# Patient Record
Sex: Male | Born: 1955 | Race: White | Hispanic: No | Marital: Married | Smoking: Current every day smoker
Health system: Southern US, Community
[De-identification: ages and names within clinical notes are randomized; demographics above are authoritative.]

## PROBLEM LIST (undated history)

## (undated) DIAGNOSIS — F329 Major depressive disorder, single episode, unspecified: Secondary | ICD-10-CM

## (undated) DIAGNOSIS — F32A Depression, unspecified: Secondary | ICD-10-CM

## (undated) DIAGNOSIS — J449 Chronic obstructive pulmonary disease, unspecified: Secondary | ICD-10-CM

## (undated) DIAGNOSIS — F419 Anxiety disorder, unspecified: Secondary | ICD-10-CM

## (undated) DIAGNOSIS — R12 Heartburn: Secondary | ICD-10-CM

## (undated) DIAGNOSIS — R569 Unspecified convulsions: Secondary | ICD-10-CM

## (undated) DIAGNOSIS — M1990 Unspecified osteoarthritis, unspecified site: Secondary | ICD-10-CM

## (undated) HISTORY — DX: Heartburn: R12

## (undated) HISTORY — DX: Major depressive disorder, single episode, unspecified: F32.9

## (undated) HISTORY — DX: Anxiety disorder, unspecified: F41.9

## (undated) HISTORY — DX: Unspecified osteoarthritis, unspecified site: M19.90

## (undated) HISTORY — DX: Unspecified convulsions: R56.9

## (undated) HISTORY — DX: Depression, unspecified: F32.A

---
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
## 2004-04-06 ENCOUNTER — Emergency Department: Payer: Self-pay | Admitting: Unknown Physician Specialty

## 2004-08-31 ENCOUNTER — Emergency Department: Payer: Self-pay | Admitting: Emergency Medicine

## 2005-09-21 ENCOUNTER — Emergency Department: Payer: Self-pay | Admitting: Emergency Medicine

## 2005-10-11 ENCOUNTER — Emergency Department: Payer: Self-pay | Admitting: Emergency Medicine

## 2006-01-29 ENCOUNTER — Emergency Department: Payer: Self-pay | Admitting: Emergency Medicine

## 2006-05-21 ENCOUNTER — Emergency Department: Payer: Self-pay

## 2007-09-03 ENCOUNTER — Emergency Department: Payer: Self-pay

## 2008-04-06 ENCOUNTER — Emergency Department: Payer: Self-pay | Admitting: Emergency Medicine

## 2009-08-06 ENCOUNTER — Emergency Department: Payer: Self-pay | Admitting: Emergency Medicine

## 2009-12-28 ENCOUNTER — Emergency Department: Payer: Self-pay | Admitting: Emergency Medicine

## 2010-03-15 ENCOUNTER — Emergency Department: Payer: Self-pay | Admitting: Unknown Physician Specialty

## 2011-01-21 LAB — COMPREHENSIVE METABOLIC PANEL
Anion Gap: 8 (ref 7–16)
BUN: 4 mg/dL — ABNORMAL LOW (ref 7–18)
Bilirubin,Total: 0.8 mg/dL (ref 0.2–1.0)
Chloride: 103 mmol/L (ref 98–107)
Co2: 26 mmol/L (ref 21–32)
Creatinine: 1.03 mg/dL (ref 0.60–1.30)
EGFR (African American): >60
EGFR (Non-African Amer.): >60
Glucose: 82 mg/dL (ref 65–99)
Osmolality: 270 (ref 275–301)
SGOT(AST): 40 U/L — ABNORMAL HIGH (ref 15–37)
SGPT (ALT): 32 U/L
Total Protein: 7.6 g/dL (ref 6.4–8.2)

## 2011-01-21 LAB — CBC
HCT: 48.9 % (ref 40.0–52.0)
MCV: 103 fL — ABNORMAL HIGH (ref 80–100)

## 2011-01-22 ENCOUNTER — Inpatient Hospital Stay: Payer: Self-pay | Admitting: Internal Medicine

## 2011-01-22 LAB — LIPID PANEL
Cholesterol: 126 mg/dL (ref 0–200)
Ldl Cholesterol, Calc: 53 mg/dL (ref 0–100)
VLDL Cholesterol, Calc: 18 mg/dL (ref 5–40)

## 2011-01-22 LAB — CK TOTAL AND CKMB (NOT AT ARMC)
CK, Total: 47 U/L (ref 35–232)
CK-MB: <0.5 ng/mL — ABNORMAL LOW (ref 0.5–3.6)
CK-MB: <0.5 ng/mL — ABNORMAL LOW (ref 0.5–3.6)

## 2011-01-22 LAB — HEMOGLOBIN A1C: Hemoglobin A1C: 5.2 % (ref 4.2–6.3)

## 2011-01-22 LAB — TSH: Thyroid Stimulating Horm: 2.55 u[IU]/mL

## 2011-07-15 ENCOUNTER — Emergency Department: Payer: Self-pay | Admitting: Emergency Medicine

## 2011-07-15 LAB — ETHANOL: Ethanol %: <0.003 % (ref 0.000–0.080)

## 2011-07-15 LAB — CBC
HCT: 45.7 %
HGB: 16 g/dL
MCH: 36.3 pg — ABNORMAL HIGH
MCHC: 34.9 g/dL
MCV: 104 fL — ABNORMAL HIGH
Platelet: 257 10*3/uL
RBC: 4.4 x10 6/mm 3
RDW: 12.6 %
WBC: 5.6 10*3/uL

## 2011-07-15 LAB — COMPREHENSIVE METABOLIC PANEL
Albumin: 3.8 g/dL (ref 3.4–5.0)
Alkaline Phosphatase: 87 U/L (ref 50–136)
Bilirubin,Total: 0.6 mg/dL (ref 0.2–1.0)
Chloride: 104 mmol/L (ref 98–107)
Co2: 29 mmol/L (ref 21–32)
Creatinine: 1.35 mg/dL — ABNORMAL HIGH (ref 0.60–1.30)
Potassium: 4.4 mmol/L (ref 3.5–5.1)
SGOT(AST): 29 U/L (ref 15–37)
SGPT (ALT): 22 U/L
Total Protein: 7.8 g/dL (ref 6.4–8.2)

## 2011-07-15 LAB — APTT: Activated PTT: 28.6 s

## 2011-07-15 LAB — PROTIME-INR
INR: 0.9
Prothrombin Time: 12 s

## 2011-09-03 ENCOUNTER — Emergency Department: Payer: Self-pay | Admitting: Internal Medicine

## 2012-05-15 ENCOUNTER — Ambulatory Visit: Payer: Self-pay

## 2012-12-17 ENCOUNTER — Emergency Department: Payer: Self-pay | Admitting: Emergency Medicine

## 2012-12-17 LAB — COMPREHENSIVE METABOLIC PANEL
Albumin: 3.4 g/dL (ref 3.4–5.0)
Alkaline Phosphatase: 102 U/L
Anion Gap: 6 — ABNORMAL LOW (ref 7–16)
BUN: 5 mg/dL — ABNORMAL LOW (ref 7–18)
Bilirubin,Total: 0.6 mg/dL (ref 0.2–1.0)
Calcium, Total: 9.5 mg/dL (ref 8.5–10.1)
Chloride: 99 mmol/L (ref 98–107)
Creatinine: 0.95 mg/dL (ref 0.60–1.30)
EGFR (Non-African Amer.): >60
Potassium: 3.3 mmol/L — ABNORMAL LOW (ref 3.5–5.1)
SGOT(AST): 34 U/L (ref 15–37)
SGPT (ALT): 20 U/L (ref 12–78)

## 2012-12-17 LAB — CBC WITH DIFFERENTIAL/PLATELET
Eosinophil #: 0 10*3/uL (ref 0.0–0.7)
Eosinophil %: 0.3 %
HGB: 15.8 g/dL (ref 13.0–18.0)
Lymphocyte #: 0.5 10*3/uL — ABNORMAL LOW (ref 1.0–3.6)
Lymphocyte %: 5.8 %
Monocyte #: 0.6 x10 3/mm (ref 0.2–1.0)
Monocyte %: 7.6 %
Neutrophil #: 6.7 10*3/uL — ABNORMAL HIGH (ref 1.4–6.5)
WBC: 7.8 10*3/uL (ref 3.8–10.6)

## 2012-12-17 LAB — URINALYSIS, COMPLETE
Bilirubin,UR: NEGATIVE
Ketone: NEGATIVE
Nitrite: NEGATIVE
Ph: 5 (ref 4.5–8.0)
Protein: NEGATIVE
RBC,UR: <1 /HPF (ref 0–5)
Specific Gravity: 1.018 (ref 1.003–1.030)
Squamous Epithelial: NONE SEEN
WBC UR: 1 /HPF (ref 0–5)

## 2013-11-11 ENCOUNTER — Emergency Department: Payer: Self-pay | Admitting: Emergency Medicine

## 2013-11-11 LAB — BASIC METABOLIC PANEL
Anion Gap: 6 — ABNORMAL LOW (ref 7–16)
BUN: 3 mg/dL — ABNORMAL LOW (ref 7–18)
CREATININE: 0.87 mg/dL (ref 0.60–1.30)
Calcium, Total: 8.5 mg/dL (ref 8.5–10.1)
Chloride: 106 mmol/L (ref 98–107)
Co2: 26 mmol/L (ref 21–32)
EGFR (African American): >60
GLUCOSE: 96 mg/dL (ref 65–99)
Osmolality: 272 (ref 275–301)
Potassium: 4.1 mmol/L (ref 3.5–5.1)
Sodium: 138 mmol/L (ref 136–145)

## 2013-11-11 LAB — CBC
HCT: 44.6 % (ref 40.0–52.0)
HGB: 15 g/dL (ref 13.0–18.0)
MCH: 35.6 pg — ABNORMAL HIGH (ref 26.0–34.0)
MCHC: 33.6 g/dL (ref 32.0–36.0)
MCV: 106 fL — ABNORMAL HIGH (ref 80–100)
PLATELETS: 332 10*3/uL (ref 150–440)
RBC: 4.22 10*6/uL — ABNORMAL LOW (ref 4.40–5.90)
RDW: 12.4 % (ref 11.5–14.5)
WBC: 6.5 10*3/uL (ref 3.8–10.6)

## 2014-04-25 NOTE — Discharge Summary (Signed)
PATIENT NAME:  Marco [name], Marco [name] MR#:  [id number] DATE OF BIRTH:  12/12/1955  DATE OF ADMISSION:  01/22/2011 DATE OF DISCHARGE:  01/22/2011  DISCHARGE DIAGNOSES:  1. Transient visual loss, unlikely due to transient ischemic attack or cerebrovascular accident, all neuro work-up being negative. Vision back to normal. This could be due to underlying alcoholism, but cannot rule out underlying ophthalmological issues. Will require outpatient eye evaluation.  2. Tobacco/alcohol. The patient was counseled for about three minutes for tobacco cessation. He not ready to quit yet.   SECONDARY DIAGNOSIS:  Tobacco and alcohol abuse.   CONSULTATIONS: None.   PROCEDURES/RADIOLOGY:  1. CT scan of the head without contrast on 01/20 showed no acute abnormality.  2. MRI of the brain on 01/21 showed no acute intracranial pathology.  3. Carotid Doppler's bilaterally on 01/21 showed no hemodynamically significant stenosis.   Vitamin B12 level was within normal limits.   HISTORY AND SHORT HOSPITAL COURSE: The patient is a 59 year old male with the above-mentioned medical problems who was admitted for transient visual loss which resolved while in the hospital. He did not have any more blurry or double vision. He underwent complete neurological work-up including CT scan of the head, MRI of the brain, and carotid Doppler's. The patient refused echocardiogram. All the work-up remained negative. The patient was requesting a work excuse note, which was given to him for his days in the hospital.  On 01/21 in the morning he was doing much better and was back to his normal status. His lipid profile was also excellent. He did have very well controlled LDL.  He had two negative sets of cardiac enzymes, normal TSH, and remained hemodynamically stable. On the date of discharge his vital signs were as follows: Temperature 98.2, heart rate 83 per minute, respirations 20 per minute, blood pressure 141/80 mmHg.  He was saturating 95% on  room air.   PERTINENT PHYSICAL EXAMINATION: CARDIOVASCULAR: S1, S2 normal. No murmur, rubs, or gallop. LUNGS: Clear to auscultation bilaterally. No wheezing, rales, rhonchi, or crepitation. ABDOMEN: Soft, benign. NEUROLOGIC: Nonfocal examination. All other physical examination remained at baseline.   DISCHARGE MEDICATIONS: Aspirin 81 mg p.o. daily.   DISCHARGE DIET: Low sodium, low cholesterol.   DISCHARGE ACTIVITY: As tolerated.   DISCHARGE INSTRUCTIONS AND FOLLOWUP:  1. The patient was instructed to follow up with a new primary care physician at the [facility] in 1 to 2 weeks.  2. He will need followup with [facility] physician in 1 to 2 weeks.  3. He will also need followup with [facility] in 3 to 4 weeks.   TOTAL TIME DISCHARGING THIS PATIENT: 45 minutes.    ____________________________ Ellamae SiaVipul S. Sherryll BurgerShah, MD vss:bjt D: 01/23/2011 14:55:27 ET T: 01/24/2011 11:50:54 ET JOB#: [id number]  cc: Greysyn Vanderberg S. Sherryll BurgerShah, MD, <Dictator> [facility]  [facility] Ellamae SiaVIPUL S [facility] MD ELECTRONICALLY SIGNED 01/24/2011 23:14

## 2014-04-25 NOTE — Consult Note (Signed)
Brief Consult Note: Diagnosis: Alcoholism.   Comments: Mr. Yetta FlockHodges was discharged before seen by the consultant.  Electronic Signatures: Kristine LineaPucilowska, Nevena Rozenberg (MD)  (Signed 21-Jan-13 14:58)  Authored: Brief Consult Note   Last Updated: 21-Jan-13 14:58 by Kristine LineaPucilowska, Jasmane Brockway (MD)

## 2014-04-25 NOTE — H&P (Signed)
PATIENT NAME:  Marco [name], Marco [name] MR#:  [id number] DATE OF BIRTH:  1955/07/08  DATE OF ADMISSION:  01/22/2011  REFERRING PHYSICIAN: Dr. Dolores FrameSung   PRIMARY CARE PHYSICIAN: None   PRESENTING COMPLAINT: Blurry vision with progression to double vision.   HISTORY OF PRESENT ILLNESS: Marco [name] is a 59 year old gentleman with no prior diagnosis, history of tobacco and alcohol abuse who presents today with reports of developing acute onset of blurry vision while he was watching a football game. He looked at his computer screen and could not even read a large type headline. He presented to the Emergency Room and his symptoms began to progress to developing double vision.  He reports that currently his vision is at baseline. When asked about prior events, the patient reports back in November 2012 he had two separate events, both times lasting approximately two hours but did not develop the double  vision. This time his symptoms lasted longer. He denies any chest pain or shortness of breath. He does endorse some lightheadedness. No dizziness or syncope.   PAST MEDICAL HISTORY:  1. Tobacco abuse.  2. Alcohol abuse.   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   ALLERGIES: Questionable pain medication allergy.   MEDICATIONS: None.   FAMILY HISTORY: Diabetes and alcoholism.   SOCIAL HISTORY: He lives in [place] with his girlfriend, smokes 1/2 pack per day for the past 30 years. He drinks eight beers per day for the past 35 years. Denies any drug use. He works at [employer].  REVIEW OF SYSTEMS: CONSTITUTIONAL: No fevers, chills, nausea or vomiting. EYES: As per history of present illness. ENT: No epistaxis, discharge, or dysphagia. RESPIRATORY: No cough, wheezing, or hemoptysis. CARDIOVASCULAR: No chest pain, orthopnea, edema, palpitations, or syncope. GI: No nausea, vomiting, diarrhea,  abdominal pain, hematemesis, or melena. GU: No dysuria or hematuria. ENDO: No polyuria or polydipsia. HEMATOLOGIC: No easy bleeding. SKIN: No ulcers. MUSCULOSKELETAL: No joint pain or swelling. NEUROLOGIC: No dysarthria or aphasia. He denies any one-sided weakness or numbness. No slurred speech or drooping or drooling.  PSYCH: Denies any depression or suicidal ideation.   PHYSICAL EXAMINATION:  VITAL SIGNS: Temperature 96.2, pulse 94, respiratory rate 20, blood pressure 162/90, saturating 98% on room air.   GENERAL: Lying in bed in no apparent distress.   HEENT: Normocephalic, atraumatic. Pupils are equal, symmetric, anicteric. Extraocular movements intact. No nystagmus. Nares without discharge. Oropharynx without erythema or exudate. Moist mucous membrane.   NECK: Soft and supple. No adenopathy or JVP. No carotid bruits bilaterally.   CARDIOVASCULAR: Non-tachy. No murmurs, rubs, or gallops.   LUNGS: Clear to auscultation bilaterally. No use of accessory muscles or increased respiratory effort.   ABDOMEN: Soft, nontender, positive bowel sounds. No mass appreciated.   EXTREMITIES: No edema. Dorsal pedis pulses intact.   MUSCULOSKELETAL: No joint effusion.   SKIN: No ulcers.   NEUROLOGIC: No dysarthria or aphasia. Symmetrical strength. Nose to finger intact. No pronator drift or asterixis.   PSYCH: The patient is alert and oriented and cooperative.   PERTINENT LABS AND STUDIES: WBC 6.4, hemoglobin 17.3, hematocrit 48.9, platelets 266, MCV 103, glucose 82, BUN 4, creatinine 1.03, sodium 137, potassium 4.2, chloride 103, carbon dioxide 26, calcium 9.1. LFTs with elevated AST of 40. Troponin less than 0.02. Alcohol level is less than 3. EKG with sinus rate of 70s. No ST changes. He has T-wave inversion in V1 and V2. CT  of the head without any acute findings.   ASSESSMENT AND PLAN: Marco [name] is a 59 year old gentleman with a history of tobacco abuse and alcohol abuse, not followed by primary,  presenting with visual disturbance.  1. Presumed transient ischemic attack/cerebrovascular accident. Start on aspirin. We will get MRI, carotid Dopplers, and echocardiogram. Send TSH, fasting lipid panel, and A1c.  2. Tobacco abuse. Nicotrol inhaler.  3. Alcohol abuse. Start CIWA protocol, obtain urine drug screen, and get substance abuse counselor.  4. Macrocytosis, likely in the setting of alcohol. We will send folate and B12.  5. Prophylaxis with Protonix and encourage ambulation.   TIME SPENT: Approximately 45 minutes spent on patient care.   ____________________________ Reuel Derby, MD ap:bjt D: 01/22/2011 01:38:20 ET T: 01/22/2011 10:09:25 ET JOB#: [id number]  cc: Pearlean Brownie Tanishi Nault, MD, <Dictator> Reuel Derby MD ELECTRONICALLY SIGNED 02/03/2011 23:26

## 2015-06-08 ENCOUNTER — Encounter: Payer: Self-pay | Admitting: Urology

## 2015-06-08 ENCOUNTER — Ambulatory Visit (INDEPENDENT_AMBULATORY_CARE_PROVIDER_SITE_OTHER): Payer: Medicare Other | Admitting: Urology

## 2015-06-08 VITALS — BP 137/78 | HR 91 | Ht 65.0 in | Wt 160.7 lb

## 2015-06-08 DIAGNOSIS — N401 Enlarged prostate with lower urinary tract symptoms: Secondary | ICD-10-CM | POA: Diagnosis not present

## 2015-06-08 DIAGNOSIS — N400 Enlarged prostate without lower urinary tract symptoms: Secondary | ICD-10-CM

## 2015-06-08 DIAGNOSIS — R3911 Hesitancy of micturition: Secondary | ICD-10-CM | POA: Diagnosis not present

## 2015-06-08 DIAGNOSIS — N138 Other obstructive and reflux uropathy: Secondary | ICD-10-CM

## 2015-06-08 DIAGNOSIS — N402 Nodular prostate without lower urinary tract symptoms: Secondary | ICD-10-CM | POA: Diagnosis not present

## 2015-06-08 LAB — BLADDER SCAN AMB NON-IMAGING: SCAN RESULT: 18

## 2015-06-08 NOTE — Progress Notes (Signed)
06/08/2015 3:47 PM   Marco RegisterMichael [name] 1955/07/07 [id number]  Referring provider: No referring provider defined for this encounter.  Chief Complaint  Patient presents with  . Urinary hesitancy    referred by Dr.  Burnett ShengHedrick  . prostate nodule    HPI: Patient is a 60 year old Caucasian male who presents today as a referral from his primary care physician Dr. Burnett ShengHedrick for urinary hesitancy and a prostate nodule found on exam.  Patient states that for the last month, he has been experiencing postvoid dribbling, intermittency and hesitancy.  He was given in 2 weeks of an antibiotic and started on Flomax by his PCP.  He states his symptoms have been improving since he was initiated on the tamsulosin.  He has not been experiencing dysuria, gross hematuria or suprapubic pain.  His I PSS score today was 15/2. His PVR was 18 mL  He does not have a family history of prostate cancer.  He is not experiencing weight loss or bone pain. He has not had recent fevers, chills, nausea or vomiting.      IPSS      06/08/15 1500       International Prostate Symptom Score   How often have you had the sensation of not emptying your bladder? About half the time     How often have you had to urinate less than every two hours? More than half the time     How often have you found you stopped and started again several times when you urinated? Less than half the time     How often have you found it difficult to postpone urination? Not at All     How often have you had a weak urinary stream? Less than half the time     How often have you had to strain to start urination? Less than 1 in 5 times     How many times did you typically get up at night to urinate? 3 Times     Total IPSS Score 15     Quality of Life due to urinary symptoms   If you were to spend the rest of your life with your urinary condition just the way it is now how would you feel about that? Mostly Satisfied        Score:  1-7 Mild 8-19  Moderate 20-35 Severe   PMH: Past Medical History  Diagnosis Date  . Heartburn   . Anxiety   . Arthritis   . Depression   . Seizures [facility](HCC)     Surgical History: Past Surgical History  Procedure Laterality Date  . Knee arthroscopy  1975    Home Medications:    Medication List       This list is accurate as of: 06/08/15  3:47 PM.  Always use your most recent med list.               FLUoxetine 20 MG capsule  Commonly known as:  PROZAC  Take by mouth.     levETIRAcetam 500 MG tablet  Commonly known as:  KEPPRA  Take by mouth.     sildenafil 20 MG tablet  Commonly known as:  REVATIO     sulfamethoxazole-trimethoprim 800-160 MG tablet  Commonly known as:  BACTRIM DS,SEPTRA DS  Take 1 tablet by mouth 2 (two) times daily.     tamsulosin 0.4 MG Caps capsule  Commonly known as:  FLOMAX  Take by mouth. Reported on 06/08/2015  Allergies:  Allergies  Allergen Reactions  . Hydrocodone-Acetaminophen Nausea Only and Nausea And Vomiting    Family History: Family History  Problem Relation Age of Onset  . Kidney disease Neg Hx   . Prostate cancer Neg Hx     Social History:  reports that he has been smoking.  He does not have any smokeless tobacco history on file. He reports that he drinks alcohol. He reports that he does not use illicit drugs.  ROS: UROLOGY Frequent Urination?: No Hard to postpone urination?: No Burning/pain with urination?: No Get up at night to urinate?: No Leakage of urine?: Yes Urine stream starts and stops?: Yes Trouble starting stream?: Yes Do you have to strain to urinate?: No Blood in urine?: No Urinary tract infection?: No Sexually transmitted disease?: No Injury to kidneys or bladder?: No Painful intercourse?: No Weak stream?: No Erection problems?: No Penile pain?: No  Gastrointestinal Nausea?: No Vomiting?: No Indigestion/heartburn?: No Diarrhea?: No Constipation?: No  Constitutional Fever: No Night sweats?:  No Weight loss?: No Fatigue?: No  Skin Skin rash/lesions?: No Itching?: No  Eyes Blurred vision?: Yes Double vision?: No  Ears/Nose/Throat Sore throat?: No Sinus problems?: No  Hematologic/Lymphatic Swollen glands?: No Easy bruising?: No  Cardiovascular Leg swelling?: No Chest pain?: No  Respiratory Cough?: No Shortness of breath?: No  Endocrine Excessive thirst?: No  Musculoskeletal Back pain?: No Joint pain?: No  Neurological Headaches?: No Dizziness?: No  Psychologic Depression?: No Anxiety?: No  Physical Exam: BP 137/78 mmHg  Pulse 91  Ht 5\' 5"  (1.651 m)  Wt 160 lb 11.2 oz (72.893 kg)  BMI 26.74 kg/m2  Constitutional: Well nourished. Alert and oriented, No acute distress. HEENT: [place] AT, moist mucus membranes. Trachea midline, no masses. Cardiovascular: No clubbing, cyanosis, or edema. Respiratory: Normal respiratory effort, no increased work of breathing. GI: Abdomen is soft, non tender, non distended, no abdominal masses. Liver and spleen not palpable.  No hernias appreciated.  Stool sample for occult testing is not indicated.   GU: No CVA tenderness.  No bladder fullness or masses.  Patient with uncircumcised phallus. Foreskin easily retracted Urethral meatus is patent.  No penile discharge. No penile lesions or rashes. Scrotum without lesions, cysts, rashes and/or edema.  Testicles are located scrotally bilaterally. No masses are appreciated in the testicles. Left and right epididymis are normal. Rectal: Patient with  normal sphincter tone. Anus and perineum without scarring or rashes. No rectal masses are appreciated. Prostate is approximately 50 grams, calcification is noted in the mid prostate on the left lobe near the midline, no nodules are appreciated. Seminal vesicles are normal. Skin: No rashes, bruises or suspicious lesions. Lymph: No cervical or inguinal adenopathy. Neurologic: Grossly intact, no focal deficits, moving all 4  extremities. Psychiatric: Normal mood and affect.  Laboratory Data: Lab Results  Component Value Date   WBC 6.5 11/11/2013   HGB 15.0 11/11/2013   HCT 44.6 11/11/2013   MCV 106* 11/11/2013   PLT 332 11/11/2013    Lab Results  Component Value Date   CREATININE 0.87 11/11/2013     Lab Results  Component Value Date   HGBA1C 5.2 01/22/2011    Lab Results  Component Value Date   TSH 2.55 01/22/2011       Component Value Date/Time   CHOL 126 01/22/2011 0446   HDL 55 01/22/2011 0446   VLDL 18 01/22/2011 0446   LDLCALC 53 01/22/2011 0446    Lab Results  Component Value Date   AST 34 12/17/2012  Lab Results  Component Value Date   ALT 20 12/17/2012     Pertinent Imaging: Results for Marco [name], Marco [name] (MRN [id number]) as of 06/08/2015 15:47  Ref. Range 06/08/2015 15:35  Scan Result Unknown 18    Assessment & Plan:    1. BPH (benign prostatic hyperplasia) with LUTS:     - IPSS score is 15/2    - Continue tamsulosin 0.4 mg daily  - RTC in 3 months for IPSS and exam    - PSA - BLADDER SCAN AMB NON-IMAGING  2. Urinary hesitancy:  Continue the tamsulosin.  He will be RTC in 3 months for recheck.  3. Prostate nodule:   Calculus vs nodule.  He will RTC in 3 months for exam to ensure stability.     Return in about 3 months (around 09/08/2015) for exam and IPSS.  These notes generated with voice recognition software. I apologize for typographical errors.  Michiel Cowboy, PA-C  [facility] [address], [address] [place], [place] [address] [phone]

## 2015-06-09 LAB — PSA: PROSTATE SPECIFIC AG, SERUM: 1.6 ng/mL (ref 0.0–4.0)

## 2015-06-10 ENCOUNTER — Telehealth: Payer: Self-pay

## 2015-06-10 NOTE — Telephone Encounter (Signed)
Spoke with pt wife in reference to PSA results and f/u in September. Wife voiced understanding.

## 2015-06-10 NOTE — Telephone Encounter (Signed)
-----   Message from Harle BattiestShannon A McGowan, PA-C sent at 06/09/2015  8:12 AM EDT ----- PSA is stable.  We will see him in September.

## 2015-07-23 ENCOUNTER — Encounter: Payer: Self-pay | Admitting: Emergency Medicine

## 2015-07-23 ENCOUNTER — Emergency Department
Admission: EM | Admit: 2015-07-23 | Discharge: 2015-07-23 | Disposition: A | Discharge status: Home / Self Care | Payer: Medicare Other | Attending: Emergency Medicine | Admitting: Emergency Medicine

## 2015-07-23 DIAGNOSIS — H6992 Unspecified Eustachian tube disorder, left ear: Secondary | ICD-10-CM | POA: Insufficient documentation

## 2015-07-23 DIAGNOSIS — H6692 Otitis media, unspecified, left ear: Secondary | ICD-10-CM | POA: Insufficient documentation

## 2015-07-23 DIAGNOSIS — H6982 Other specified disorders of Eustachian tube, left ear: Secondary | ICD-10-CM

## 2015-07-23 MED ORDER — AMOXICILLIN 500 MG PO TABS: 500.0000 mg | ORAL_TABLET | Freq: Three times a day (TID) | ORAL | Status: DC

## 2015-07-23 MED ORDER — FLUTICASONE PROPIONATE 50 MCG/ACT NA SUSP: 2.0000 | Freq: Every day | NASAL | Status: DC

## 2015-07-23 NOTE — ED Provider Notes (Signed)
[facility] Emergency Department Provider Note ____________________________________________  Time seen: Approximately 6:49 PM  I have reviewed the triage vital signs and the nursing notes.   HISTORY  Chief Complaint Otitis Media    HPI Marco [name] is a 60 y.o. male who presents to the emergency department for evaluation of the left ear pain and decrease in hearing. He states that symptoms started approximately 10 days ago. 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 He has been using Murine over-the-counter without relief.  Past Medical History  Diagnosis Date  . Heartburn   . Anxiety   . Arthritis   . Depression   . Seizures (HCC)     There are no active problems to display for this patient.   Past Surgical History  Procedure Laterality Date  . Knee arthroscopy  1975    Current Outpatient Rx  Name  Route  Sig  Dispense  Refill  . amoxicillin (AMOXIL) 500 MG tablet   Oral   Take 1 tablet (500 mg total) by mouth 3 (three) times daily.   30 tablet   0   . FLUoxetine (PROZAC) 20 MG capsule   Oral   Take by mouth.         . fluticasone (FLONASE) 50 MCG/ACT nasal spray   Each Nare   Place 2 sprays into both nostrils daily.   16 g   0   . levETIRAcetam (KEPPRA) 500 MG tablet   Oral   Take by mouth.         . sildenafil (REVATIO) 20 MG tablet               . sulfamethoxazole-trimethoprim (BACTRIM DS,SEPTRA DS) 800-160 MG tablet   Oral   Take 1 tablet by mouth 2 (two) times daily.         . tamsulosin (FLOMAX) 0.4 MG CAPS capsule   Oral   Take by mouth. Reported on 06/08/2015           Allergies Hydrocodone-acetaminophen  Family History  Problem Relation Age of Onset  . Kidney disease Neg Hx   . Prostate cancer Neg Hx     Social History Social History  Substance Use Topics  . Smoking status: Current Every Day Smoker  . Smokeless tobacco: None   . Alcohol Use: 0.0 oz/week    0 Standard drinks or equivalent per week    Review of Systems Constitutional: Negative for fever/chills Eyes: No visual changes. ENT: Positive for left earache. Gastrointestinal: No abdominal pain.  No nausea, no vomiting.  No diarrhea.  No constipation. Musculoskeletal: Negative for pain. Skin: Negative for rash. Neurological: Negative for headaches, focal weakness or numbness. ____________________________________________   PHYSICAL EXAM:  VITAL SIGNS: ED Triage Vitals  Enc Vitals Group     BP 07/23/15 1828 143/74 mmHg     Pulse Rate 07/23/15 1828 96     Resp 07/23/15 1828 20     Temp 07/23/15 1828 98.1 F (36.7 C)     Temp Source 07/23/15 1828 Oral     SpO2 07/23/15 1828 97 %     Weight 07/23/15 1828 165 lb (74.844 kg)     Height 07/23/15 1828  (1.651 m)     Head Cir --      Peak Flow --      Pain Score 07/23/15 1906 5     Pain Loc --  Pain Edu? --      Excl. in GC? --     Constitutional: Alert and oriented. Well appearing and in no acute distress. Eyes: Conjunctivae are normal. PERRL. EOMI. Ears: No pain with movement of either auricle:; External canal patent;  Right TM normal; left TM partially contracted and partially very erythematous and bulging.   Head: Atraumatic. Nose: No congestion/rhinnorhea. Mouth/Throat: Mucous membranes are moist.  Oropharynx non-erythematous. Hematological/Lymphatic/Immunilogical: No cervical lymphadenopathy. Cardiovascular: Normal capillary refill. Respiratory: Normal respiratory effort.  No retractions.  Neurologic:  Normal speech and language. No gross focal neurologic deficits are appreciated. Speech is normal. No gait instability. Skin:  Skin is warm, dry and intact. No rash noted. ____________________________________________   LABS (all labs ordered are listed, but only abnormal results are displayed)  Labs Reviewed - No data to  display ____________________________________________   RADIOLOGY  Not indicated ____________________________________________   PROCEDURES  Procedure(s) performed: None  ____________________________________________   INITIAL IMPRESSION / ASSESSMENT AND PLAN / ED COURSE  Pertinent labs & imaging results that were available during my care of the patient were reviewed by me and considered in my medical decision making (see chart for details).  Prescriptions for amoxicillin and fluticasone nasal spray will be given today.  The patient was advised to follow up with ENT for symptoms that are not improving over the next 7 days. He was advised to return to the emergency department for symptoms that change or worsen if unable to schedule an appointment.  ____________________________________________   FINAL CLINICAL IMPRESSION(S) / ED DIAGNOSES  Final diagnoses:  Eustachian tube dysfunction, left  Acute left otitis media, recurrence not specified, unspecified otitis media type    Note:  This document was prepared using Dragon voice recognition software and may include unintentional dictation errors.     Chinita Pester, FNP 07/23/15 1919  Phineas Semen, MD 07/23/15 [phone]

## 2015-07-23 NOTE — ED Notes (Addendum)
Patient states he has not been able to hear out of his left ear for 10 days. Patient states that he was at his doctor's for an unrelated reason and the MD stated that he was impacted with cerumen. MD unable to wash out his ear because "it was closing time". Patient was instructed to get Murine but it has not resolved.

## 2015-09-08 ENCOUNTER — Ambulatory Visit: Payer: Medicare Other | Admitting: Urology

## 2015-09-20 ENCOUNTER — Ambulatory Visit: Payer: Medicare Other | Admitting: Urology

## 2016-05-28 ENCOUNTER — Emergency Department: Payer: Medicare Other

## 2016-05-28 ENCOUNTER — Emergency Department
Admission: EM | Admit: 2016-05-28 | Discharge: 2016-05-28 | Disposition: A | Discharge status: Home / Self Care | Payer: Medicare Other | Attending: Emergency Medicine | Admitting: Emergency Medicine

## 2016-05-28 DIAGNOSIS — J40 Bronchitis, not specified as acute or chronic: Secondary | ICD-10-CM

## 2016-05-28 DIAGNOSIS — F17200 Nicotine dependence, unspecified, uncomplicated: Secondary | ICD-10-CM | POA: Diagnosis not present

## 2016-05-28 DIAGNOSIS — R0602 Shortness of breath: Secondary | ICD-10-CM | POA: Diagnosis present

## 2016-05-28 LAB — COMPREHENSIVE METABOLIC PANEL
ALT: 15 U/L — ABNORMAL LOW (ref 17–63)
ANION GAP: 10 (ref 5–15)
AST: 31 U/L (ref 15–41)
Albumin: 3.5 g/dL (ref 3.5–5.0)
Alkaline Phosphatase: 76 U/L (ref 38–126)
BILIRUBIN TOTAL: 0.6 mg/dL (ref 0.3–1.2)
BUN: <5 mg/dL — ABNORMAL LOW (ref 6–20)
CHLORIDE: 97 mmol/L — AB (ref 101–111)
CO2: 23 mmol/L (ref 22–32)
Calcium: 8.8 mg/dL — ABNORMAL LOW (ref 8.9–10.3)
Creatinine, Ser: 0.87 mg/dL (ref 0.61–1.24)
Glucose, Bld: 105 mg/dL — ABNORMAL HIGH (ref 65–99)
POTASSIUM: 3.8 mmol/L (ref 3.5–5.1)
Sodium: 130 mmol/L — ABNORMAL LOW (ref 135–145)
TOTAL PROTEIN: 7 g/dL (ref 6.5–8.1)

## 2016-05-28 LAB — CBC WITH DIFFERENTIAL/PLATELET
BASOS ABS: 0.1 10*3/uL (ref 0–0.1)
Basophils Relative: 1 %
EOS PCT: 2 %
Eosinophils Absolute: 0.1 10*3/uL (ref 0–0.7)
HEMATOCRIT: 44.4 % (ref 40.0–52.0)
Hemoglobin: 15.5 g/dL (ref 13.0–18.0)
LYMPHS PCT: 25 %
Lymphs Abs: 2 10*3/uL (ref 1.0–3.6)
MCH: 36.5 pg — ABNORMAL HIGH (ref 26.0–34.0)
MCHC: 34.9 g/dL (ref 32.0–36.0)
MCV: 104.4 fL — AB (ref 80.0–100.0)
MONO ABS: 0.9 10*3/uL (ref 0.2–1.0)
MONOS PCT: 11 %
NEUTROS ABS: 5.2 10*3/uL (ref 1.4–6.5)
Neutrophils Relative %: 61 %
PLATELETS: 250 10*3/uL (ref 150–440)
RBC: 4.25 MIL/uL — ABNORMAL LOW (ref 4.40–5.90)
RDW: 13.2 % (ref 11.5–14.5)
WBC: 8.3 10*3/uL (ref 3.8–10.6)

## 2016-05-28 LAB — TROPONIN I

## 2016-05-28 MED ORDER — ALBUTEROL SULFATE (2.5 MG/3ML) 0.083% IN NEBU
INHALATION_SOLUTION | RESPIRATORY_TRACT | Status: AC
  Filled 2016-05-28: qty 3

## 2016-05-28 MED ORDER — IPRATROPIUM-ALBUTEROL 0.5-2.5 (3) MG/3ML IN SOLN
3.0000 mL | Freq: Once | RESPIRATORY_TRACT | Status: AC
  Administered 2016-05-28: 3 mL via RESPIRATORY_TRACT
  Filled 2016-05-28: qty 3

## 2016-05-28 MED ORDER — METHYLPREDNISOLONE SODIUM SUCC 125 MG IJ SOLR
125.0000 mg | Freq: Once | INTRAMUSCULAR | Status: AC
  Administered 2016-05-28: 125 mg via INTRAVENOUS
  Filled 2016-05-28: qty 2

## 2016-05-28 MED ORDER — AZITHROMYCIN 500 MG PO TABS
500.0000 mg | ORAL_TABLET | Freq: Once | ORAL | Status: AC
  Administered 2016-05-28: 500 mg via ORAL
  Filled 2016-05-28: qty 1

## 2016-05-28 MED ORDER — ALBUTEROL SULFATE (2.5 MG/3ML) 0.083% IN NEBU
5.0000 mg | INHALATION_SOLUTION | Freq: Once | RESPIRATORY_TRACT | Status: AC
  Administered 2016-05-28: 5 mg via RESPIRATORY_TRACT
  Filled 2016-05-28: qty 3

## 2016-05-28 MED ORDER — AZITHROMYCIN 250 MG PO TABS: ORAL_TABLET | ORAL | 0 refills | Status: AC

## 2016-05-28 NOTE — ED Triage Notes (Signed)
Pt reports that 3 weeks ago he started with a cough that is productive at times - pt reports congestion and runny nose - he reports that he is short of breath at times - pt after coughing is noted to have wheezing on expirations but once he clears the congestion his lung sounds are slightly diminished but no wheezing is heard

## 2016-05-28 NOTE — ED Notes (Signed)
Pt discharged to home.  Family member driving.  Discharge instructions reviewed.  Verbalized understanding.  No questions or concerns at this time.  Teach back verified.  Pt in NAD.  No items left in ED.   

## 2016-05-28 NOTE — ED Notes (Signed)
FIRST NURSE NOTE:  Reports shortness of breath x 2 weeks states he thinks he has bronchitis. Ambulatory in the lobby, able to speak in full sentences without running out of breath. States he gets bronchitis once per year and feels the same.

## 2016-05-28 NOTE — ED Provider Notes (Signed)
[facility] Emergency Department Provider Note   ____________________________________________    I have reviewed the triage vital signs and the nursing notes.   HISTORY  Chief Complaint Shortness of Breath and Cough     HPI Marco RegisterMichael Musgrave is a 61 y.o. male who presents with complaints of mild shortness of breath but primarily he is concerned about a cough which she has had for 3 weeks. He reports the cough is much worse at night. He reports he does smoke cigarettes. He denies fevers or chills. No recent travel. No nausea or vomiting or chest pain. No pleurisy. No calf pain or swelling. Cough is nonproductive. He reports he gets bronchitis every year and this feels similar   Past Medical History:  Diagnosis Date  . Anxiety   . Arthritis   . Depression   . Heartburn   . Seizures (HCC)     There are no active problems to display for this patient.   Past Surgical History:  Procedure Laterality Date  . KNEE ARTHROSCOPY  1975    Prior to Admission medications   Medication Sig Start Date End Date Taking? Authorizing Provider  amoxicillin (AMOXIL) 500 MG tablet Take 1 tablet (500 mg total) by mouth 3 (three) times daily. 07/23/15   Triplett, Rulon Eisenmengerari B, FNP  azithromycin (ZITHROMAX Z-PAK) 250 MG tablet Take 2 tablets (500 mg) on  Day 1,  followed by 1 tablet (250 mg) once daily on Days 2 through 5. 05/28/16 06/02/16  Jene EveryKinner, Jefferey Lippmann, MD  FLUoxetine (PROZAC) 20 MG capsule Take by mouth. 05/11/15 05/10/16  [provider]  fluticasone (FLONASE) 50 MCG/ACT nasal spray Place 2 sprays into both nostrils daily. 07/23/15 07/22/16  Triplett, Rulon Eisenmengerari B, FNP  levETIRAcetam (KEPPRA) 500 MG tablet Take by mouth. 01/20/15   [provider]  sildenafil (REVATIO) 20 MG tablet  06/18/14   [provider]  sulfamethoxazole-trimethoprim (BACTRIM DS,SEPTRA DS) 800-160 MG tablet Take 1 tablet by mouth 2 (two) times daily.    [provider]      Allergies Hydrocodone-acetaminophen  Family History  Problem Relation Age of Onset  . Kidney disease Neg Hx   . Prostate cancer Neg Hx     Social History Social History  Substance Use Topics  . Smoking status: Current Every Day Smoker    Packs/day: 1.00  . Smokeless tobacco: Never Used  . Alcohol use 2.4 oz/week    4 Cans of beer per week    Review of Systems  Constitutional: No fever/chills Eyes: No visual changes.  ENT: No sore throat. Cardiovascular: Denies chest pain. Respiratory:As above Gastrointestinal: No abdominal pain.  No nausea, no vomiting.   Genitourinary: Negative for dysuria. Musculoskeletal: Negative for back pain. Skin: Negative for rash. Neurological: Negative for headaches    ____________________________________________   PHYSICAL EXAM:  VITAL SIGNS: ED Triage Vitals  Enc Vitals Group     BP 05/28/16 1841 133/77     Pulse Rate 05/28/16 1841 (!) 114     Resp 05/28/16 1841 (!) 22     Temp 05/28/16 1841 98.2 F (36.8 C)     Temp Source 05/28/16 1841 Oral     SpO2 05/28/16 1841 98 %     Weight 05/28/16 1842 72.6 kg (160 lb)     Height 05/28/16 1842 1.676 m (5\' 6" )     Head Circumference --      Peak Flow --      Pain Score 05/28/16 1841 0  Pain Loc --      Pain Edu? --      Excl. in GC? --     Constitutional: Alert and oriented. No acute distress. Pleasant and interactive Eyes: Conjunctivae are normal.  Head: Atraumatic. Nose: No congestion/rhinnorhea. Mouth/Throat: Mucous membranes are moist.   Neck:  Painless ROM Cardiovascular: Normal rate, regular rhythm. Grossly normal heart sounds.  Good peripheral circulation. Respiratory: Normal respiratory effort.  No retractions. Scattered mild wheezes Gastrointestinal: Soft and nontender. No distention.  No CVA tenderness. Genitourinary: deferred Musculoskeletal: No lower extremity tenderness nor edema.  Warm and well perfused Neurologic:  Normal speech and language. No gross focal  neurologic deficits are appreciated.  Skin:  Skin is warm, dry and intact. No rash noted. Psychiatric: Mood and affect are normal. Speech and behavior are normal.  ____________________________________________   LABS (all labs ordered are listed, but only abnormal results are displayed)  Labs Reviewed  CBC WITH DIFFERENTIAL/PLATELET - Abnormal; Notable for the following:       Result Value   RBC 4.25 (*)    MCV 104.4 (*)    MCH 36.5 (*)    All other components within normal limits  COMPREHENSIVE METABOLIC PANEL - Abnormal; Notable for the following:    Sodium 130 (*)    Chloride 97 (*)    Glucose, Bld 105 (*)    BUN <5 (*)    Calcium 8.8 (*)    ALT 15 (*)    All other components within normal limits  TROPONIN I   ____________________________________________  EKG  ED ECG REPORT I, Jene Every, the attending physician, personally viewed and interpreted this ECG.  Date: 05/28/2016  Rate: 92 Rhythm: normal sinus rhythm QRS Axis: normal Intervals: normal ST/T Wave abnormalities: normal Conduction Disturbances: Left anterior fascicular block   ____________________________________________  RADIOLOGY  Chest x-ray unremarkable ____________________________________________   PROCEDURES  Procedure(s) performed: No    Critical Care performed: No ____________________________________________   INITIAL IMPRESSION / ASSESSMENT AND PLAN / ED COURSE  Pertinent labs & imaging results that were available during my care of the patient were reviewed by me and considered in my medical decision making (see chart for details).  Patient treated with DuoNeb, Solu-Medrol given scattered mild wheezes and cough. Chest x-ray and labs are reassuring. He had significant improvement after treatment. We'll discharge the patient with Z-Pak for inflammatory bronchitis    ____________________________________________   FINAL CLINICAL IMPRESSION(S) / ED DIAGNOSES  Final diagnoses:    Bronchitis      NEW MEDICATIONS STARTED DURING THIS VISIT:  Discharge Medication List as of 05/28/2016  9:04 PM    START taking these medications   Details  azithromycin (ZITHROMAX Z-PAK) 250 MG tablet Take 2 tablets (500 mg) on  Day 1,  followed by 1 tablet (250 mg) once daily on Days 2 through 5., Print         Note:  This document was prepared using Dragon voice recognition software and may include unintentional dictation errors.    Jene Every, MD 05/28/16 2302

## 2016-11-10 ENCOUNTER — Emergency Department: Payer: Medicare Other

## 2016-11-10 ENCOUNTER — Encounter: Payer: Self-pay | Admitting: *Deleted

## 2016-11-10 ENCOUNTER — Other Ambulatory Visit: Payer: Self-pay

## 2016-11-10 ENCOUNTER — Emergency Department
Admission: EM | Admit: 2016-11-10 | Discharge: 2016-11-10 | Disposition: A | Discharge status: Home / Self Care | Payer: Medicare Other | Attending: Emergency Medicine | Admitting: Emergency Medicine

## 2016-11-10 DIAGNOSIS — R05 Cough: Secondary | ICD-10-CM | POA: Diagnosis present

## 2016-11-10 DIAGNOSIS — J40 Bronchitis, not specified as acute or chronic: Secondary | ICD-10-CM | POA: Diagnosis not present

## 2016-11-10 DIAGNOSIS — F17210 Nicotine dependence, cigarettes, uncomplicated: Secondary | ICD-10-CM | POA: Diagnosis not present

## 2016-11-10 DIAGNOSIS — Z79899 Other long term (current) drug therapy: Secondary | ICD-10-CM | POA: Insufficient documentation

## 2016-11-10 MED ORDER — AZITHROMYCIN 250 MG PO TABS: ORAL_TABLET | ORAL | 0 refills | Status: DC

## 2016-11-10 MED ORDER — PREDNISONE 10 MG PO TABS: 40.0000 mg | ORAL_TABLET | Freq: Every day | ORAL | 0 refills | Status: AC

## 2016-11-10 MED ORDER — ALBUTEROL SULFATE (2.5 MG/3ML) 0.083% IN NEBU: 3.0000 mL | INHALATION_SOLUTION | Freq: Once | RESPIRATORY_TRACT | Status: DC

## 2016-11-10 MED ORDER — METHYLPREDNISOLONE SODIUM SUCC 125 MG IJ SOLR
125.0000 mg | Freq: Once | INTRAMUSCULAR | Status: AC
  Administered 2016-11-10: 125 mg via INTRAMUSCULAR
  Filled 2016-11-10: qty 2

## 2016-11-10 MED ORDER — AZITHROMYCIN 500 MG PO TABS
500.0000 mg | ORAL_TABLET | Freq: Once | ORAL | Status: AC
  Administered 2016-11-10: 500 mg via ORAL
  Filled 2016-11-10: qty 1

## 2016-11-10 MED ORDER — ALBUTEROL SULFATE (2.5 MG/3ML) 0.083% IN NEBU
5.0000 mg | INHALATION_SOLUTION | Freq: Once | RESPIRATORY_TRACT | Status: AC
  Administered 2016-11-10: 5 mg via RESPIRATORY_TRACT
  Filled 2016-11-10: qty 6

## 2016-11-10 MED ORDER — ALBUTEROL SULFATE HFA 108 (90 BASE) MCG/ACT IN AERS
2.0000 | INHALATION_SPRAY | Freq: Once | RESPIRATORY_TRACT | Status: AC
  Administered 2016-11-10: 2 via RESPIRATORY_TRACT
  Filled 2016-11-10: qty 6.7

## 2016-11-10 NOTE — ED Triage Notes (Signed)
Pt to ED reporting cough and congestions x 1 month that has worsened today. PT reports he has had a small amount of clear sputum with his cough for the past two days. No fevers reported. PT reports he attempted to take tessalon pearls at home without relief.

## 2016-11-10 NOTE — ED Notes (Signed)
Pt states that he has had a dry, hacking cough x1 month.  Pt reports that he feels like he has bronchitis, which he has a hx of.  Pt breathing even and non-labored.  Pt states that he is still smoking, but a little less than his normal 1 pack per day.  Pt ambulatory with triage, with no respiratory distress noted.  Pt is A&Ox4, in NAD.

## 2016-11-10 NOTE — ED Provider Notes (Signed)
[facility] Emergency Department Provider Note  ____________________________________________  Time seen: Approximately 8:11 PM  I have reviewed the triage vital signs and the nursing notes.   HISTORY  Chief Complaint Cough and Nasal Congestion    HPI Marco [name] is a 61 y.o. male that presents to the emergency department for evaluation of dry cough for several weeks.  Patient states that he gets bronchitis once a year.  He is unsure of a fever but he has had chills.  He smokes a pack of cigarettes per day.  No sick contacts.  No allergies or asthma.  No alleviating measures have been attempted.  He denies chest pain, nausea, vomiting, abdominal pain.   Past Medical History:  Diagnosis Date  . Anxiety   . Arthritis   . Depression   . Heartburn   . Seizures (HCC)     There are no active problems to display for this patient.   Past Surgical History:  Procedure Laterality Date  . KNEE ARTHROSCOPY  1975    Prior to Admission medications   Medication Sig Start Date End Date Taking? Authorizing Provider  amoxicillin (AMOXIL) 500 MG tablet Take 1 tablet (500 mg total) by mouth 3 (three) times daily. 07/23/15   Triplett, Rulon Eisenmengerari B, FNP  azithromycin (ZITHROMAX Z-PAK) 250 MG tablet Take 2 tablets (500 mg) on  Day 1,  followed by 1 tablet (250 mg) once daily on Days 2 through 5. 11/10/16   Enid DerryWagner, Tache Bobst, PA-C  FLUoxetine (PROZAC) 20 MG capsule Take by mouth. 05/11/15 05/10/16  [provider]  fluticasone (FLONASE) 50 MCG/ACT nasal spray Place 2 sprays into both nostrils daily. 07/23/15 07/22/16  Triplett, Rulon Eisenmengerari B, FNP  levETIRAcetam (KEPPRA) 500 MG tablet Take by mouth. 01/20/15   [provider]  predniSONE (DELTASONE) 10 MG tablet Take 4 tablets (40 mg total) daily for 5 days by mouth. 11/10/16 11/15/16  Enid DerryWagner, Geral Coker, PA-C  sildenafil (REVATIO) 20 MG tablet  06/18/14   [provider]  sulfamethoxazole-trimethoprim (BACTRIM DS,SEPTRA  DS) 800-160 MG tablet Take 1 tablet by mouth 2 (two) times daily.    [provider]    Allergies Hydrocodone-acetaminophen  Family History  Problem Relation Age of Onset  . Kidney disease Neg Hx   . Prostate cancer Neg Hx     Social History Social History   Tobacco Use  . Smoking status: Current Every Day Smoker    Packs/day: 1.00  . Smokeless tobacco: Never Used  Substance Use Topics  . Alcohol use: Yes    Alcohol/week: 2.4 oz    Types: 4 Cans of beer per week  . Drug use: No     Review of Systems  Eyes: No visual changes. No discharge. ENT: Negative for congestion and rhinorrhea. Cardiovascular: No chest pain. Respiratory: Positive for cough.  Gastrointestinal: No abdominal pain.  No nausea, no vomiting.  No diarrhea.  No constipation. Musculoskeletal: Negative for musculoskeletal pain. Skin: Negative for rash, abrasions, lacerations, ecchymosis. Neurological: Negative for headaches.   ____________________________________________   PHYSICAL EXAM:  VITAL SIGNS: ED Triage Vitals  Enc Vitals Group     BP 11/10/16 1817 (!) 148/83     Pulse Rate 11/10/16 1817 97     Resp 11/10/16 1817 16     Temp 11/10/16 1817 98.2 F (36.8 C)     Temp Source 11/10/16 1817 Oral     SpO2 11/10/16 1817 99 %     Weight 11/10/16 1817 165 lb (74.8 kg)  Height 11/10/16 1817 5' 5.5" (1.664 m)     Head Circumference --      Peak Flow --      Pain Score 11/10/16 1816 7     Pain Loc --      Pain Edu? --      Excl. in GC? --      Constitutional: Alert and oriented. Well appearing and in no acute distress. Eyes: Conjunctivae are normal. PERRL. EOMI. No discharge. Head: Atraumatic. ENT: No frontal and maxillary sinus tenderness.      Ears: Tympanic membranes pearly gray with good landmarks. No discharge.      Nose: No congestion/rhinnorhea.      Mouth/Throat: Mucous membranes are moist. Oropharynx non-erythematous. Tonsils not enlarged. No exudates. Uvula  midline. Neck: No stridor.   Hematological/Lymphatic/Immunilogical: No cervical lymphadenopathy. Cardiovascular: Normal rate, regular rhythm.  Good peripheral circulation. Respiratory: Normal respiratory effort without tachypnea or retractions. Lungs CTAB. Good air entry to the bases with no decreased or absent breath sounds. Gastrointestinal: Bowel sounds 4 quadrants. Soft and nontender to palpation. No guarding or rigidity. No palpable masses. No distention. Musculoskeletal: Full range of motion to all extremities. No gross deformities appreciated. Neurologic:  Normal speech and language. No gross focal neurologic deficits are appreciated.  Skin:  Skin is warm, dry and intact. No rash noted.   ____________________________________________   LABS (all labs ordered are listed, but only abnormal results are displayed)  Labs Reviewed - No data to display ____________________________________________  EKG   ____________________________________________  RADIOLOGY Lexine Baton, personally viewed and evaluated these images (plain radiographs) as part of my medical decision making, as well as reviewing the written report by the radiologist.  Dg Chest 2 View  Result Date: 11/10/2016 CLINICAL DATA:  Dry hacking cough. EXAM: CHEST  2 VIEW COMPARISON:  05/28/2016 FINDINGS: Cardiomediastinal silhouette is normal. Mediastinal contours appear intact. There is no evidence of focal airspace consolidation, pleural effusion or pneumothorax. Mild changes of COPD. Osseous structures are without acute abnormality. Soft tissues are grossly normal. IMPRESSION: Mild changes of COPD. Electronically Signed   By: Ted Mcalpine M.D.   On: 11/10/2016 18:47    ____________________________________________    PROCEDURES  Procedure(s) performed:    Procedures    Medications  albuterol (PROVENTIL) (2.5 MG/3ML) 0.083% nebulizer solution 3 mL (not administered)  albuterol (PROVENTIL) (2.5  MG/3ML) 0.083% nebulizer solution 5 mg (5 mg Nebulization Given 11/10/16 1846)  methylPREDNISolone sodium succinate (SOLU-MEDROL) 125 mg/2 mL injection 125 mg (125 mg Intramuscular Given 11/10/16 2018)  azithromycin (ZITHROMAX) tablet 500 mg (500 mg Oral Given 11/10/16 2016)  albuterol (PROVENTIL HFA;VENTOLIN HFA) 108 (90 Base) MCG/ACT inhaler 2 puff (2 puffs Inhalation Given 11/10/16 2058)     ____________________________________________   INITIAL IMPRESSION / ASSESSMENT AND PLAN / ED COURSE  Pertinent labs & imaging results that were available during my care of the patient were reviewed by me and considered in my medical decision making (see chart for details).  Review of the  [place] was performed in accordance of the [place] prior to dispensing any controlled drugs.     Patient's diagnosis is consistent with bronchitis. Vital signs and exam are reassuring.  Chest x-ray negative for acute abnormalities.  X-ray consistent with COPD.  He was given albuterol nebulizer in ED.  He felt a little nauseous after treatment so inhaler was ordered.  He was given IM Solu-Medrol and oral azithromycin.  Patient does not want a prescription for a high dose of prednisone because it  makes him "mean." Patient feels comfortable going home. Patient will be discharged home with prescriptions for azithromycin and a lower dose of prednisone. Patient is to follow up with his PCP as needed or otherwise directed. Patient is given ED precautions to return to the ED for any worsening or new symptoms.     ____________________________________________  FINAL CLINICAL IMPRESSION(S) / ED DIAGNOSES  Final diagnoses:  Bronchitis      NEW MEDICATIONS STARTED DURING THIS VISIT:      This chart was dictated using voice recognition software/Dragon. Despite best efforts to proofread, errors can occur which can change the meaning. Any change was purely unintentional.    Enid DerryWagner, Megahn Killings, PA-C 11/10/16 2233     Dionne BucySiadecki, Sebastian, MD 11/11/16 [phone]

## 2016-11-10 NOTE — ED Notes (Signed)
Pt taken for xray at this time.

## 2017-03-04 ENCOUNTER — Inpatient Hospital Stay
Admission: EM | Admit: 2017-03-04 | Discharge: 2017-03-04 | DRG: 070 | Discharge status: Against Medical Advice | Payer: Medicare Other | Attending: Internal Medicine | Admitting: Internal Medicine

## 2017-03-04 ENCOUNTER — Other Ambulatory Visit: Payer: Self-pay

## 2017-03-04 ENCOUNTER — Encounter: Payer: Self-pay | Admitting: Emergency Medicine

## 2017-03-04 ENCOUNTER — Emergency Department: Payer: Medicare Other

## 2017-03-04 DIAGNOSIS — R112 Nausea with vomiting, unspecified: Secondary | ICD-10-CM

## 2017-03-04 DIAGNOSIS — Z66 Do not resuscitate: Secondary | ICD-10-CM | POA: Diagnosis present

## 2017-03-04 DIAGNOSIS — F419 Anxiety disorder, unspecified: Secondary | ICD-10-CM | POA: Diagnosis present

## 2017-03-04 DIAGNOSIS — J181 Lobar pneumonia, unspecified organism: Secondary | ICD-10-CM | POA: Diagnosis present

## 2017-03-04 DIAGNOSIS — F329 Major depressive disorder, single episode, unspecified: Secondary | ICD-10-CM | POA: Diagnosis present

## 2017-03-04 DIAGNOSIS — K7030 Alcoholic cirrhosis of liver without ascites: Secondary | ICD-10-CM | POA: Diagnosis present

## 2017-03-04 DIAGNOSIS — R062 Wheezing: Secondary | ICD-10-CM

## 2017-03-04 DIAGNOSIS — R197 Diarrhea, unspecified: Secondary | ICD-10-CM

## 2017-03-04 DIAGNOSIS — R569 Unspecified convulsions: Secondary | ICD-10-CM | POA: Diagnosis present

## 2017-03-04 DIAGNOSIS — Z885 Allergy status to narcotic agent status: Secondary | ICD-10-CM | POA: Diagnosis not present

## 2017-03-04 DIAGNOSIS — E871 Hypo-osmolality and hyponatremia: Secondary | ICD-10-CM

## 2017-03-04 DIAGNOSIS — E876 Hypokalemia: Secondary | ICD-10-CM

## 2017-03-04 DIAGNOSIS — F17200 Nicotine dependence, unspecified, uncomplicated: Secondary | ICD-10-CM | POA: Diagnosis present

## 2017-03-04 DIAGNOSIS — Z79899 Other long term (current) drug therapy: Secondary | ICD-10-CM

## 2017-03-04 DIAGNOSIS — F1010 Alcohol abuse, uncomplicated: Secondary | ICD-10-CM | POA: Diagnosis present

## 2017-03-04 DIAGNOSIS — G9340 Encephalopathy, unspecified: Principal | ICD-10-CM | POA: Diagnosis present

## 2017-03-04 DIAGNOSIS — J441 Chronic obstructive pulmonary disease with (acute) exacerbation: Secondary | ICD-10-CM | POA: Diagnosis present

## 2017-03-04 DIAGNOSIS — E860 Dehydration: Secondary | ICD-10-CM

## 2017-03-04 LAB — CBC
HEMATOCRIT: 49.3 % (ref 40.0–52.0)
HEMOGLOBIN: 16.9 g/dL (ref 13.0–18.0)
MCH: 33.6 pg (ref 26.0–34.0)
MCHC: 34.2 g/dL (ref 32.0–36.0)
MCV: 98.2 fL (ref 80.0–100.0)
Platelets: 192 10*3/uL (ref 150–440)
RBC: 5.03 MIL/uL (ref 4.40–5.90)
RDW: 15.2 % — ABNORMAL HIGH (ref 11.5–14.5)
WBC: 10.7 10*3/uL — ABNORMAL HIGH (ref 3.8–10.6)

## 2017-03-04 LAB — URINE DRUG SCREEN, QUALITATIVE (ARMC ONLY)
Amphetamines, Ur Screen: NOT DETECTED
BARBITURATES, UR SCREEN: NOT DETECTED
Benzodiazepine, Ur Scrn: NOT DETECTED
Cannabinoid 50 Ng, Ur ~~LOC~~: NOT DETECTED
Cocaine Metabolite,Ur ~~LOC~~: NOT DETECTED
MDMA (ECSTASY) UR SCREEN: NOT DETECTED
Methadone Scn, Ur: NOT DETECTED
Opiate, Ur Screen: NOT DETECTED
PHENCYCLIDINE (PCP) UR S: NOT DETECTED
TRICYCLIC, UR SCREEN: NOT DETECTED

## 2017-03-04 LAB — COMPREHENSIVE METABOLIC PANEL
ALK PHOS: 72 U/L (ref 38–126)
ALT: 15 U/L — AB (ref 17–63)
AST: 28 U/L (ref 15–41)
Albumin: 3.5 g/dL (ref 3.5–5.0)
Anion gap: 10 (ref 5–15)
BILIRUBIN TOTAL: 1.6 mg/dL — AB (ref 0.3–1.2)
BUN: 6 mg/dL (ref 6–20)
CALCIUM: 8.6 mg/dL — AB (ref 8.9–10.3)
CO2: 25 mmol/L (ref 22–32)
CREATININE: 0.74 mg/dL (ref 0.61–1.24)
Chloride: 91 mmol/L — ABNORMAL LOW (ref 101–111)
GFR calc Af Amer: >60 mL/min (ref >60)
GFR calc non Af Amer: >60 mL/min (ref >60)
Glucose, Bld: 105 mg/dL — ABNORMAL HIGH (ref 65–99)
Potassium: 3.1 mmol/L — ABNORMAL LOW (ref 3.5–5.1)
Sodium: 126 mmol/L — ABNORMAL LOW (ref 135–145)
TOTAL PROTEIN: 6.9 g/dL (ref 6.5–8.1)

## 2017-03-04 LAB — BLOOD GAS, VENOUS
Acid-Base Excess: 3.9 mmol/L — ABNORMAL HIGH (ref 0.0–2.0)
Bicarbonate: 29.2 mmol/L — ABNORMAL HIGH (ref 20.0–28.0)
O2 Saturation: 74.8 %
PCO2 VEN: 45 mmHg (ref 44.0–60.0)
PH VEN: 7.42 (ref 7.250–7.430)
PO2 VEN: 39 mmHg (ref 32.0–45.0)
Patient temperature: 37

## 2017-03-04 LAB — PROTIME-INR
INR: 1.01
Prothrombin Time: 13.2 seconds (ref 11.4–15.2)

## 2017-03-04 LAB — URINALYSIS, COMPLETE (UACMP) WITH MICROSCOPIC
BILIRUBIN URINE: NEGATIVE
Bacteria, UA: NONE SEEN
Glucose, UA: NEGATIVE mg/dL
Hgb urine dipstick: NEGATIVE
KETONES UR: NEGATIVE mg/dL
LEUKOCYTES UA: NEGATIVE
NITRITE: NEGATIVE
Protein, ur: NEGATIVE mg/dL
SPECIFIC GRAVITY, URINE: 1.027 (ref 1.005–1.030)
SQUAMOUS EPITHELIAL / LPF: NONE SEEN
pH: 7 (ref 5.0–8.0)

## 2017-03-04 LAB — INFLUENZA PANEL BY PCR (TYPE A & B)
Influenza A By PCR: NEGATIVE
Influenza B By PCR: NEGATIVE

## 2017-03-04 LAB — ETHANOL

## 2017-03-04 LAB — TROPONIN I: Troponin I: <0.03 ng/mL (ref <0.03)

## 2017-03-04 LAB — AMMONIA: AMMONIA: 29 umol/L (ref 9–35)

## 2017-03-04 MED ORDER — NICOTINE 14 MG/24HR TD PT24: 14.0000 mg | MEDICATED_PATCH | Freq: Every day | TRANSDERMAL | Status: DC

## 2017-03-04 MED ORDER — IPRATROPIUM-ALBUTEROL 0.5-2.5 (3) MG/3ML IN SOLN: 3.0000 mL | Freq: Four times a day (QID) | RESPIRATORY_TRACT | Status: DC

## 2017-03-04 MED ORDER — POTASSIUM CHLORIDE CRYS ER 20 MEQ PO TBCR
40.0000 meq | EXTENDED_RELEASE_TABLET | Freq: Once | ORAL | Status: AC
  Administered 2017-03-04: 40 meq via ORAL
  Filled 2017-03-04: qty 2

## 2017-03-04 MED ORDER — ENOXAPARIN SODIUM 40 MG/0.4ML ~~LOC~~ SOLN: 40.0000 mg | SUBCUTANEOUS | Status: DC

## 2017-03-04 MED ORDER — LORAZEPAM 2 MG/ML IJ SOLN: 1.0000 mg | Freq: Four times a day (QID) | INTRAMUSCULAR | Status: DC | PRN

## 2017-03-04 MED ORDER — ACETAMINOPHEN 650 MG RE SUPP: 650.0000 mg | Freq: Four times a day (QID) | RECTAL | Status: DC | PRN

## 2017-03-04 MED ORDER — TRAZODONE HCL 50 MG PO TABS: 50.0000 mg | ORAL_TABLET | Freq: Every day | ORAL | Status: DC

## 2017-03-04 MED ORDER — LOPERAMIDE HCL 2 MG PO TABS: 2.0000 mg | ORAL_TABLET | Freq: Four times a day (QID) | ORAL | 0 refills | Status: DC | PRN

## 2017-03-04 MED ORDER — FOLIC ACID 1 MG PO TABS: 1.0000 mg | ORAL_TABLET | Freq: Every day | ORAL | Status: DC

## 2017-03-04 MED ORDER — SODIUM CHLORIDE 0.9 % IV BOLUS (SEPSIS)
1000.0000 mL | Freq: Once | INTRAVENOUS | Status: AC
  Administered 2017-03-04: 1000 mL via INTRAVENOUS

## 2017-03-04 MED ORDER — IPRATROPIUM-ALBUTEROL 0.5-2.5 (3) MG/3ML IN SOLN
RESPIRATORY_TRACT | Status: DC
Start: 2017-03-04 — End: 2017-03-04
  Filled 2017-03-04: qty 3

## 2017-03-04 MED ORDER — MAGNESIUM SULFATE 2 GM/50ML IV SOLN
2.0000 g | Freq: Once | INTRAVENOUS | Status: AC
  Administered 2017-03-04: 2 g via INTRAVENOUS

## 2017-03-04 MED ORDER — PANTOPRAZOLE SODIUM 40 MG IV SOLR: 40.0000 mg | Freq: Two times a day (BID) | INTRAVENOUS | Status: DC

## 2017-03-04 MED ORDER — ACETAMINOPHEN 325 MG PO TABS: 650.0000 mg | ORAL_TABLET | Freq: Four times a day (QID) | ORAL | Status: DC | PRN

## 2017-03-04 MED ORDER — ONDANSETRON HCL 4 MG PO TABS: 4.0000 mg | ORAL_TABLET | Freq: Four times a day (QID) | ORAL | Status: DC | PRN

## 2017-03-04 MED ORDER — BUDESONIDE 0.5 MG/2ML IN SUSP: 0.5000 mg | Freq: Two times a day (BID) | RESPIRATORY_TRACT | Status: DC

## 2017-03-04 MED ORDER — LORAZEPAM 1 MG PO TABS: 1.0000 mg | ORAL_TABLET | Freq: Four times a day (QID) | ORAL | Status: DC | PRN

## 2017-03-04 MED ORDER — ONDANSETRON HCL 4 MG/2ML IJ SOLN: 4.0000 mg | Freq: Four times a day (QID) | INTRAMUSCULAR | Status: DC | PRN

## 2017-03-04 MED ORDER — LEVETIRACETAM 750 MG PO TABS: 750.0000 mg | ORAL_TABLET | Freq: Two times a day (BID) | ORAL | Status: DC

## 2017-03-04 MED ORDER — DOXYCYCLINE HYCLATE 100 MG PO TABS: 100.0000 mg | ORAL_TABLET | Freq: Two times a day (BID) | ORAL | Status: DC

## 2017-03-04 MED ORDER — VITAMIN B-1 100 MG PO TABS: 100.0000 mg | ORAL_TABLET | Freq: Every day | ORAL | Status: DC

## 2017-03-04 MED ORDER — ONDANSETRON HCL 4 MG/2ML IJ SOLN
4.0000 mg | Freq: Once | INTRAMUSCULAR | Status: AC
Start: 2017-03-04 — End: 2017-03-04
  Administered 2017-03-04: 4 mg via INTRAVENOUS
  Filled 2017-03-04: qty 2

## 2017-03-04 MED ORDER — ADULT MULTIVITAMIN W/MINERALS CH: 1.0000 | ORAL_TABLET | Freq: Every day | ORAL | Status: DC

## 2017-03-04 MED ORDER — IPRATROPIUM-ALBUTEROL 0.5-2.5 (3) MG/3ML IN SOLN
3.0000 mL | Freq: Once | RESPIRATORY_TRACT | Status: AC
  Administered 2017-03-04: 3 mL via RESPIRATORY_TRACT
  Filled 2017-03-04: qty 3

## 2017-03-04 MED ORDER — MAGNESIUM SULFATE 2 GM/50ML IV SOLN
INTRAVENOUS | Status: AC
  Filled 2017-03-04: qty 50

## 2017-03-04 MED ORDER — POTASSIUM CHLORIDE IN NACL 20-0.9 MEQ/L-% IV SOLN: INTRAVENOUS | Status: DC

## 2017-03-04 MED ORDER — IOPAMIDOL (ISOVUE-300) INJECTION 61%
100.0000 mL | Freq: Once | INTRAVENOUS | Status: AC | PRN
  Administered 2017-03-04: 100 mL via INTRAVENOUS

## 2017-03-04 MED ORDER — ONDANSETRON 4 MG PO TBDP: 4.0000 mg | ORAL_TABLET | Freq: Three times a day (TID) | ORAL | 0 refills | Status: DC | PRN

## 2017-03-04 MED ORDER — SODIUM CHLORIDE 0.9 % IV SOLN: 1.0000 g | INTRAVENOUS | Status: DC

## 2017-03-04 MED ORDER — THIAMINE HCL 100 MG/ML IJ SOLN: 100.0000 mg | Freq: Every day | INTRAMUSCULAR | Status: DC

## 2017-03-04 MED ORDER — METHYLPREDNISOLONE SODIUM SUCC 40 MG IJ SOLR: 40.0000 mg | Freq: Every day | INTRAMUSCULAR | Status: DC

## 2017-03-04 NOTE — H&P (Signed)
[facility] [place] [facility]   PATIENT NAME: Marco [name]    MR#:  [id number]  DATE OF BIRTH:  1955-04-03  DATE OF ADMISSION:  03/04/2017  PRIMARY CARE PHYSICIAN: Jerl Mina, MD   REQUESTING/REFERRING PHYSICIAN: Dr Virgilio Frees  CHIEF COMPLAINT:   Chief Complaint  Patient presents with  . Emesis  . Weakness  . Cough    HISTORY OF PRESENT ILLNESS:  Marco [name]  is a 62 y.o. male with a known history of alcohol abuse and tobacco abuse.  He presents to the hospital with altered mental status.  He has had an 18 pound weight loss in 90 days.  He has been feeling sick and tired.  He has been feeling worn out and fatigued.  Has had some vomiting and some diarrhea.  In the ER is found to have a low sodium and potassium.  His ammonia level is pending.  He had CT scans which showed cirrhosis of the liver.  PAST MEDICAL HISTORY:   Past Medical History:  Diagnosis Date  . Anxiety   . Arthritis   . Depression   . Heartburn   . Seizures (HCC)     PAST SURGICAL HISTORY:   Past Surgical History:  Procedure Laterality Date  . KNEE ARTHROSCOPY  1975    SOCIAL HISTORY:   Social History   Tobacco Use  . Smoking status: Current Every Day Smoker    Packs/day: 1.00  . Smokeless tobacco: Never Used  Substance Use Topics  . Alcohol use: Yes    Alcohol/week: 2.4 oz    Types: 4 Cans of beer per week    FAMILY HISTORY:   Family History  Problem Relation Age of Onset  . Kidney disease Neg Hx   . Prostate cancer Neg Hx     DRUG ALLERGIES:   Allergies  Allergen Reactions  . Hydrocodone-Acetaminophen Nausea Only and Nausea And Vomiting    REVIEW OF SYSTEMS:  CONSTITUTIONAL: No fever, positive for chills.  Positive for weight loss.  Positive for fatigue and weakness.  EYES: No blurred or double vision.  EARS, NOSE, AND THROAT: No tinnitus or ear pain. No sore throat.  Positive dysphagia to solids. RESPIRATORY: Some cough, shortness  of breath, and wheezing.  No hemoptysis.  CARDIOVASCULAR: No chest pain, orthopnea, edema.  GASTROINTESTINAL: Occasional nausea and vomiting.  Some diarrhea.  Some abdominal pain.  Occasional blood in bowel movements GENITOURINARY: No dysuria, hematuria.  ENDOCRINE: No polyuria, nocturia,  HEMATOLOGY: No anemia, easy bruising or bleeding SKIN: No rash or lesion. MUSCULOSKELETAL: No joint pain or arthritis.   NEUROLOGIC: No tingling, numbness, weakness.  PSYCHIATRY: No anxiety or depression.   MEDICATIONS AT HOME:   Prior to Admission medications   Medication Sig Start Date End Date Taking? Authorizing Provider  levETIRAcetam (KEPPRA) 750 MG tablet Take 750 mg by mouth 2 (two) times daily.  01/20/15  Yes [provider]  traZODone (DESYREL) 50 MG tablet Take 50 mg by mouth at bedtime.   Yes [provider]  FLUoxetine (PROZAC) 20 MG capsule Take 20 mg by mouth 2 (two) times daily.  05/11/15 05/10/16  [provider]  fluticasone (FLONASE) 50 MCG/ACT nasal spray Place 2 sprays into both nostrils daily. Patient not taking: Reported on 03/04/2017 07/23/15 07/22/16  Kem Boroughs B, FNP      VITAL SIGNS:  Blood pressure 129/80, pulse (!) 104, temperature 98.8 F (37.1 C), temperature source Oral, resp. rate 20, height 5\' 5"  (1.651 m), weight  64.4 kg (142 lb), SpO2 98 %.  PHYSICAL EXAMINATION:  GENERAL:  62 y.o.-year-old patient lying in the bed with no acute distress.  EYES: Pupils equal, round, reactive to light and accommodation. No scleral icterus. Extraocular muscles intact.  HEENT: Head atraumatic, normocephalic. Oropharynx and nasopharynx clear.  NECK:  Supple, no jugular venous distention. No thyroid enlargement, no tenderness.  LUNGS: Decreased breath sounds bilaterally, positive wheezing throughout entire lung field.  No rales,rhonchi or crepitation. No use of accessory muscles of respiration.  CARDIOVASCULAR: S1, S2 normal. No murmurs, rubs, or gallops.   ABDOMEN: Soft, nontender, distended. Bowel sounds present. No organomegaly or mass.  EXTREMITIES: No pedal edema, cyanosis, or clubbing.  NEUROLOGIC: Cranial nerves II through XII are intact. Muscle strength 5/5 in all extremities. Sensation intact. Gait not checked.  PSYCHIATRIC: The patient is alert and oriented x 3.  SKIN: No rash, lesion, or ulcer.   LABORATORY PANEL:   CBC Recent Labs  Lab 03/04/17 1500  WBC 10.7*  HGB 16.9  HCT 49.3  PLT 192   ------------------------------------------------------------------------------------------------------------------  Chemistries  Recent Labs  Lab 03/04/17 1500  NA 126*  K 3.1*  CL 91*  CO2 25  GLUCOSE 105*  BUN 6  CREATININE 0.74  CALCIUM 8.6*  AST 28  ALT 15*  ALKPHOS 72  BILITOT 1.6*   ------------------------------------------------------------------------------------------------------------------  Cardiac Enzymes Recent Labs  Lab 03/04/17 1500  TROPONINI <0.03   ------------------------------------------------------------------------------------------------------------------  RADIOLOGY:  Dg Chest 2 View  Result Date: 03/04/2017 CLINICAL DATA:  Cough EXAM: CHEST  2 VIEW COMPARISON:  11/10/2016 FINDINGS: Normal heart size and mediastinal contours. Borderline hyperinflation. No acute infiltrate or edema. No effusion or pneumothorax. No acute osseous findings. IMPRESSION: Negative chest. Electronically Signed   By: Marnee SpringJonathon  Watts M.D.   On: 03/04/2017 15:20   Ct Head Wo Contrast  Result Date: 03/04/2017 CLINICAL DATA:  62 year old male with daily alcohol intake. Seizures, on seizure medication. Somnolence and confusion. Initial encounter. EXAM: CT HEAD WITHOUT CONTRAST TECHNIQUE: Contiguous axial images were obtained from the base of the skull through the vertex without intravenous contrast. COMPARISON:  11/11/2013 head CT. FINDINGS: Brain: No intracranial hemorrhage or CT evidence of large acute infarct. Moderate  global atrophy. No intracranial mass lesion noted on this unenhanced exam. Vascular: Minimal vascular calcifications Skull: No acute abnormality. Sinuses/Orbits: No acute orbital abnormality. Mild polypoid opacification medial aspect right maxillary sinus. Remainder of visualized paranasal sinuses are clear. Other: Partial opacification left mastoid air cells without obstructing lesion of the eustachian tube noted. IMPRESSION: No acute intracranial abnormality noted. Moderate global atrophy. Polypoid opacification medial aspect right maxillary sinus and partial opacification left mastoid air cells. Electronically Signed   By: Lacy DuverneySteven  Olson M.D.   On: 03/04/2017 17:57   Ct Chest W Contrast  Result Date: 03/04/2017 CLINICAL DATA:  Unintended weight loss. Nonlocalized abdominal pain. Recent nausea and vomiting. History of daily ETOH use and seizures. EXAM: CT CHEST, ABDOMEN, AND PELVIS WITH CONTRAST TECHNIQUE: Multidetector CT imaging of the chest, abdomen and pelvis was performed following the standard protocol during bolus administration of intravenous contrast. CONTRAST:  100mL ISOVUE-300 IOPAMIDOL (ISOVUE-300) INJECTION 61% COMPARISON:  CT AP 08/06/2009 FINDINGS: CT CHEST FINDINGS Cardiovascular: Normal branch pattern of the great vessels. No aortic aneurysm or dissection. No large central pulmonary embolus. Normal sized heart without pericardial effusion. Minimal coronary arteriosclerosis along the circumflex. Mediastinum/Nodes: No enlarged mediastinal, hilar, or axillary lymph nodes. Thyroid gland, trachea, and esophagus demonstrate no significant findings. Lungs/Pleura: Minimal scarring and/or atelectasis at the  apices. Faint nonspecific ground-glass opacities in both upper lobes and lingula with subpleural areas of atelectasis and/or scarring in the right middle lobe. No effusion or pneumothorax. Musculoskeletal: Bilateral gynecomastia. CT ABDOMEN PELVIS FINDINGS Hepatobiliary: Subtle surface nodularity of  the liver that may reflect morphologic changes of cirrhosis. A pair of adjacent subcentimeter hypodensities in the right hepatic lobe are noted compatible with cysts or hemangiomata. A third similar subcentimeter hypodensity is seen further caudad anteriorly within the right hepatic lobe measuring up to 8 mm. No biliary dilatation. Normal gallbladder. Pancreas: No ductal dilatation, inflammation or mass. Spleen: Normal Adrenals/Urinary Tract: Normal bilateral adrenal glands. Symmetric enhancement of both kidneys without obstructive uropathy. Normal bladder. Stomach/Bowel: Small hiatal hernia. Physiologic distention of the stomach. Mild small bowel distention with fluid with slight fold thickening in the central abdomen compatible with an enteritis. No mechanical source of obstruction is seen. Decompressed descending colon through rectum. Liquid stool noted within the cecum and ascending colon. No evidence of appendicitis. Vascular/Lymphatic: Aortoiliac and branch vessel atherosclerosis without aneurysm or dissection. Reproductive: Prostate is unremarkable. Other: No abdominal wall hernia or abnormality. No abdominopelvic ascites. Musculoskeletal: Degenerative disc disease L5-S1. No acute nor suspicious osseous abnormality. IMPRESSION: 1. Nonspecific faint ground-glass opacities are seen in both upper lobes left greater than right as well as lingula. Findings may represent small foci of alveolitis or pneumonitis are nonspecific. 2. Minimal coronary arteriosclerosis. 3. Mild fluid distention of small bowel without mechanical obstruction suspicious for mild small bowel enteritis. 4. Morphologic appearance of cirrhosis. 5. Three subcentimeter hypodensities in the right hepatic lobe are noted too small to further characterize but statistically consistent with cysts or hemangiomata. 6. Degenerative disc disease L5-S1. Electronically Signed   By: Tollie Eth M.D.   On: 03/04/2017 18:02   Ct Abdomen Pelvis W  Contrast  Result Date: 03/04/2017 CLINICAL DATA:  Unintended weight loss. Nonlocalized abdominal pain. Recent nausea and vomiting. History of daily ETOH use and seizures. EXAM: CT CHEST, ABDOMEN, AND PELVIS WITH CONTRAST TECHNIQUE: Multidetector CT imaging of the chest, abdomen and pelvis was performed following the standard protocol during bolus administration of intravenous contrast. CONTRAST:  ISOVUE-300 IOPAMIDOL (ISOVUE-300) INJECTION 61% COMPARISON:  CT AP 08/06/2009 FINDINGS: CT CHEST FINDINGS Cardiovascular: Normal branch pattern of the great vessels. No aortic aneurysm or dissection. No large central pulmonary embolus. Normal sized heart without pericardial effusion. Minimal coronary arteriosclerosis along the circumflex. Mediastinum/Nodes: No enlarged mediastinal, hilar, or axillary lymph nodes. Thyroid gland, trachea, and esophagus demonstrate no significant findings. Lungs/Pleura: Minimal scarring and/or atelectasis at the apices. Faint nonspecific ground-glass opacities in both upper lobes and lingula with subpleural areas of atelectasis and/or scarring in the right middle lobe. No effusion or pneumothorax. Musculoskeletal: Bilateral gynecomastia. CT ABDOMEN PELVIS FINDINGS Hepatobiliary: Subtle surface nodularity of the liver that may reflect morphologic changes of cirrhosis. A pair of adjacent subcentimeter hypodensities in the right hepatic lobe are noted compatible with cysts or hemangiomata. A third similar subcentimeter hypodensity is seen further caudad anteriorly within the right hepatic lobe measuring up to 8 mm. No biliary dilatation. Normal gallbladder. Pancreas: No ductal dilatation, inflammation or mass. Spleen: Normal Adrenals/Urinary Tract: Normal bilateral adrenal glands. Symmetric enhancement of both kidneys without obstructive uropathy. Normal bladder. Stomach/Bowel: Small hiatal hernia. Physiologic distention of the stomach. Mild small bowel distention with fluid with slight  fold thickening in the central abdomen compatible with an enteritis. No mechanical source of obstruction is seen. Decompressed descending colon through rectum. Liquid stool noted within the cecum and ascending  colon. No evidence of appendicitis. Vascular/Lymphatic: Aortoiliac and branch vessel atherosclerosis without aneurysm or dissection. Reproductive: Prostate is unremarkable. Other: No abdominal wall hernia or abnormality. No abdominopelvic ascites. Musculoskeletal: Degenerative disc disease L5-S1. No acute nor suspicious osseous abnormality. IMPRESSION: 1. Nonspecific faint ground-glass opacities are seen in both upper lobes left greater than right as well as lingula. Findings may represent small foci of alveolitis or pneumonitis are nonspecific. 2. Minimal coronary arteriosclerosis. 3. Mild fluid distention of small bowel without mechanical obstruction suspicious for mild small bowel enteritis. 4. Morphologic appearance of cirrhosis. 5. Three subcentimeter hypodensities in the right hepatic lobe are noted too small to further characterize but statistically consistent with cysts or hemangiomata. 6. Degenerative disc disease L5-S1. Electronically Signed   By: Tollie Eth M.D.   On: 03/04/2017 18:02    EKG:   Normal sinus rhythm 77 bpm, APCs.  IMPRESSION AND PLAN:   1.  Acute encephalopathy.  Still waiting for ammonia level to come back.  As per ER physician and this is better at this point. 2.  COPD exacerbation start Solu-Medrol DuoNeb nebulizer solution and budesonide. 3.  Possibility of pneumonia on CT scan of the chest.  Start Rocephin and doxycycline 4.  Cirrhosis with alcohol abuse.  Cessation from alcohol discussed since he has cirrhosis of the liver.  Send off hepatitis profiles.  CIWA protocol.  No signs of withdrawal at this point. 5.  Tobacco abuse.  Smoking cessation counseling done 4 minutes by me.  Nicotine patch ordered. 6.  Hypokalemia replace potassium and IV fluids and also  magnesium 7.  Hyponatremia likely from beer.  I have reviewed the laboratory data, EKG and all radiological studies Management plans discussed with the patient, and he is in agreement.  CODE STATUS: DNR  TOTAL TIME TAKING CARE OF THIS PATIENT: 50 minutes.    Alford Highland M.D on 03/04/2017 at 7:24 PM  Between 7am to 6pm - Pager - [phone]  After 6pm call admission pager [phone]  [facility]  [phone]  CC: Primary care physician; Jerl Mina, MD

## 2017-03-04 NOTE — ED Notes (Signed)
Dr Sharma CovertNorman spoke at great length with patient regarding admission and need for inpatient treatment, pt continues to insist on leaving AMA because of Dr Mathews RobinsonsWieting's approach to patient. Apologies were again provided to patient and reassurance that that doctor does not follow him throughout his admission. Patient persists leaving AMA.

## 2017-03-04 NOTE — Discharge Instructions (Signed)
Today you are leaving AGAINST MEDICAL ADVICE.  It is possible that your medical conditions will worsen, or that you can die if you leave without treatment today.  We have discussed this and you have demonstrated that you understand and are willing to take the risk of worsening.  Please drink plenty of fluid to stay well-hydrated.  Please take a clear liquid diet for the next 24-48 hours, then advance to a bland diet as tolerated.  You may use Zofran for your nausea and vomiting and loperamide for your diarrhea.  Please follow-up with your GI doctor and your primary care physician.  Return to the emergency department if you develop severe pain, lightheadedness or fainting, fever, shortness of breath, or any other symptoms concerning to you.

## 2017-03-04 NOTE — ED Notes (Signed)
Pt requesting to sign out AMA, risks explained to patient and patient states "I don't care, that doctor has the personality of a sledge hammer" this RN explained that doctor will not be with him throughout his admission but patient is persistent that he wants to leave. This RN convinced patient to stay for a bit to let fluids run and patient agrees. Dr Renae GlossWieting aware.

## 2017-03-04 NOTE — ED Notes (Addendum)
Pt presents with n/v yesterday and losing weight/no appetite x several months. States he has lost 18 # in 3 months. [facility]. They said he "looked sick" and sent him to ed. Pt reports that he was "sleepy" and that they were concerned. Pt states he drinks daily, about 6-8 drinks, smokes a pack of cigarettes per day. Pt alert & oriented x 4 with nad noted. Denies pain.

## 2017-03-04 NOTE — ED Notes (Signed)
Pt signed out AMA, paperwork and prescriptions reviewed, including followup. Pt verbalizes understanding. Ambulatory to lobby with cane with steady gait with belongings accounted for.

## 2017-03-04 NOTE — ED Triage Notes (Addendum)
States general weakness, decreased appetite, nausea and vomiting x 3 weeks. Frequent cough noted in triage.

## 2017-03-04 NOTE — ED Provider Notes (Addendum)
[facility] Emergency Department Provider Note  ____________________________________________  Time seen: Approximately 5:24 PM  I have reviewed the triage vital signs and the nursing notes.   HISTORY  Chief Complaint Emesis; Weakness; and Cough    HPI Marco [name] is a 62 y.o. male with a history of daily EtOH intake, seizures on Keppra, presenting from GI clinic for somnolence and confusion.  The patient reports that for the past 3 months, he has had an unintentional 18 pound weight loss associated with approximately 3 daily episodes of loose stool that occasionally have a small amount of blood streak.  He has also recently started to have nausea and vomiting.  Today, the patient went for GI evaluation to set up endoscopy and colonoscopy, and he was "too sick" to have his appointment.  He states that he has been having difficulty sleeping and was too sleepy.  He denies any fevers or chills.  His last EtOH intake was yesterday.  At this time, the patient denies any pain, he is A&Ox3.  CBG is pending.  Past Medical History:  Diagnosis Date  . Anxiety   . Arthritis   . Depression   . Heartburn   . Seizures [facility])     Patient Active Problem List   Diagnosis Date Noted  . Acute encephalopathy 03/04/2017    Past Surgical History:  Procedure Laterality Date  . KNEE ARTHROSCOPY  1975    Current Outpatient Rx  . Order #: [id number] Class: Historical Med  . Order #: [id number] Class: Historical Med  . Order #: [id number] Class: Historical Med  . Order #: [id number] Class: Print  . Order #: [id number] Class: Print  . Order #: [id number] Class: Print    Allergies Hydrocodone-acetaminophen  Family History  Problem Relation Age of Onset  . Kidney disease Neg Hx   . Prostate cancer Neg Hx     Social History Social History   Tobacco Use  . Smoking status: Current Every Day Smoker    Packs/day: 1.00  . Smokeless tobacco: Never Used  Substance Use Topics  .  Alcohol use: Yes    Alcohol/week: 2.4 oz    Types: 4 Cans of beer per week  . Drug use: No    Review of Systems Constitutional: No fever/chills.  No lightheadedness or syncope.  Positive difficulty sleeping.  Positive somnolence and confusion. Eyes: No visual changes. ENT: No sore throat. No congestion or rhinorrhea. Cardiovascular: Denies chest pain. Denies palpitations. Respiratory: Denies shortness of breath.  No cough. Gastrointestinal: No abdominal pain.  +nausea, +vomiting.  +diarrhea.  No constipation.  Positive anorexia.  Positive unintentional weight loss. Genitourinary: Negative for dysuria. Musculoskeletal: Negative for back pain. Skin: Negative for rash. Neurological: Negative for headaches. No focal numbness, tingling or weakness.  Positive altered mental status.  Positive confusion. Psychiatric:Positive EtOH.  ____________________________________________   PHYSICAL EXAM:  VITAL SIGNS: ED Triage Vitals  Enc Vitals Group     BP 03/04/17 1500 129/80     Pulse Rate 03/04/17 1500 (!) 104     Resp 03/04/17 1500 20     Temp 03/04/17 1500 98.8 F (37.1 C)     Temp Source 03/04/17 1500 Oral     SpO2 03/04/17 1500 98 %     Weight 03/04/17 1502 142 lb (64.4 kg)     Height 03/04/17 1502 5\' 5"  (1.651 m)     Head Circumference --      Peak Flow --      Pain Score --  Pain Loc --      Pain Edu? --      Excl. in GC? --     Constitutional: Alert and oriented.  Chronically ill appearing but in no acute distress. Answers questions appropriately.  GCS is 15. Eyes: Conjunctivae are normal.  EOMI. positive scleral icterus. Head: Atraumatic. Nose: No congestion/rhinnorhea. Mouth/Throat: Mucous membranes are dry.  Neck: No stridor.  Supple.  No JVD.  No meningismus. Cardiovascular: Fast rate, regular rhythm. No murmurs, rubs or gallops.  Respiratory: Normal respiratory effort.  No accessory muscle use or retractions.  Inspiratory and expiratory wheezing without any rales  or rhonchi.   Gastrointestinal: Soft, nontender and mildly distended.  No guarding or rebound.  No peritoneal signs. Musculoskeletal: No LE edema. Neurologic:  A&Ox3.  Speech is clear.  Face and smile are symmetric.  EOMI.  Moves all extremities well. Skin:  Skin is warm, dry and intact. No rash noted. Psychiatric: Mood and affect are normal. Speech and behavior are normal.  Normal judgement.  ____________________________________________   LABS (all labs ordered are listed, but only abnormal results are displayed)  Labs Reviewed  CBC - Abnormal; Notable for the following components:      Result Value   WBC 10.7 (*)    RDW 15.2 (*)    All other components within normal limits  COMPREHENSIVE METABOLIC PANEL - Abnormal; Notable for the following components:   Sodium 126 (*)    Potassium 3.1 (*)    Chloride 91 (*)    Glucose, Bld 105 (*)    Calcium 8.6 (*)    ALT 15 (*)    Total Bilirubin 1.6 (*)    All other components within normal limits  URINALYSIS, COMPLETE (UACMP) WITH MICROSCOPIC - Abnormal; Notable for the following components:   Color, Urine YELLOW (*)    APPearance CLEAR (*)    All other components within normal limits  BLOOD GAS, VENOUS - Abnormal; Notable for the following components:   Bicarbonate 29.2 (*)    Acid-Base Excess 3.9 (*)    All other components within normal limits  TROPONIN I  ETHANOL  URINE DRUG SCREEN, QUALITATIVE ([facility] ONLY)  AMMONIA  PROTIME-INR  INFLUENZA PANEL BY PCR (TYPE A & B)  HEPATITIS B CORE ANTIBODY, TOTAL  HEPATITIS B SURFACE ANTIGEN  HEPATITIS B SURFACE ANTIBODY, QUANTITATIVE  HEPATITIS C ANTIBODY  HIV ANTIBODY (ROUTINE TESTING)  BASIC METABOLIC PANEL  CBC  OCCULT BLOOD X 1 CARD TO LAB, STOOL   ____________________________________________  EKG  ED ECG REPORT I, Rockne Menghini, the attending physician, personally viewed and interpreted this ECG.   Date: 03/04/2017  EKG Time: 1507  Rate: 97  Rhythm: normal sinus  rhythm  Axis: leftward  Intervals:none  ST&T Change: No STEMI  ED ECG REPORT I, Rockne Menghini, the attending physician, personally viewed and interpreted this ECG.   Date: 03/04/2017  EKG Time: 1730  Rate: 77  Rhythm: normal sinus rhythm  Axis: leftward  Intervals:none  ST&T Change: No STEMI   ____________________________________________  RADIOLOGY  Dg Chest 2 View  Result Date: 03/04/2017 CLINICAL DATA:  Cough EXAM: CHEST  2 VIEW COMPARISON:  11/10/2016 FINDINGS: Normal heart size and mediastinal contours. Borderline hyperinflation. No acute infiltrate or edema. No effusion or pneumothorax. No acute osseous findings. IMPRESSION: Negative chest. Electronically Signed   By: Marnee Spring M.D.   On: 03/04/2017 15:20   Ct Head Wo Contrast  Result Date: 03/04/2017 CLINICAL DATA:  62 year old male with daily alcohol intake. Seizures, on  seizure medication. Somnolence and confusion. Initial encounter. EXAM: CT HEAD WITHOUT CONTRAST TECHNIQUE: Contiguous axial images were obtained from the base of the skull through the vertex without intravenous contrast. COMPARISON:  11/11/2013 head CT. FINDINGS: Brain: No intracranial hemorrhage or CT evidence of large acute infarct. Moderate global atrophy. No intracranial mass lesion noted on this unenhanced exam. Vascular: Minimal vascular calcifications Skull: No acute abnormality. Sinuses/Orbits: No acute orbital abnormality. Mild polypoid opacification medial aspect right maxillary sinus. Remainder of visualized paranasal sinuses are clear. Other: Partial opacification left mastoid air cells without obstructing lesion of the eustachian tube noted. IMPRESSION: No acute intracranial abnormality noted. Moderate global atrophy. Polypoid opacification medial aspect right maxillary sinus and partial opacification left mastoid air cells. Electronically Signed   By: Lacy Duverney M.D.   On: 03/04/2017 17:57   Ct Chest W Contrast  Result Date:  03/04/2017 CLINICAL DATA:  Unintended weight loss. Nonlocalized abdominal pain. Recent nausea and vomiting. History of daily ETOH use and seizures. EXAM: CT CHEST, ABDOMEN, AND PELVIS WITH CONTRAST TECHNIQUE: Multidetector CT imaging of the chest, abdomen and pelvis was performed following the standard protocol during bolus administration of intravenous contrast. CONTRAST:  ISOVUE-300 IOPAMIDOL (ISOVUE-300) INJECTION 61% COMPARISON:  CT AP 08/06/2009 FINDINGS: CT CHEST FINDINGS Cardiovascular: Normal branch pattern of the great vessels. No aortic aneurysm or dissection. No large central pulmonary embolus. Normal sized heart without pericardial effusion. Minimal coronary arteriosclerosis along the circumflex. Mediastinum/Nodes: No enlarged mediastinal, hilar, or axillary lymph nodes. Thyroid gland, trachea, and esophagus demonstrate no significant findings. Lungs/Pleura: Minimal scarring and/or atelectasis at the apices. Faint nonspecific ground-glass opacities in both upper lobes and lingula with subpleural areas of atelectasis and/or scarring in the right middle lobe. No effusion or pneumothorax. Musculoskeletal: Bilateral gynecomastia. CT ABDOMEN PELVIS FINDINGS Hepatobiliary: Subtle surface nodularity of the liver that may reflect morphologic changes of cirrhosis. A pair of adjacent subcentimeter hypodensities in the right hepatic lobe are noted compatible with cysts or hemangiomata. A third similar subcentimeter hypodensity is seen further caudad anteriorly within the right hepatic lobe measuring up to 8 mm. No biliary dilatation. Normal gallbladder. Pancreas: No ductal dilatation, inflammation or mass. Spleen: Normal Adrenals/Urinary Tract: Normal bilateral adrenal glands. Symmetric enhancement of both kidneys without obstructive uropathy. Normal bladder. Stomach/Bowel: Small hiatal hernia. Physiologic distention of the stomach. Mild small bowel distention with fluid with slight fold thickening in the  central abdomen compatible with an enteritis. No mechanical source of obstruction is seen. Decompressed descending colon through rectum. Liquid stool noted within the cecum and ascending colon. No evidence of appendicitis. Vascular/Lymphatic: Aortoiliac and branch vessel atherosclerosis without aneurysm or dissection. Reproductive: Prostate is unremarkable. Other: No abdominal wall hernia or abnormality. No abdominopelvic ascites. Musculoskeletal: Degenerative disc disease L5-S1. No acute nor suspicious osseous abnormality. IMPRESSION: 1. Nonspecific faint ground-glass opacities are seen in both upper lobes left greater than right as well as lingula. Findings may represent small foci of alveolitis or pneumonitis are nonspecific. 2. Minimal coronary arteriosclerosis. 3. Mild fluid distention of small bowel without mechanical obstruction suspicious for mild small bowel enteritis. 4. Morphologic appearance of cirrhosis. 5. Three subcentimeter hypodensities in the right hepatic lobe are noted too small to further characterize but statistically consistent with cysts or hemangiomata. 6. Degenerative disc disease L5-S1. Electronically Signed   By: Tollie Eth M.D.   On: 03/04/2017 18:02   Ct Abdomen Pelvis W Contrast  Result Date: 03/04/2017 CLINICAL DATA:  Unintended weight loss. Nonlocalized abdominal pain. Recent nausea and vomiting. History of  daily ETOH use and seizures. EXAM: CT CHEST, ABDOMEN, AND PELVIS WITH CONTRAST TECHNIQUE: Multidetector CT imaging of the chest, abdomen and pelvis was performed following the standard protocol during bolus administration of intravenous contrast. CONTRAST:  100mL ISOVUE-300 IOPAMIDOL (ISOVUE-300) INJECTION 61% COMPARISON:  CT AP 08/06/2009 FINDINGS: CT CHEST FINDINGS Cardiovascular: Normal branch pattern of the great vessels. No aortic aneurysm or dissection. No large central pulmonary embolus. Normal sized heart without pericardial effusion. Minimal coronary arteriosclerosis  along the circumflex. Mediastinum/Nodes: No enlarged mediastinal, hilar, or axillary lymph nodes. Thyroid gland, trachea, and esophagus demonstrate no significant findings. Lungs/Pleura: Minimal scarring and/or atelectasis at the apices. Faint nonspecific ground-glass opacities in both upper lobes and lingula with subpleural areas of atelectasis and/or scarring in the right middle lobe. No effusion or pneumothorax. Musculoskeletal: Bilateral gynecomastia. CT ABDOMEN PELVIS FINDINGS Hepatobiliary: Subtle surface nodularity of the liver that may reflect morphologic changes of cirrhosis. A pair of adjacent subcentimeter hypodensities in the right hepatic lobe are noted compatible with cysts or hemangiomata. A third similar subcentimeter hypodensity is seen further caudad anteriorly within the right hepatic lobe measuring up to 8 mm. No biliary dilatation. Normal gallbladder. Pancreas: No ductal dilatation, inflammation or mass. Spleen: Normal Adrenals/Urinary Tract: Normal bilateral adrenal glands. Symmetric enhancement of both kidneys without obstructive uropathy. Normal bladder. Stomach/Bowel: Small hiatal hernia. Physiologic distention of the stomach. Mild small bowel distention with fluid with slight fold thickening in the central abdomen compatible with an enteritis. No mechanical source of obstruction is seen. Decompressed descending colon through rectum. Liquid stool noted within the cecum and ascending colon. No evidence of appendicitis. Vascular/Lymphatic: Aortoiliac and branch vessel atherosclerosis without aneurysm or dissection. Reproductive: Prostate is unremarkable. Other: No abdominal wall hernia or abnormality. No abdominopelvic ascites. Musculoskeletal: Degenerative disc disease L5-S1. No acute nor suspicious osseous abnormality. IMPRESSION: 1. Nonspecific faint ground-glass opacities are seen in both upper lobes left greater than right as well as lingula. Findings may represent small foci of  alveolitis or pneumonitis are nonspecific. 2. Minimal coronary arteriosclerosis. 3. Mild fluid distention of small bowel without mechanical obstruction suspicious for mild small bowel enteritis. 4. Morphologic appearance of cirrhosis. 5. Three subcentimeter hypodensities in the right hepatic lobe are noted too small to further characterize but statistically consistent with cysts or hemangiomata. 6. Degenerative disc disease L5-S1. Electronically Signed   By: Tollie Ethavid  Kwon M.D.   On: 03/04/2017 18:02    ____________________________________________   PROCEDURES  Procedure(s) performed: None  Procedures  Critical Care performed: No ____________________________________________   INITIAL IMPRESSION / ASSESSMENT AND PLAN / ED COURSE  Pertinent labs & imaging results that were available during my care of the patient were reviewed by me and considered in my medical decision making (see chart for details).  62 y.o. male with a history of EtOH abuse sent from GI clinic for confusion and somnolence.  Overall, the patient does have symptoms that are concerning for an acute on chronic illness.  With 3 months of weight loss, anorexia, I am concerned about a malignancy.  He is a smoker and alcohol abuser, so I be concerned about liver, colon, lung.  We will get a CT of the chest abdomen and pelvis for evaluation.  Given his confusion and somnolence, will get also get a CT of the head.  Other etiologies including hyperammonemia, alcohol, or other tox could cause this as well.  Will get a CBG.  Electrolytes and blood counts will also be obtained.  Patient does have some wheezing, likely due to  his chronic tobacco use, and I will treat him with a DuoNeb.  His O2 sats are reassuring.  Plan reevaluation for final disposition.  ----------------------------------------- 5:33 PM on 03/04/2017 -----------------------------------------  The patient is hyponatremic and hypokalemic, consistent with dehydration and  nausea vomiting and diarrhea.  His hepatic function panel is reassuring but his bilirubin is elevated at 1.6.  His troponin is negative.  His chest x-ray does not show any acute process.  ----------------------------------------- 8:26 PM on 03/04/2017 -----------------------------------------  The patient is upset that his CT scan shows cirrhosis of the liver, and states that he is leaving the hospital because of this.  I had a long discussion with the patient about the multiple reasons I wanted to admit him to the hospital, including to improve his electrolyte disturbances, rehydrate him, and continue to monitor his symptoms.  I told him that I was concerned that if he left his symptoms would worsen and that he still would not be able to undergo the important testing that he has scheduled with the GI physicians.  I did also tell him that his conditions could lead to death.  He understands the risks and is willing to tolerate them.  He is leaving AGAINST MEDICAL ADVICE at this time.  ____________________________________________  FINAL CLINICAL IMPRESSION(S) / ED DIAGNOSES  Final diagnoses:  Hyponatremia  Hypokalemia  Wheezing  Nausea vomiting and diarrhea  Dehydration         NEW MEDICATIONS STARTED DURING THIS VISIT:  New Prescriptions   LOPERAMIDE (IMODIUM A-D) 2 MG TABLET    Take 1 tablet (2 mg total) by mouth 4 (four) times daily as needed for diarrhea or loose stools.   ONDANSETRON (ZOFRAN ODT) 4 MG DISINTEGRATING TABLET    Take 1 tablet (4 mg total) by mouth every 8 (eight) hours as needed for nausea or vomiting.      Rockne Menghini, MD 03/04/17 1734    Rockne Menghini, MD 03/04/17 1610    Rockne Menghini, MD 03/04/17 2030

## 2017-03-12 ENCOUNTER — Inpatient Hospital Stay
Admission: EM | Admit: 2017-03-12 | Discharge: 2017-03-14 | DRG: 641 | Disposition: A | Discharge status: Home / Self Care | Payer: Medicare Other | Attending: Internal Medicine | Admitting: Internal Medicine

## 2017-03-12 ENCOUNTER — Encounter: Payer: Self-pay | Admitting: Emergency Medicine

## 2017-03-12 ENCOUNTER — Other Ambulatory Visit: Payer: Self-pay

## 2017-03-12 DIAGNOSIS — Z833 Family history of diabetes mellitus: Secondary | ICD-10-CM | POA: Diagnosis not present

## 2017-03-12 DIAGNOSIS — F1010 Alcohol abuse, uncomplicated: Secondary | ICD-10-CM | POA: Diagnosis present

## 2017-03-12 DIAGNOSIS — E860 Dehydration: Secondary | ICD-10-CM | POA: Diagnosis present

## 2017-03-12 DIAGNOSIS — E876 Hypokalemia: Secondary | ICD-10-CM | POA: Diagnosis present

## 2017-03-12 DIAGNOSIS — F329 Major depressive disorder, single episode, unspecified: Secondary | ICD-10-CM | POA: Diagnosis present

## 2017-03-12 DIAGNOSIS — F419 Anxiety disorder, unspecified: Secondary | ICD-10-CM | POA: Diagnosis present

## 2017-03-12 DIAGNOSIS — Z803 Family history of malignant neoplasm of breast: Secondary | ICD-10-CM

## 2017-03-12 DIAGNOSIS — J449 Chronic obstructive pulmonary disease, unspecified: Secondary | ICD-10-CM | POA: Diagnosis present

## 2017-03-12 DIAGNOSIS — R531 Weakness: Secondary | ICD-10-CM

## 2017-03-12 DIAGNOSIS — G40909 Epilepsy, unspecified, not intractable, without status epilepticus: Secondary | ICD-10-CM | POA: Diagnosis present

## 2017-03-12 DIAGNOSIS — M1990 Unspecified osteoarthritis, unspecified site: Secondary | ICD-10-CM | POA: Diagnosis present

## 2017-03-12 DIAGNOSIS — Z885 Allergy status to narcotic agent status: Secondary | ICD-10-CM

## 2017-03-12 DIAGNOSIS — E871 Hypo-osmolality and hyponatremia: Secondary | ICD-10-CM | POA: Diagnosis present

## 2017-03-12 DIAGNOSIS — F17210 Nicotine dependence, cigarettes, uncomplicated: Secondary | ICD-10-CM | POA: Diagnosis present

## 2017-03-12 DIAGNOSIS — K219 Gastro-esophageal reflux disease without esophagitis: Secondary | ICD-10-CM | POA: Diagnosis present

## 2017-03-12 DIAGNOSIS — K529 Noninfective gastroenteritis and colitis, unspecified: Secondary | ICD-10-CM | POA: Diagnosis present

## 2017-03-12 LAB — BASIC METABOLIC PANEL
ANION GAP: 12 (ref 5–15)
BUN: 10 mg/dL (ref 6–20)
CO2: 21 mmol/L — ABNORMAL LOW (ref 22–32)
Calcium: 8.6 mg/dL — ABNORMAL LOW (ref 8.9–10.3)
Chloride: 94 mmol/L — ABNORMAL LOW (ref 101–111)
Creatinine, Ser: 1.03 mg/dL (ref 0.61–1.24)
GFR calc Af Amer: >60 mL/min (ref >60)
Glucose, Bld: 101 mg/dL — ABNORMAL HIGH (ref 65–99)
Potassium: 3.1 mmol/L — ABNORMAL LOW (ref 3.5–5.1)
SODIUM: 127 mmol/L — AB (ref 135–145)

## 2017-03-12 LAB — URINALYSIS, COMPLETE (UACMP) WITH MICROSCOPIC
BACTERIA UA: NONE SEEN
BILIRUBIN URINE: NEGATIVE
GLUCOSE, UA: NEGATIVE mg/dL
Ketones, ur: NEGATIVE mg/dL
LEUKOCYTES UA: NEGATIVE
NITRITE: NEGATIVE
PH: 5 (ref 5.0–8.0)
Protein, ur: NEGATIVE mg/dL
SPECIFIC GRAVITY, URINE: 1.012 (ref 1.005–1.030)
Squamous Epithelial / LPF: NONE SEEN

## 2017-03-12 LAB — HEPATIC FUNCTION PANEL
ALT: 14 U/L — ABNORMAL LOW (ref 17–63)
AST: 32 U/L (ref 15–41)
Albumin: 2.9 g/dL — ABNORMAL LOW (ref 3.5–5.0)
Alkaline Phosphatase: 94 U/L (ref 38–126)
BILIRUBIN DIRECT: 0.4 mg/dL (ref 0.1–0.5)
BILIRUBIN INDIRECT: 1.1 mg/dL — AB (ref 0.3–0.9)
BILIRUBIN TOTAL: 1.5 mg/dL — AB (ref 0.3–1.2)
Total Protein: 7.1 g/dL (ref 6.5–8.1)

## 2017-03-12 LAB — CBC
HEMATOCRIT: 45.3 % (ref 40.0–52.0)
Hemoglobin: 15.5 g/dL (ref 13.0–18.0)
MCH: 33.8 pg (ref 26.0–34.0)
MCHC: 34.3 g/dL (ref 32.0–36.0)
MCV: 98.6 fL (ref 80.0–100.0)
PLATELETS: 237 10*3/uL (ref 150–440)
RBC: 4.59 MIL/uL (ref 4.40–5.90)
RDW: 14.4 % (ref 11.5–14.5)
WBC: 13.2 10*3/uL — AB (ref 3.8–10.6)

## 2017-03-12 MED ORDER — ACETAMINOPHEN 650 MG RE SUPP: 650.0000 mg | Freq: Four times a day (QID) | RECTAL | Status: DC | PRN

## 2017-03-12 MED ORDER — SENNOSIDES-DOCUSATE SODIUM 8.6-50 MG PO TABS: 1.0000 | ORAL_TABLET | Freq: Every evening | ORAL | Status: DC | PRN

## 2017-03-12 MED ORDER — ONDANSETRON HCL 4 MG/2ML IJ SOLN
4.0000 mg | Freq: Four times a day (QID) | INTRAMUSCULAR | Status: DC | PRN
  Administered 2017-03-12: 4 mg via INTRAVENOUS
  Filled 2017-03-12: qty 2

## 2017-03-12 MED ORDER — ONDANSETRON HCL 4 MG PO TABS: 4.0000 mg | ORAL_TABLET | Freq: Four times a day (QID) | ORAL | Status: DC | PRN

## 2017-03-12 MED ORDER — ACETAMINOPHEN 325 MG PO TABS: 650.0000 mg | ORAL_TABLET | Freq: Four times a day (QID) | ORAL | Status: DC | PRN

## 2017-03-12 MED ORDER — POTASSIUM CHLORIDE CRYS ER 20 MEQ PO TBCR
40.0000 meq | EXTENDED_RELEASE_TABLET | Freq: Once | ORAL | Status: AC
  Administered 2017-03-12: 40 meq via ORAL
  Filled 2017-03-12: qty 2

## 2017-03-12 MED ORDER — NICOTINE 21 MG/24HR TD PT24
21.0000 mg | MEDICATED_PATCH | Freq: Once | TRANSDERMAL | Status: AC
  Administered 2017-03-12: 21 mg via TRANSDERMAL
  Filled 2017-03-12: qty 1

## 2017-03-12 MED ORDER — SODIUM CHLORIDE 0.9 % IV SOLN
Freq: Once | INTRAVENOUS | Status: AC
  Administered 2017-03-12: 14:00:00 via INTRAVENOUS

## 2017-03-12 MED ORDER — LEVETIRACETAM 750 MG PO TABS
750.0000 mg | ORAL_TABLET | Freq: Two times a day (BID) | ORAL | Status: DC
  Administered 2017-03-12 – 2017-03-14 (×4): 750 mg via ORAL
  Filled 2017-03-12 (×4): qty 1

## 2017-03-12 MED ORDER — SODIUM CHLORIDE 0.9 % IV SOLN
INTRAVENOUS | Status: DC
  Administered 2017-03-12 (×2): via INTRAVENOUS

## 2017-03-12 MED ORDER — FLUOXETINE HCL 20 MG PO CAPS
20.0000 mg | ORAL_CAPSULE | Freq: Every day | ORAL | Status: DC
  Administered 2017-03-12 – 2017-03-13 (×2): 20 mg via ORAL
  Filled 2017-03-12 (×2): qty 1

## 2017-03-12 MED ORDER — ENOXAPARIN SODIUM 40 MG/0.4ML ~~LOC~~ SOLN
40.0000 mg | SUBCUTANEOUS | Status: DC
  Administered 2017-03-12 – 2017-03-13 (×2): 40 mg via SUBCUTANEOUS
  Filled 2017-03-12 (×2): qty 0.4

## 2017-03-12 NOTE — ED Provider Notes (Signed)
[facility] Emergency Department Provider Note       Time seen: ----------------------------------------- 1:43 PM on 03/12/2017 -----------------------------------------   I have reviewed the triage vital signs and the nursing notes.  HISTORY   Chief Complaint Weakness    HPI Marco [name] is a 62 y.o. male with a history of anxiety, arthritis, depression, GERD, seizures and chronic alcohol abuse who presents to the ED for persistent weakness, weight loss and vomiting.  Patient went to his doctor who referred him to the ER for evaluation.  Patient was seen here 8 days ago and refused admission.  He was seen to be hyponatremic with possible cirrhosis at that time.  Patient states he has not had alcohol since his previous evaluation.  Past Medical History:  Diagnosis Date  . Anxiety   . Arthritis   . Depression   . Heartburn   . Seizures [facility](HCC)     Patient Active Problem List   Diagnosis Date Noted  . Acute encephalopathy 03/04/2017    Past Surgical History:  Procedure Laterality Date  . KNEE ARTHROSCOPY  1975    Allergies Hydrocodone-acetaminophen  Social History Social History   Tobacco Use  . Smoking status: Current Every Day Smoker    Packs/day: 1.00  . Smokeless tobacco: Never Used  Substance Use Topics  . Alcohol use: Yes    Alcohol/week: 2.4 oz    Types: 4 Cans of beer per week  . Drug use: No   Review of Systems Constitutional: Negative for fever. Cardiovascular: Negative for chest pain. Respiratory: Negative for shortness of breath. Gastrointestinal: Negative for abdominal pain, positive for poor appetite with occasional vomiting Genitourinary: Negative for dysuria. Musculoskeletal: Negative for back pain. Skin: Negative for rash. Neurological: Positive for generalized weakness  All systems negative/normal/unremarkable except as stated in the HPI  ____________________________________________   PHYSICAL  EXAM:  VITAL SIGNS: ED Triage Vitals  Enc Vitals Group     BP 03/12/17 1128 128/82     Pulse Rate 03/12/17 1128 98     Resp 03/12/17 1128 18     Temp 03/12/17 1128 97.9 F (36.6 C)     Temp Source 03/12/17 1128 Oral     SpO2 03/12/17 1128 100 %     Weight 03/12/17 1129 140 lb (63.5 kg)     Height 03/12/17 1129 5\' 5"  (1.651 m)     Head Circumference --      Peak Flow --      Pain Score --      Pain Loc --      Pain Edu? --      Excl. in GC? --    Constitutional: Alert and oriented.  No distress Eyes: Conjunctivae are normal. Normal extraocular movements. ENT   Head: Normocephalic and atraumatic.   Nose: No congestion/rhinnorhea.   Mouth/Throat: Mucous membranes are moist.   Neck: No stridor. Cardiovascular: Normal rate, regular rhythm. No murmurs, rubs, or gallops. Respiratory: Normal respiratory effort without tachypnea nor retractions. Breath sounds are clear and equal bilaterally. No wheezes/rales/rhonchi. Gastrointestinal: Soft and nontender. Normal bowel sounds Musculoskeletal: Nontender with normal range of motion in extremities. No lower extremity tenderness nor edema. Neurologic:  Normal speech and language. No gross focal neurologic deficits are appreciated.  Skin:  Skin is warm, dry and intact.  Slight jaundice is noted Psychiatric: Mood and affect are normal. Speech and behavior are normal.  ____________________________________________  EKG: Interpreted by me.  Sinus tachycardia rate of 103 bpm, normal PR interval, normal QRS,  normal QT, left anterior fascicular block  ____________________________________________  ED COURSE:  As part of my medical decision making, I reviewed the following data within the electronic MEDICAL RECORD NUMBER History obtained from family if available, nursing notes, old chart and ekg, as well as notes from prior ED visits. Patient presented for weakness with weight loss and poor appetite, we will assess with labs and imaging as  indicated at this time.   Procedures ____________________________________________   LABS (pertinent positives/negatives)  Labs Reviewed  BASIC METABOLIC PANEL - Abnormal; Notable for the following components:      Result Value   Sodium 127 (*)    Potassium 3.1 (*)    Chloride 94 (*)    CO2 21 (*)    Glucose, Bld 101 (*)    Calcium 8.6 (*)    All other components within normal limits  CBC - Abnormal; Notable for the following components:   WBC 13.2 (*)    All other components within normal limits  URINALYSIS, COMPLETE (UACMP) WITH MICROSCOPIC - Abnormal; Notable for the following components:   Color, Urine AMBER (*)    APPearance CLEAR (*)    Hgb urine dipstick MODERATE (*)    All other components within normal limits  HEPATIC FUNCTION PANEL  ____________________________________________  DIFFERENTIAL DIAGNOSIS   Chronic alcohol abuse, dehydration, electrolyte abnormality, cirrhosis  FINAL ASSESSMENT AND PLAN  Hyponatremia, weakness   Plan: The patient had presented for persistent weakness with hyponatremia. Patient's labs did not reveal any significant change from prior. Patient's imaging from previous admission revealed possible cirrhosis.  Previously he had declined admission but is willing to be admitted today.  I have started him on a saline infusion.  I will discuss with the hospitalist for admission.   Ulice Dash, MD   Note: This note was generated in part or whole with voice recognition software. Voice recognition is usually quite accurate but there are transcription errors that can and very often do occur. I apologize for any typographical errors that were not detected and corrected.     Emily Filbert, MD 03/12/17 1346

## 2017-03-12 NOTE — H&P (Signed)
[facility] [place] at [facility]   PATIENT NAME: Marco [name]    MR#:  [id number]  DATE OF BIRTH:  07-27-1955  DATE OF ADMISSION:  03/12/2017  PRIMARY CARE PHYSICIAN: Jerl Mina, MD   REQUESTING/REFERRING PHYSICIAN:   CHIEF COMPLAINT:   Chief Complaint  Patient presents with  . Weakness    HISTORY OF PRESENT ILLNESS: Marco [name]  is a 62 y.o. male with a known history of anxiety disorder, depression, seizure disorder, arthritis was referred from primary care physician office to the emergency room for nausea and vomiting.  Patient also felt weak and tired and was not able to eat any food and drink any fluids secondary to vomiting.  He was evaluated in the emergency room a week ago but he refused admission.  Today he was seen again and was found to have low sodium level of 128 and potassium level was 3.1.  He was given oral potassium in the emergency room and started on IV fluids.  He is compliant with his Keppra medication for seizures.  No complaints of any chest pain, shortness of breath.  No new seizures.  No headache.  PAST MEDICAL HISTORY:   Past Medical History:  Diagnosis Date  . Anxiety   . Arthritis   . Depression   . Heartburn   . Seizures (HCC)     PAST SURGICAL HISTORY:  Past Surgical History:  Procedure Laterality Date  . KNEE ARTHROSCOPY  1975    SOCIAL HISTORY:  Social History   Tobacco Use  . Smoking status: Current Every Day Smoker    Packs/day: 1.00  . Smokeless tobacco: Never Used  Substance Use Topics  . Alcohol use: Yes    Alcohol/week: 2.4 oz    Types: 4 Cans of beer per week    FAMILY HISTORY:  Family History  Problem Relation Age of Onset  . Diabetes Mellitus II Mother   . Breast cancer Mother   . Kidney disease Neg Hx   . Prostate cancer Neg Hx     DRUG ALLERGIES:  Allergies  Allergen Reactions  . Hydrocodone-Acetaminophen Nausea Only and Nausea And Vomiting    REVIEW OF SYSTEMS:    CONSTITUTIONAL: No fever, has fatigue and weakness.  EYES: No blurred or double vision.  EARS, NOSE, AND THROAT: No tinnitus or ear pain.  RESPIRATORY: No cough, shortness of breath, wheezing or hemoptysis.  CARDIOVASCULAR: No chest pain, orthopnea, edema.  GASTROINTESTINAL: Has nausea, vomiting,  No diarrhea or abdominal pain.  GENITOURINARY: No dysuria, hematuria.  ENDOCRINE: No polyuria, nocturia,  HEMATOLOGY: No anemia, easy bruising or bleeding SKIN: No rash or lesion. MUSCULOSKELETAL: No joint pain or arthritis.   NEUROLOGIC: No tingling, numbness, weakness.  PSYCHIATRY: No anxiety or depression.   MEDICATIONS AT HOME:  Prior to Admission medications   Medication Sig Start Date End Date Taking? Authorizing Provider  FLUoxetine (PROZAC) 20 MG capsule Take 20 mg by mouth daily as needed. 05/15/16  Yes [provider]  levETIRAcetam (KEPPRA) 750 MG tablet Take 750 mg by mouth 2 (two) times daily.  01/20/15  Yes [provider]  loperamide (IMODIUM A-D) 2 MG tablet Take 1 tablet (2 mg total) by mouth 4 (four) times daily as needed for diarrhea or loose stools. 03/04/17  Yes Rockne Menghini, MD  ondansetron (ZOFRAN ODT) 4 MG disintegrating tablet Take 1 tablet (4 mg total) by mouth every 8 (eight) hours as needed for nausea or vomiting. 03/04/17  Yes Rockne Menghini, MD  fluticasone (FLONASE) 50 MCG/ACT nasal spray Place 2 sprays into both nostrils daily. Patient not taking: Reported on 03/04/2017 07/23/15 07/22/16  Kem Boroughsriplett, Cari B, FNP      PHYSICAL EXAMINATION:   VITAL SIGNS: Blood pressure 128/82, pulse 98, temperature 97.9 F (36.6 C), temperature source Oral, resp. rate 18, height 5\' 5"  (1.651 m), weight 63.5 kg (140 lb), SpO2 100 %.  GENERAL:  62 y.o.-year-old patient lying in the bed with no acute distress.  EYES: Pupils equal, round, reactive to light and accommodation. No scleral icterus. Extraocular muscles intact.  HEENT: Head atraumatic,  normocephalic. Oropharynx dry and nasopharynx clear.  NECK:  Supple, no jugular venous distention. No thyroid enlargement, no tenderness.  LUNGS: Normal breath sounds bilaterally, no wheezing, rales,rhonchi or crepitation. No use of accessory muscles of respiration.  CARDIOVASCULAR: S1, S2 normal. No murmurs, rubs, or gallops.  ABDOMEN: Soft, nontender, nondistended. Bowel sounds present. No organomegaly or mass.  EXTREMITIES: No pedal edema, cyanosis, or clubbing.  NEUROLOGIC: Cranial nerves II through XII are intact. Muscle strength 5/5 in all extremities. Sensation intact. Gait not checked.  PSYCHIATRIC: The patient is alert and oriented x 3.  SKIN: No obvious rash, lesion, or ulcer.   LABORATORY PANEL:   CBC Recent Labs  Lab 03/12/17 1139  WBC 13.2*  HGB 15.5  HCT 45.3  PLT 237  MCV 98.6  MCH 33.8  MCHC 34.3  RDW 14.4   ------------------------------------------------------------------------------------------------------------------  Chemistries  Recent Labs  Lab 03/12/17 1139  NA 127*  K 3.1*  CL 94*  CO2 21*  GLUCOSE 101*  BUN 10  CREATININE 1.03  CALCIUM 8.6*  AST 32  ALT 14*  ALKPHOS 94  BILITOT 1.5*   ------------------------------------------------------------------------------------------------------------------ estimated creatinine clearance is 65.5 mL/min (by C-G formula based on SCr of 1.03 mg/dL). ------------------------------------------------------------------------------------------------------------------ No results for input(s): TSH, T4TOTAL, T3FREE, THYROIDAB in the last 72 hours.  Invalid input(s): FREET3   Coagulation profile No results for input(s): INR, PROTIME in the last 168 hours. ------------------------------------------------------------------------------------------------------------------- No results for input(s): DDIMER in the last 72  hours. -------------------------------------------------------------------------------------------------------------------  Cardiac Enzymes No results for input(s): CKMB, TROPONINI, MYOGLOBIN in the last 168 hours.  Invalid input(s): CK ------------------------------------------------------------------------------------------------------------------ Invalid input(s): POCBNP  ---------------------------------------------------------------------------------------------------------------  Urinalysis    Component Value Date/Time   COLORURINE AMBER (A) 03/12/2017 1208   APPEARANCEUR CLEAR (A) 03/12/2017 1208   APPEARANCEUR Clear 12/17/2012 1852   LABSPEC 1.012 03/12/2017 1208   LABSPEC 1.018 12/17/2012 1852   PHURINE 5.0 03/12/2017 1208   GLUCOSEU NEGATIVE 03/12/2017 1208   GLUCOSEU Negative 12/17/2012 1852   HGBUR MODERATE (A) 03/12/2017 1208   BILIRUBINUR NEGATIVE 03/12/2017 1208   BILIRUBINUR Negative 12/17/2012 1852   KETONESUR NEGATIVE 03/12/2017 1208   PROTEINUR NEGATIVE 03/12/2017 1208   NITRITE NEGATIVE 03/12/2017 1208   LEUKOCYTESUR NEGATIVE 03/12/2017 1208   LEUKOCYTESUR Negative 12/17/2012 1852     RADIOLOGY: No results found.  EKG: Orders placed or performed during the hospital encounter of 03/12/17  . ED EKG  . ED EKG    IMPRESSION AND PLAN:  62 year old male patient with history of seizure disorder, anxiety disorder, arthritis presented to the emergency room with generalized weakness, nausea and vomiting.  1.  Dehydration IV fluids  2.  Hyponatremia Normal saline hydration Follow-up electrolytes  3.  Hypokalemia Replace potassium orally in the emergency room Follow-up lipids  4.  Seizure disorder  continue oral Keppra  5.  DVT prophylaxis Lovenox 40 subcu daily   All the records are reviewed and case discussed  with ED provider. Management plans discussed with the patient, family and they are in agreement.  CODE STATUS:DNR Code Status  History    Date Active Date Inactive Code Status Order ID Comments User Context   03/04/2017 19:23 03/04/2017 23:55 DNR [id number]  Alford Highland, MD ED    Questions for Most Recent Historical Code Status (Order [id number])    Question Answer Comment   In the event of cardiac or respiratory ARREST Do not call a "code blue"    In the event of cardiac or respiratory ARREST Do not perform Intubation, CPR, defibrillation or ACLS    In the event of cardiac or respiratory ARREST Use medication by any route, position, wound care, and other measures to relive pain and suffering. May use oxygen, suction and manual treatment of airway obstruction as needed for comfort.    Comments nurse may pronounce        TOTAL TIME TAKING CARE OF THIS PATIENT: 50 minutes.    Ihor Austin M.D on 03/12/2017 at 2:33 PM  Between 7am to 6pm - Pager - [phone]  After 6pm go to [email or web address] - password [email or web address] [facility]  [facility] [place] [facility]  Office  [phone]  CC: Primary care physician; Jerl Mina, MD

## 2017-03-12 NOTE — ED Triage Notes (Signed)
Seen here last week and refused admission.  Went to dr hedrick today and they sent him here.  Still having vomiting, wt loss since ed visit.

## 2017-03-13 LAB — BASIC METABOLIC PANEL
Anion gap: 6 (ref 5–15)
BUN: 8 mg/dL (ref 6–20)
CO2: 23 mmol/L (ref 22–32)
Calcium: 8.2 mg/dL — ABNORMAL LOW (ref 8.9–10.3)
Chloride: 101 mmol/L (ref 101–111)
Creatinine, Ser: 0.77 mg/dL (ref 0.61–1.24)
GFR calc Af Amer: >60 mL/min (ref >60)
GLUCOSE: 95 mg/dL (ref 65–99)
POTASSIUM: 3.1 mmol/L — AB (ref 3.5–5.1)
Sodium: 130 mmol/L — ABNORMAL LOW (ref 135–145)

## 2017-03-13 LAB — TSH: TSH: 2.163 u[IU]/mL (ref 0.350–4.500)

## 2017-03-13 LAB — MAGNESIUM: Magnesium: 1.8 mg/dL (ref 1.7–2.4)

## 2017-03-13 MED ORDER — PANTOPRAZOLE SODIUM 40 MG PO TBEC
40.0000 mg | DELAYED_RELEASE_TABLET | Freq: Every day | ORAL | Status: DC
  Administered 2017-03-13 – 2017-03-14 (×2): 40 mg via ORAL
  Filled 2017-03-13 (×2): qty 1

## 2017-03-13 MED ORDER — MOMETASONE FURO-FORMOTEROL FUM 200-5 MCG/ACT IN AERO
2.0000 | INHALATION_SPRAY | Freq: Two times a day (BID) | RESPIRATORY_TRACT | Status: DC
  Administered 2017-03-13 – 2017-03-14 (×3): 2 via RESPIRATORY_TRACT
  Filled 2017-03-13: qty 8.8

## 2017-03-13 MED ORDER — NICOTINE 21 MG/24HR TD PT24
21.0000 mg | MEDICATED_PATCH | Freq: Every day | TRANSDERMAL | Status: DC
  Administered 2017-03-13 – 2017-03-14 (×2): 21 mg via TRANSDERMAL
  Filled 2017-03-13 (×2): qty 1

## 2017-03-13 MED ORDER — POTASSIUM CHLORIDE CRYS ER 20 MEQ PO TBCR
40.0000 meq | EXTENDED_RELEASE_TABLET | ORAL | Status: AC
  Administered 2017-03-13 (×2): 40 meq via ORAL
  Filled 2017-03-13 (×2): qty 2

## 2017-03-13 MED ORDER — ALUM & MAG HYDROXIDE-SIMETH 200-200-20 MG/5ML PO SUSP
30.0000 mL | Freq: Four times a day (QID) | ORAL | Status: DC | PRN
  Administered 2017-03-13: 30 mL via ORAL
  Filled 2017-03-13: qty 30

## 2017-03-13 MED ORDER — IPRATROPIUM-ALBUTEROL 0.5-2.5 (3) MG/3ML IN SOLN
3.0000 mL | Freq: Four times a day (QID) | RESPIRATORY_TRACT | Status: DC | PRN
  Administered 2017-03-13: 3 mL via RESPIRATORY_TRACT
  Filled 2017-03-13: qty 3

## 2017-03-13 NOTE — Progress Notes (Signed)
Contacted to speak with patient about Hospital Smoking Policy. Was told patient was looking for an ashtray to smoke in the room. RN had explained to patient smoking was not permitted on hospital property.Patient became angry, told that RN to get out of his room.Patient initially requested to see policy, then said he didn't need to see it. Introduced self to patient. Explained smoking was not allowed on hospital premises, outside or in room.Patient states he has been smoking every time he came here, wanting to know when it was changed. Told him it was many years ago.Demanded to see "a newspaper with that message." Explained I could review the policy with the patient, he stated he didn't want to see it. Stated "dont talk to me like I am stupid, I know you have to go outside to smoke." Explained could not smoke on hospital premises. Patient told me to get out of his room.

## 2017-03-13 NOTE — Progress Notes (Signed)
PT Cancellation Note  Patient Details Name: Marco [name] MRN: [id number] DOB: 03-24-1955   Cancelled Treatment:    Reason Eval/Treat Not Completed: Patient declined, no reason specified.  PT consult received.  Chart reviewed.  Pt declining physical therapy d/t being too tired (nursing and MD notified).  Will re-attempt PT evaluation at a later date/time as medically appropriate.  Hendricks LimesEmily Palma Buster, PT 03/13/17, 11:36 AM [phone]

## 2017-03-13 NOTE — Plan of Care (Signed)
  Progressing Clinical Measurements: Will remain free from infection 03/13/2017 1706 - Progressing by Rigoberto NoelMorales, Clearence Vitug Y, RN Diagnostic test results will improve 03/13/2017 1706 - Progressing by Rigoberto NoelMorales, Seve Monette Y, RN Respiratory complications will improve 03/13/2017 1706 - Progressing by Rigoberto NoelMorales, Kiowa Peifer Y, RN

## 2017-03-13 NOTE — Progress Notes (Signed)
[facility] [place] at [facility]   PATIENT NAME: Marco RegisterMichael [name]    MR#:  [id number]  DATE OF BIRTH:  [birth year]  SUBJECTIVE:  CHIEF COMPLAINT:   Chief Complaint  Patient presents with  . Weakness   -still has some nausea, no abdominal pain or vomiting. -wheezing on exam  REVIEW OF SYSTEMS:  Review of Systems  Constitutional: Positive for malaise/fatigue. Negative for chills and fever.  HENT: Negative for congestion, ear discharge, hearing loss and nosebleeds.   Eyes: Negative for blurred vision and double vision.  Respiratory: Negative for cough, shortness of breath and wheezing.   Cardiovascular: Negative for chest pain, palpitations and leg swelling.  Gastrointestinal: Positive for nausea. Negative for abdominal pain, constipation, diarrhea and vomiting.  Genitourinary: Negative for dysuria.  Musculoskeletal: Negative for myalgias.  Neurological: Negative for dizziness, speech change, focal weakness, seizures and headaches.  Psychiatric/Behavioral: Negative for depression.    DRUG ALLERGIES:   Allergies  Allergen Reactions  . Hydrocodone-Acetaminophen Nausea Only and Nausea And Vomiting    VITALS:  Blood pressure 117/71, pulse 87, temperature 98.2 F (36.8 C), temperature source Oral, resp. rate 18, height 5\' 5"  (1.651 m), weight 63.5 kg (140 lb), SpO2 100 %.  PHYSICAL EXAMINATION:  Physical Exam  GENERAL:  62 y.o.-year-old patient lying in the bed with no acute distress.  EYES: Pupils equal, round, reactive to light and accommodation. No scleral icterus. Extraocular muscles intact.  HEENT: Head atraumatic, normocephalic. Oropharynx and nasopharynx clear.  NECK:  Supple, no jugular venous distention. No thyroid enlargement, no tenderness.  LUNGS: diffuse expiratory wheezing on exam all over the lung fields, moving air bilaterally, no rales,rhonchi or crepitation. No use of accessory muscles of respiration.  CARDIOVASCULAR: S1, S2 normal. No  murmurs, rubs, or gallops.  ABDOMEN: Soft, nontender, nondistended. Bowel sounds present. No organomegaly or mass.  EXTREMITIES: No pedal edema, cyanosis, or clubbing.  NEUROLOGIC: Cranial nerves II through XII are intact. Muscle strength 5/5 in all extremities. Sensation intact. Gait not checked.  PSYCHIATRIC: The patient is alert and oriented x 3.  SKIN: No obvious rash, lesion, or ulcer.    LABORATORY PANEL:   CBC Recent Labs  Lab 03/12/17 1139  WBC 13.2*  HGB 15.5  HCT 45.3  PLT 237   ------------------------------------------------------------------------------------------------------------------  Chemistries  Recent Labs  Lab 03/12/17 1139 03/13/17 0530  NA 127* 130*  K 3.1* 3.1*  CL 94* 101  CO2 21* 23  GLUCOSE 101* 95  BUN 10 8  CREATININE 1.03 0.77  CALCIUM 8.6* 8.2*  AST 32  --   ALT 14*  --   ALKPHOS 94  --   BILITOT 1.5*  --    ------------------------------------------------------------------------------------------------------------------  Cardiac Enzymes No results for input(s): TROPONINI in the last 168 hours. ------------------------------------------------------------------------------------------------------------------  RADIOLOGY:  No results found.  EKG:   Orders placed or performed during the hospital encounter of 03/12/17  . ED EKG  . ED EKG    ASSESSMENT AND PLAN:   62 year old male with past medical history significant for depression and anxiety, seizures, ongoing smoking presents to hospital secondary to nausea, vomiting and weakness  1. Acute gastroenteritis-going on for 2 weeks now. -Symptoms are much improved. Receiving IV fluids -Advance diet  2. Hypokalemia and hyponatremia-received IV fluids. Replaced. -Monitor with his liver cirrhosis, might have chronic hyponatremia  3. Tobacco use disorder- counseled Likely has underlying COPD with his wheezing - added dulera, duonebs prn - no indication for systemic steroids -  nicotine patch  4. GERD- maalox and protonix  5. Seizure disorder- continue keppra  6. DVT Prophylaxis- lovenox  PT consult for weakness    All the records are reviewed and case discussed with Care Management/Social Workerr. Management plans discussed with the patient, family and they are in agreement.  CODE STATUS: Full Code  TOTAL TIME TAKING CARE OF THIS PATIENT: 38 minutes.   POSSIBLE D/C IN 1-2 DAYS, DEPENDING ON CLINICAL CONDITION.   Enid Baas M.D on 03/13/2017 at 12:26 PM  Between 7am to 6pm - Pager - [phone]  After 6pm go to [email or web address] - Social research officer, government  [facility] [place] [facility]  Office  [phone]  CC: Primary care physician; Jerl Mina, MD

## 2017-03-14 LAB — BASIC METABOLIC PANEL
ANION GAP: 8 (ref 5–15)
BUN: 6 mg/dL (ref 6–20)
CO2: 20 mmol/L — AB (ref 22–32)
Calcium: 8.3 mg/dL — ABNORMAL LOW (ref 8.9–10.3)
Chloride: 106 mmol/L (ref 101–111)
Creatinine, Ser: 0.83 mg/dL (ref 0.61–1.24)
GLUCOSE: 97 mg/dL (ref 65–99)
POTASSIUM: 4.2 mmol/L (ref 3.5–5.1)
Sodium: 134 mmol/L — ABNORMAL LOW (ref 135–145)

## 2017-03-14 LAB — HIV ANTIBODY (ROUTINE TESTING W REFLEX): HIV SCREEN 4TH GENERATION: NONREACTIVE

## 2017-03-14 MED ORDER — MOMETASONE FURO-FORMOTEROL FUM 200-5 MCG/ACT IN AERO: 2.0000 | INHALATION_SPRAY | Freq: Two times a day (BID) | RESPIRATORY_TRACT | 2 refills | Status: DC

## 2017-03-14 MED ORDER — PANTOPRAZOLE SODIUM 40 MG PO TBEC: 40.0000 mg | DELAYED_RELEASE_TABLET | Freq: Every day | ORAL | 0 refills | Status: DC

## 2017-03-14 MED ORDER — ALBUTEROL SULFATE HFA 108 (90 BASE) MCG/ACT IN AERS: 2.0000 | INHALATION_SPRAY | Freq: Four times a day (QID) | RESPIRATORY_TRACT | 2 refills | Status: DC | PRN

## 2017-03-14 NOTE — Progress Notes (Signed)
Pt in no acute distress at this time. VSS. Pt denies pain. RN explained discharge teaching to pt and pt family member, pt verbalized understanding. Pt wheeled to visitors entrance by volunteer services and assisted into vehicle.

## 2017-03-14 NOTE — Care Management Important Message (Signed)
Important Message  Patient Details  Name: Marco [name] MRN: [id number] Date of Birth: March 27, 1955   Medicare Important Message Given:  Yes    Olegario MessierKathy A Trine Fread 03/14/2017, 9:53 AM

## 2017-03-14 NOTE — Discharge Summary (Signed)
[facility] - [place] at [facility]   PATIENT NAME: Marco [name]    MR#:  [id number]  DATE OF BIRTH:  [birth year]  DATE OF ADMISSION:  03/12/2017   ADMITTING PHYSICIAN: Ihor Austin, MD  DATE OF DISCHARGE:  03/14/17  PRIMARY CARE PHYSICIAN: Jerl Mina, MD   ADMISSION DIAGNOSIS:   Hyponatremia [E87.1] Weakness [R53.1]  DISCHARGE DIAGNOSIS:   Active Problems:   Hyponatremia   SECONDARY DIAGNOSIS:   Past Medical History:  Diagnosis Date  . Anxiety   . Arthritis   . Depression   . Heartburn   . Seizures [facility])     HOSPITAL COURSE:   62 year old male with past medical history significant for depression and anxiety, seizures, ongoing smoking presents to hospital secondary to nausea, vomiting and weakness  1. Acute gastroenteritis-going on for 2 weeks now. -Symptoms are improved. Received IV fluids- off since yesterday, labs improved -Advanced diet- tolerating well  2. Hypokalemia and hyponatremia-received IV fluids. Replaced. -Monitor with his liver cirrhosis, might have chronic hyponatremia Levels improved at discharge  3. Tobacco use disorder- counseled Likely has underlying COPD with his wheezing - added dulera, albuterol inhaler prn  - no indication for systemic steroids - nicotine patch while in the hospital.  4. GERD- started on protonix  5. Seizure disorder- continue keppra  PT consulted, patient ambulating well Discharge home today    DISCHARGE CONDITIONS:   Guarded  CONSULTS OBTAINED:   None  DRUG ALLERGIES:   Allergies  Allergen Reactions  . Hydrocodone-Acetaminophen Nausea Only and Nausea And Vomiting   DISCHARGE MEDICATIONS:   Allergies as of 03/14/2017      Reactions   Hydrocodone-acetaminophen Nausea Only, Nausea And Vomiting      Medication List    STOP taking these medications   fluticasone 50 MCG/ACT nasal spray Commonly known as:  FLONASE     TAKE these medications   albuterol 108 (90  Base) MCG/ACT inhaler Commonly known as:  PROVENTIL HFA;VENTOLIN HFA Inhale 2 puffs into the lungs every 6 (six) hours as needed for wheezing or shortness of breath.   FLUoxetine 20 MG capsule Commonly known as:  PROZAC Take 20 mg by mouth daily as needed.   levETIRAcetam 750 MG tablet Commonly known as:  KEPPRA Take 750 mg by mouth 2 (two) times daily.   loperamide 2 MG tablet Commonly known as:  IMODIUM A-D Take 1 tablet (2 mg total) by mouth 4 (four) times daily as needed for diarrhea or loose stools.   mometasone-formoterol 200-5 MCG/ACT Aero Commonly known as:  DULERA Inhale 2 puffs into the lungs 2 (two) times daily.   ondansetron 4 MG disintegrating tablet Commonly known as:  ZOFRAN ODT Take 1 tablet (4 mg total) by mouth every 8 (eight) hours as needed for nausea or vomiting.   pantoprazole 40 MG tablet Commonly known as:  PROTONIX Take 1 tablet (40 mg total) by mouth daily. Start taking on:  03/15/2017        DISCHARGE INSTRUCTIONS:   1. PCP follow-up in 1-2 weeks  DIET:   Cardiac diet  ACTIVITY:   Activity as tolerated  OXYGEN:   Home Oxygen: No.  Oxygen Delivery: room air  DISCHARGE LOCATION:   home   If you experience worsening of your admission symptoms, develop shortness of breath, life threatening emergency, suicidal or homicidal thoughts you must seek medical attention immediately by calling 911 or calling your MD immediately  if symptoms less severe.  You Must read complete instructions/literature along  with all the possible adverse reactions/side effects for all the Medicines you take and that have been prescribed to you. Take any new Medicines after you have completely understood and accpet all the possible adverse reactions/side effects.   Please note  You were cared for by a hospitalist during your hospital stay. If you have any questions about your discharge medications or the care you received while you were in the hospital after you  are discharged, you can call the unit and asked to speak with the hospitalist on call if the hospitalist that took care of you is not available. Once you are discharged, your primary care physician will handle any further medical issues. Please note that NO REFILLS for any discharge medications will be authorized once you are discharged, as it is imperative that you return to your primary care physician (or establish a relationship with a primary care physician if you do not have one) for your aftercare needs so that they can reassess your need for medications and monitor your lab values.    On the day of Discharge:  VITAL SIGNS:   Blood pressure 124/77, pulse 95, temperature 98 F (36.7 C), temperature source Oral, resp. rate 16, height 5\' 5"  (1.651 m), weight 63.5 kg (140 lb), SpO2 100 %.  PHYSICAL EXAMINATION:    GENERAL:  62 y.o.-year-old patient lying in the bed with no acute distress.  EYES: Pupils equal, round, reactive to light and accommodation. No scleral icterus. Extraocular muscles intact.  HEENT: Head atraumatic, normocephalic. Oropharynx and nasopharynx clear.  NECK:  Supple, no jugular venous distention. No thyroid enlargement, no tenderness.  LUNGS: still with minimal  expiratory wheezing on exam all over the lung fields, moving air bilaterally, no rales,rhonchi or crepitation. No use of accessory muscles of respiration.  CARDIOVASCULAR: S1, S2 normal. No murmurs, rubs, or gallops.  ABDOMEN: Soft, nontender, nondistended. Bowel sounds present. No organomegaly or mass.  EXTREMITIES: No pedal edema, cyanosis, or clubbing.  NEUROLOGIC: Cranial nerves II through XII are intact. Muscle strength 5/5 in all extremities. Sensation intact. Gait not checked.  PSYCHIATRIC: The patient is alert and oriented x 3.  SKIN: No obvious rash, lesion, or ulcer.      DATA REVIEW:   CBC Recent Labs  Lab 03/12/17 1139  WBC 13.2*  HGB 15.5  HCT 45.3  PLT 237    Chemistries  Recent  Labs  Lab 03/12/17 1139 03/13/17 0530 03/14/17 0433  NA 127* 130* 134*  K 3.1* 3.1* 4.2  CL 94* 101 106  CO2 21* 23 20*  GLUCOSE 101* 95 97  BUN 10 8 6   CREATININE 1.03 0.77 0.83  CALCIUM 8.6* 8.2* 8.3*  MG  --  1.8  --   AST 32  --   --   ALT 14*  --   --   ALKPHOS 94  --   --   BILITOT 1.5*  --   --      Microbiology Results  No results found for this or any previous visit.  RADIOLOGY:  No results found.   Management plans discussed with the patient, family and they are in agreement.  CODE STATUS:     Code Status Orders  (From admission, onward)        Start     Ordered   03/13/17 1005  Full code  Continuous     03/13/17 1004    Code Status History    Date Active Date Inactive Code Status Order ID Comments User  Context   03/12/2017 16:06 03/13/2017 10:04 DNR [id number]  Ihor Austin, MD Inpatient   03/04/2017 19:23 03/04/2017 23:55 DNR [id number]  Alford Highland, MD ED      TOTAL TIME TAKING CARE OF THIS PATIENT: 38 minutes.    Enid Baas M.D on 03/14/2017 at 8:52 AM  Between 7am to 6pm - Pager - [phone]  After 6pm go to [email or web address] - Social research officer, government  [facility] [place] [facility]  Office  [phone]  CC: Primary care physician; Jerl Mina, MD   Note: This dictation was prepared with Dragon dictation along with smaller phrase technology. Any transcriptional errors that result from this process are unintentional.

## 2017-03-17 ENCOUNTER — Other Ambulatory Visit: Payer: Self-pay

## 2017-03-17 ENCOUNTER — Emergency Department
Admission: EM | Admit: 2017-03-17 | Discharge: 2017-03-18 | Disposition: A | Discharge status: Home / Self Care | Payer: Medicare Other | Attending: Emergency Medicine | Admitting: Emergency Medicine

## 2017-03-17 ENCOUNTER — Emergency Department: Payer: Medicare Other

## 2017-03-17 ENCOUNTER — Encounter: Payer: Self-pay | Admitting: Emergency Medicine

## 2017-03-17 DIAGNOSIS — E871 Hypo-osmolality and hyponatremia: Secondary | ICD-10-CM | POA: Diagnosis not present

## 2017-03-17 DIAGNOSIS — F17200 Nicotine dependence, unspecified, uncomplicated: Secondary | ICD-10-CM | POA: Diagnosis not present

## 2017-03-17 DIAGNOSIS — R410 Disorientation, unspecified: Secondary | ICD-10-CM | POA: Diagnosis present

## 2017-03-17 DIAGNOSIS — E876 Hypokalemia: Secondary | ICD-10-CM | POA: Diagnosis not present

## 2017-03-17 DIAGNOSIS — E860 Dehydration: Secondary | ICD-10-CM | POA: Insufficient documentation

## 2017-03-17 DIAGNOSIS — Z79899 Other long term (current) drug therapy: Secondary | ICD-10-CM | POA: Insufficient documentation

## 2017-03-17 LAB — ETHANOL: Alcohol, Ethyl (B): <10 mg/dL (ref <10)

## 2017-03-17 LAB — BASIC METABOLIC PANEL
ANION GAP: 10 (ref 5–15)
BUN: 7 mg/dL (ref 6–20)
CO2: 20 mmol/L — AB (ref 22–32)
Calcium: 8.3 mg/dL — ABNORMAL LOW (ref 8.9–10.3)
Chloride: 98 mmol/L — ABNORMAL LOW (ref 101–111)
Creatinine, Ser: 0.84 mg/dL (ref 0.61–1.24)
GFR calc Af Amer: >60 mL/min (ref >60)
GFR calc non Af Amer: >60 mL/min (ref >60)
GLUCOSE: 104 mg/dL — AB (ref 65–99)
POTASSIUM: 3 mmol/L — AB (ref 3.5–5.1)
Sodium: 128 mmol/L — ABNORMAL LOW (ref 135–145)

## 2017-03-17 LAB — CBC WITH DIFFERENTIAL/PLATELET
BASOS PCT: 1 %
Basophils Absolute: 0.1 10*3/uL (ref 0–0.1)
EOS ABS: 0.1 10*3/uL (ref 0–0.7)
Eosinophils Relative: 1 %
HEMATOCRIT: 39 % — AB (ref 40.0–52.0)
Hemoglobin: 13.9 g/dL (ref 13.0–18.0)
Lymphocytes Relative: 10 %
Lymphs Abs: 1 10*3/uL (ref 1.0–3.6)
MCH: 34.2 pg — ABNORMAL HIGH (ref 26.0–34.0)
MCHC: 35.7 g/dL (ref 32.0–36.0)
MCV: 95.9 fL (ref 80.0–100.0)
MONOS PCT: 7 %
Monocytes Absolute: 0.7 10*3/uL (ref 0.2–1.0)
NEUTROS ABS: 8.4 10*3/uL — AB (ref 1.4–6.5)
Neutrophils Relative %: 83 %
Platelets: 228 10*3/uL (ref 150–440)
RBC: 4.07 MIL/uL — ABNORMAL LOW (ref 4.40–5.90)
RDW: 14.8 % — AB (ref 11.5–14.5)
WBC: 10.1 10*3/uL (ref 3.8–10.6)

## 2017-03-17 LAB — HEPATIC FUNCTION PANEL
ALT: 12 U/L — AB (ref 17–63)
AST: 24 U/L (ref 15–41)
Albumin: 2.3 g/dL — ABNORMAL LOW (ref 3.5–5.0)
Alkaline Phosphatase: 105 U/L (ref 38–126)
BILIRUBIN DIRECT: 0.3 mg/dL (ref 0.1–0.5)
BILIRUBIN TOTAL: 1.5 mg/dL — AB (ref 0.3–1.2)
Indirect Bilirubin: 1.2 mg/dL — ABNORMAL HIGH (ref 0.3–0.9)
Total Protein: 6.3 g/dL — ABNORMAL LOW (ref 6.5–8.1)

## 2017-03-17 LAB — TROPONIN I

## 2017-03-17 LAB — AMMONIA: Ammonia: 15 umol/L (ref 9–35)

## 2017-03-17 LAB — LIPASE, BLOOD: Lipase: 27 U/L (ref 11–51)

## 2017-03-17 MED ORDER — LEVETIRACETAM IN NACL 1000 MG/100ML IV SOLN: 1000.0000 mg | Freq: Once | INTRAVENOUS | Status: DC

## 2017-03-17 MED ORDER — SODIUM CHLORIDE 0.9 % IV BOLUS (SEPSIS)
1000.0000 mL | Freq: Once | INTRAVENOUS | Status: AC
  Administered 2017-03-17: 1000 mL via INTRAVENOUS

## 2017-03-17 MED ORDER — SODIUM CHLORIDE 0.9 % IV SOLN
Freq: Once | INTRAVENOUS | Status: AC
  Administered 2017-03-17: 22:00:00 via INTRAVENOUS
  Filled 2017-03-17: qty 10

## 2017-03-17 MED ORDER — POTASSIUM CHLORIDE CRYS ER 20 MEQ PO TBCR
40.0000 meq | EXTENDED_RELEASE_TABLET | Freq: Once | ORAL | Status: AC
  Administered 2017-03-17: 40 meq via ORAL
  Filled 2017-03-17: qty 2

## 2017-03-17 NOTE — ED Provider Notes (Signed)
[facility] Emergency Department Provider Note  ____________________________________________  Time seen: Approximately 8:52 PM  I have reviewed the triage vital signs and the nursing notes.   HISTORY  Chief Complaint Chest Pain     HPI Marco [name] is a 62 y.o. male comes EDU with confusion and reporting chest pain.chest pain started at 6:30 PM, constant since then, no radiation, no acute shortness of breath, no vomiting dizziness sweating or passing out. Feels like tightness. No aggravating or alleviating factors. Not exertional.  Family also report that patient has been confused today, not recognizing his family at home. He has a history of liver cirrhosis. Review of chronic medical record shows he was just in the hospital where he was treated for hyponatremia. He's not had his Keppra for seizure disorder in 4 days     Past Medical History:  Diagnosis Date  . Anxiety   . Arthritis   . Depression   . Heartburn   . Seizures [facility])      Patient Active Problem List   Diagnosis Date Noted  . Hyponatremia 03/12/2017  . Acute encephalopathy 03/04/2017     Past Surgical History:  Procedure Laterality Date  . KNEE ARTHROSCOPY  1975     Prior to Admission medications   Medication Sig Start Date End Date Taking? Authorizing Provider  albuterol (PROVENTIL HFA;VENTOLIN HFA) 108 (90 Base) MCG/ACT inhaler Inhale 2 puffs into the lungs every 6 (six) hours as needed for wheezing or shortness of breath. 03/14/17   Enid Baas, MD  FLUoxetine (PROZAC) 20 MG capsule Take 20 mg by mouth daily as needed. 05/15/16   [provider]  levETIRAcetam (KEPPRA) 750 MG tablet Take 750 mg by mouth 2 (two) times daily.  01/20/15   [provider]  loperamide (IMODIUM A-D) 2 MG tablet Take 1 tablet (2 mg total) by mouth 4 (four) times daily as needed for diarrhea or loose stools. 03/04/17   Rockne Menghini, MD  mometasone-formoterol [facility])  200-5 MCG/ACT AERO Inhale 2 puffs into the lungs 2 (two) times daily. 03/14/17   Enid Baas, MD  ondansetron (ZOFRAN ODT) 4 MG disintegrating tablet Take 1 tablet (4 mg total) by mouth every 8 (eight) hours as needed for nausea or vomiting. 03/04/17   Rockne Menghini, MD  pantoprazole (PROTONIX) 40 MG tablet Take 1 tablet (40 mg total) by mouth daily. 03/15/17   Enid Baas, MD     Allergies Hydrocodone-acetaminophen   Family History  Problem Relation Age of Onset  . Diabetes Mellitus II Mother   . Breast cancer Mother   . Kidney disease Neg Hx   . Prostate cancer Neg Hx     Social History Social History   Tobacco Use  . Smoking status: Current Every Day Smoker    Packs/day: 1.00  . Smokeless tobacco: Never Used  Substance Use Topics  . Alcohol use: Yes    Alcohol/week: 2.4 oz    Types: 4 Cans of beer per week  . Drug use: No    Review of Systems  Constitutional:   No fever or chills.  ENT:   No sore throat. No rhinorrhea. Cardiovascular:   positive as above chest pain withoutsyncope. Respiratory:   chronic dyspnea, no acute shortness of breath or cough Gastrointestinal:   Negative for abdominal pain, vomiting and diarrhea.  Musculoskeletal:   Negative for focal pain or swelling All other systems reviewed and are negative except as documented above in ROS and HPI.  ____________________________________________   PHYSICAL  EXAM:  VITAL SIGNS: ED Triage Vitals  Enc Vitals Group     BP 03/17/17 2028 (!) 147/80     Pulse Rate 03/17/17 2028 100     Resp 03/17/17 2028 20     Temp 03/17/17 2028 98.2 F (36.8 C)     Temp Source 03/17/17 2028 Oral     SpO2 03/17/17 2019 99 %     Weight 03/17/17 2030 140 lb (63.5 kg)     Height 03/17/17 2030 5\' 5"  (1.651 m)     Head Circumference --      Peak Flow --      Pain Score 03/17/17 2030 5     Pain Loc --      Pain Edu? --      Excl. in GC? --     Vital signs reviewed, nursing assessments  reviewed.   Constitutional:   Alert and oriented to person and place. not in distress Eyes:   Positive scleral icterus.  EOMI. No nystagmus. No conjunctival pallor. PERRL. ENT   Head:   Normocephalic and atraumatic.   Nose:   No congestion/rhinnorhea.    Mouth/Throat:   dry mucous membranes, no pharyngeal erythema. No peritonsillar mass.    Neck:   No meningismus. Full ROM. Hematological/Lymphatic/Immunilogical:   No cervical lymphadenopathy. Cardiovascular:   RRR. Symmetric bilateral radial and DP pulses.  No murmurs.  Respiratory:   Normal respiratory effort without tachypnea/retractions. Breath sounds are clear and equal bilaterally. No wheezes/rales/rhonchi. Gastrointestinal:   Soft and nontender. Non distended. There is no CVA tenderness.  No rebound, rigidity, or guarding. Genitourinary:   deferred Musculoskeletal:   Normal range of motion in all extremities. No joint effusions.  No lower extremity tenderness.  No edema. Neurologic:   normal speech, limited language range, occasionally inappropriate responses to questions.  Motor grossly intact. .  Skin:    Skin is warm, dry and intact. No rash noted.  No petechiae, purpura, or bullae.  ____________________________________________    LABS (pertinent positives/negatives) (all labs ordered are listed, but only abnormal results are displayed) Labs Reviewed  BASIC METABOLIC PANEL - Abnormal; Notable for the following components:      Result Value   Sodium 128 (*)    Potassium 3.0 (*)    Chloride 98 (*)    CO2 20 (*)    Glucose, Bld 104 (*)    Calcium 8.3 (*)    All other components within normal limits  HEPATIC FUNCTION PANEL - Abnormal; Notable for the following components:   Total Protein 6.3 (*)    Albumin 2.3 (*)    ALT 12 (*)    Total Bilirubin 1.5 (*)    Indirect Bilirubin 1.2 (*)    All other components within normal limits  CBC WITH DIFFERENTIAL/PLATELET - Abnormal; Notable for the following  components:   RBC 4.07 (*)    HCT 39.0 (*)    MCH 34.2 (*)    RDW 14.8 (*)    Neutro Abs 8.4 (*)    All other components within normal limits  LIPASE, BLOOD  ETHANOL  AMMONIA  TROPONIN I  LEVETIRACETAM LEVEL   ____________________________________________   EKG  interpreted by me Sinus rhythm rate of 99, left axis, normal intervals. Normal QRS ST segments and T waves.  ____________________________________________    RADIOLOGY  Ct Head Wo Contrast  Result Date: 03/17/2017 CLINICAL DATA:  Chest pain starting at 1830 hours with dyspnea. Altered level of consciousness. Patient could not recognize family members. EXAM:  CT HEAD WITHOUT CONTRAST TECHNIQUE: Contiguous axial images were obtained from the base of the skull through the vertex without intravenous contrast. COMPARISON:  03/04/2017 FINDINGS: Brain: Stable superficial and central atrophy with chronic appearing small vessel ischemic disease. No acute intracranial hemorrhage midline shift or edema. No intra-axial mass nor extra-axial fluid collections. Midline fourth ventricle and basal cisterns. Vascular: No hyperdense vessel sign. Skull: No acute skull fracture or suspicious osseous lesions. Sinuses/Orbits: Stable findings of polypoid mucosal thickening involving the medial right maxillary sinus. Partial opacification of left mastoid air cells also chronic. Other: None IMPRESSION: 1. Stable involutional changes of the brain with small vessel ischemic disease. No acute intracranial abnormality. 2. Stable polypoid opacification medial aspect right maxillary sinus potentially represent small polyp or mucous retention cyst. Chronic left mastoid effusion. Electronically Signed   By: Tollie Ethavid  Kwon M.D.   On: 03/17/2017 21:38   Dg Chest Portable 1 View  Result Date: 03/17/2017 CLINICAL DATA:  Chest pain starting at 1830 hours. EXAM: PORTABLE CHEST 1 VIEW COMPARISON:  03/04/2017 CXR FINDINGS: Portable AP semi upright view. Heart size is mildly  enlarged but this may be due to the portable technique and slightly low lung volumes. No overt pulmonary edema, pneumonic consolidation or effusion. There is bibasilar as well as right upper lobe atelectasis. No acute nor suspicious osseous abnormalities. IMPRESSION: Bibasilar and right upper lobe atelectasis. Borderline cardiomegaly. Slightly low lung volumes. Electronically Signed   By: Tollie Ethavid  Kwon M.D.   On: 03/17/2017 21:33    ____________________________________________   PROCEDURES Procedures  ____________________________________________  DIFFERENTIAL DIAGNOSIS   hepatic encephalopathy, delirium, intoxication, intracranial hemorrhage, metabolic derangement, GERD, and non-STEMI  CLINICAL IMPRESSION / ASSESSMENT AND PLAN / ED COURSE  Pertinent labs & imaging results that were available during my care of the patient were reviewed by me and considered in my medical decision making (see chart for details).   patient not in distress, unremarkable vital signs, no evidence of sepsis at this time. Presents with confusion and reported chest pain. Start with labs, chest x-ray. CT head. I will give him an IV Keppra load to protect from seizures since he has been off his medicine.  Clinical Course as of Mar 17 2237  Wynelle LinkSun Mar 17, 2017  2145 Ct head negative for ich or stroke. Cxr nad, no pna or ptx.   [PS]    Clinical Course User Index [PS] Sharman CheekStafford, Jupiter Kabir, MD    ----------------------------------------- 10:36 PM on 03/17/2017 -----------------------------------------  Wife at bedside reports that the patient is been confused for the past couple of weeks. Denies any issues with chest pain at home. she does agree that the patient has not been eating or drinking for the past several days although she does report that he takes his meds including his Keppra. This matches with dehydration. 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  Otherwise I think that he has chronic hyponatremia and hypokalemia, would not benefit from hospitalization, suitable for discharge home with follow-up with primary care and neurology. Vital signs remained stable, heart rate improved to 90 with IV fluids.oral potassium replacement. I doubt stroke, and meningitis, encephalitis. No evidence of sepsis at this time. I don't think that the likely abnormalities are affecting his mentation. ____________________________________________   FINAL CLINICAL IMPRESSION(S) / ED DIAGNOSES    Final diagnoses:  Dehydration  Confusion  Hyponatremia  Hypokalemia     ED Discharge Orders    None  Portions of this note were generated with dragon dictation software. Dictation errors may occur despite best attempts at proofreading.    Sharman Cheek, MD 03/17/17 2239

## 2017-03-17 NOTE — ED Notes (Signed)
Pt had BM after coughing in bed. Having liquid diarrhea at this time. This RN and pt's family helped clean pt up.

## 2017-03-17 NOTE — ED Notes (Signed)
Graham crackers and peanut butter given to pt, along with coke. Pt in NAD at this time. Warm blankets also given.

## 2017-03-17 NOTE — ED Triage Notes (Signed)
Patient presents to Emergency Department via [facility] with complaints of CP that started 1830, center chest without radiation and SOB - pt reports more so SOB than usual.  Pt denies N/V/Dizziness/sweating. Per EMS family reported that pt didn't know them.    Pt was just DC'd from ED yesterday. Hx of liver cirrhosis.

## 2017-03-18 ENCOUNTER — Telehealth: Payer: Self-pay

## 2017-03-18 NOTE — Telephone Encounter (Signed)
Spoke with wife,  Bertram Millardatricia Straughan. She states they have no pharmacy insurance and cant pay for patients inhalers. RNCM referred her and patient to medication management clinic. Address given and details of how to use pharmacy. Patient has a PCP. She verbalized understanding. Case closed.

## 2017-03-20 LAB — LEVETIRACETAM LEVEL: Levetiracetam Lvl: NOT DETECTED ug/mL (ref 10.0–40.0)

## 2017-03-21 ENCOUNTER — Other Ambulatory Visit: Payer: Self-pay

## 2017-03-21 ENCOUNTER — Encounter: Payer: Self-pay | Admitting: Emergency Medicine

## 2017-03-21 ENCOUNTER — Emergency Department: Payer: Medicare Other

## 2017-03-21 ENCOUNTER — Inpatient Hospital Stay
Admission: EM | Admit: 2017-03-21 | Discharge: 2017-03-24 | DRG: 871 | Disposition: A | Discharge status: Home / Self Care | Payer: Medicare Other | Attending: Internal Medicine | Admitting: Internal Medicine

## 2017-03-21 DIAGNOSIS — J181 Lobar pneumonia, unspecified organism: Secondary | ICD-10-CM | POA: Diagnosis not present

## 2017-03-21 DIAGNOSIS — Z6827 Body mass index (BMI) 27.0-27.9, adult: Secondary | ICD-10-CM | POA: Diagnosis not present

## 2017-03-21 DIAGNOSIS — E119 Type 2 diabetes mellitus without complications: Secondary | ICD-10-CM | POA: Diagnosis present

## 2017-03-21 DIAGNOSIS — Y95 Nosocomial condition: Secondary | ICD-10-CM | POA: Diagnosis present

## 2017-03-21 DIAGNOSIS — E876 Hypokalemia: Secondary | ICD-10-CM | POA: Diagnosis present

## 2017-03-21 DIAGNOSIS — J441 Chronic obstructive pulmonary disease with (acute) exacerbation: Secondary | ICD-10-CM | POA: Diagnosis present

## 2017-03-21 DIAGNOSIS — Z885 Allergy status to narcotic agent status: Secondary | ICD-10-CM | POA: Diagnosis not present

## 2017-03-21 DIAGNOSIS — I509 Heart failure, unspecified: Secondary | ICD-10-CM

## 2017-03-21 DIAGNOSIS — E538 Deficiency of other specified B group vitamins: Secondary | ICD-10-CM | POA: Diagnosis present

## 2017-03-21 DIAGNOSIS — Z79899 Other long term (current) drug therapy: Secondary | ICD-10-CM | POA: Diagnosis not present

## 2017-03-21 DIAGNOSIS — K7030 Alcoholic cirrhosis of liver without ascites: Secondary | ICD-10-CM | POA: Diagnosis present

## 2017-03-21 DIAGNOSIS — R339 Retention of urine, unspecified: Secondary | ICD-10-CM | POA: Diagnosis present

## 2017-03-21 DIAGNOSIS — R112 Nausea with vomiting, unspecified: Secondary | ICD-10-CM | POA: Diagnosis not present

## 2017-03-21 DIAGNOSIS — E440 Moderate protein-calorie malnutrition: Secondary | ICD-10-CM | POA: Diagnosis present

## 2017-03-21 DIAGNOSIS — A419 Sepsis, unspecified organism: Principal | ICD-10-CM | POA: Diagnosis present

## 2017-03-21 DIAGNOSIS — F17210 Nicotine dependence, cigarettes, uncomplicated: Secondary | ICD-10-CM | POA: Diagnosis present

## 2017-03-21 DIAGNOSIS — F10239 Alcohol dependence with withdrawal, unspecified: Secondary | ICD-10-CM | POA: Diagnosis present

## 2017-03-21 DIAGNOSIS — G40909 Epilepsy, unspecified, not intractable, without status epilepticus: Secondary | ICD-10-CM | POA: Diagnosis present

## 2017-03-21 DIAGNOSIS — J189 Pneumonia, unspecified organism: Secondary | ICD-10-CM | POA: Diagnosis not present

## 2017-03-21 DIAGNOSIS — F0390 Unspecified dementia without behavioral disturbance: Secondary | ICD-10-CM | POA: Diagnosis present

## 2017-03-21 DIAGNOSIS — F10230 Alcohol dependence with withdrawal, uncomplicated: Secondary | ICD-10-CM | POA: Diagnosis not present

## 2017-03-21 DIAGNOSIS — Z23 Encounter for immunization: Secondary | ICD-10-CM | POA: Diagnosis present

## 2017-03-21 DIAGNOSIS — Z66 Do not resuscitate: Secondary | ICD-10-CM | POA: Diagnosis present

## 2017-03-21 DIAGNOSIS — R197 Diarrhea, unspecified: Secondary | ICD-10-CM | POA: Diagnosis present

## 2017-03-21 DIAGNOSIS — J440 Chronic obstructive pulmonary disease with acute lower respiratory infection: Secondary | ICD-10-CM | POA: Diagnosis present

## 2017-03-21 DIAGNOSIS — E871 Hypo-osmolality and hyponatremia: Secondary | ICD-10-CM | POA: Diagnosis present

## 2017-03-21 LAB — COMPREHENSIVE METABOLIC PANEL
ALT: 14 U/L — AB (ref 17–63)
AST: 40 U/L (ref 15–41)
Albumin: 2.6 g/dL — ABNORMAL LOW (ref 3.5–5.0)
Alkaline Phosphatase: 114 U/L (ref 38–126)
Anion gap: 14 (ref 5–15)
BILIRUBIN TOTAL: 1.8 mg/dL — AB (ref 0.3–1.2)
BUN: 10 mg/dL (ref 6–20)
CO2: 15 mmol/L — ABNORMAL LOW (ref 22–32)
CREATININE: 1.08 mg/dL (ref 0.61–1.24)
Calcium: 8.6 mg/dL — ABNORMAL LOW (ref 8.9–10.3)
Chloride: 99 mmol/L — ABNORMAL LOW (ref 101–111)
GFR calc Af Amer: >60 mL/min (ref >60)
GFR calc non Af Amer: >60 mL/min (ref >60)
Glucose, Bld: 117 mg/dL — ABNORMAL HIGH (ref 65–99)
POTASSIUM: 3.3 mmol/L — AB (ref 3.5–5.1)
Sodium: 128 mmol/L — ABNORMAL LOW (ref 135–145)
TOTAL PROTEIN: 6.9 g/dL (ref 6.5–8.1)

## 2017-03-21 LAB — INFLUENZA PANEL BY PCR (TYPE A & B)
INFLAPCR: NEGATIVE
INFLBPCR: NEGATIVE

## 2017-03-21 LAB — URINE DRUG SCREEN, QUALITATIVE (ARMC ONLY)
AMPHETAMINES, UR SCREEN: NOT DETECTED
Barbiturates, Ur Screen: NOT DETECTED
Benzodiazepine, Ur Scrn: NOT DETECTED
CANNABINOID 50 NG, UR ~~LOC~~: NOT DETECTED
Cocaine Metabolite,Ur ~~LOC~~: NOT DETECTED
MDMA (ECSTASY) UR SCREEN: NOT DETECTED
Methadone Scn, Ur: NOT DETECTED
OPIATE, UR SCREEN: NOT DETECTED
PHENCYCLIDINE (PCP) UR S: NOT DETECTED
Tricyclic, Ur Screen: NOT DETECTED

## 2017-03-21 LAB — URINALYSIS, COMPLETE (UACMP) WITH MICROSCOPIC
Bacteria, UA: NONE SEEN
Bilirubin Urine: NEGATIVE
GLUCOSE, UA: NEGATIVE mg/dL
Ketones, ur: NEGATIVE mg/dL
Leukocytes, UA: NEGATIVE
Nitrite: NEGATIVE
PH: 5 (ref 5.0–8.0)
PROTEIN: NEGATIVE mg/dL
Specific Gravity, Urine: 1.004 — ABNORMAL LOW (ref 1.005–1.030)
Squamous Epithelial / LPF: NONE SEEN

## 2017-03-21 LAB — CBC WITH DIFFERENTIAL/PLATELET
BASOS PCT: 0 %
Basophils Absolute: 0 10*3/uL (ref 0–0.1)
EOS ABS: 0 10*3/uL (ref 0–0.7)
EOS PCT: 0 %
HCT: 45.1 % (ref 40.0–52.0)
Hemoglobin: 15.1 g/dL (ref 13.0–18.0)
Lymphocytes Relative: 4 %
Lymphs Abs: 0.7 10*3/uL — ABNORMAL LOW (ref 1.0–3.6)
MCH: 33.2 pg (ref 26.0–34.0)
MCHC: 33.6 g/dL (ref 32.0–36.0)
MCV: 98.9 fL (ref 80.0–100.0)
MONO ABS: 0.7 10*3/uL (ref 0.2–1.0)
MONOS PCT: 4 %
Neutro Abs: 14 10*3/uL — ABNORMAL HIGH (ref 1.4–6.5)
Neutrophils Relative %: 92 %
Platelets: 259 10*3/uL (ref 150–440)
RBC: 4.56 MIL/uL (ref 4.40–5.90)
RDW: 14.9 % — AB (ref 11.5–14.5)
WBC: 15.4 10*3/uL — ABNORMAL HIGH (ref 3.8–10.6)

## 2017-03-21 LAB — MRSA PCR SCREENING: MRSA by PCR: NEGATIVE

## 2017-03-21 LAB — LACTIC ACID, PLASMA
LACTIC ACID, VENOUS: 3.6 mmol/L — AB (ref 0.5–1.9)
Lactic Acid, Venous: 3.5 mmol/L (ref 0.5–1.9)

## 2017-03-21 LAB — PROTIME-INR
INR: 1.31
PROTHROMBIN TIME: 16.2 s — AB (ref 11.4–15.2)

## 2017-03-21 LAB — TSH: TSH: 1.555 u[IU]/mL (ref 0.350–4.500)

## 2017-03-21 LAB — ETHANOL

## 2017-03-21 LAB — TROPONIN I: Troponin I: <0.03 ng/mL (ref <0.03)

## 2017-03-21 MED ORDER — ADULT MULTIVITAMIN W/MINERALS CH
1.0000 | ORAL_TABLET | Freq: Every day | ORAL | Status: DC
  Administered 2017-03-21 – 2017-03-24 (×4): 1 via ORAL
  Filled 2017-03-21 (×4): qty 1

## 2017-03-21 MED ORDER — VITAMIN B-1 100 MG PO TABS
100.0000 mg | ORAL_TABLET | Freq: Every day | ORAL | Status: DC
  Administered 2017-03-21 – 2017-03-23 (×3): 100 mg via ORAL
  Filled 2017-03-21 (×4): qty 1

## 2017-03-21 MED ORDER — LORAZEPAM 2 MG PO TABS
0.0000 mg | ORAL_TABLET | Freq: Four times a day (QID) | ORAL | Status: AC
  Administered 2017-03-22: 4 mg via ORAL
  Administered 2017-03-23: 2 mg via ORAL
  Filled 2017-03-21: qty 1
  Filled 2017-03-21: qty 2
  Filled 2017-03-21: qty 1

## 2017-03-21 MED ORDER — SODIUM CHLORIDE 0.9 % IV SOLN
2.0000 g | Freq: Once | INTRAVENOUS | Status: AC
  Administered 2017-03-21: 2 g via INTRAVENOUS
  Filled 2017-03-21: qty 2

## 2017-03-21 MED ORDER — OXYCODONE HCL 5 MG PO TABS
5.0000 mg | ORAL_TABLET | ORAL | Status: DC | PRN
  Administered 2017-03-22: 5 mg via ORAL
  Filled 2017-03-21: qty 1

## 2017-03-21 MED ORDER — POTASSIUM CHLORIDE CRYS ER 20 MEQ PO TBCR
40.0000 meq | EXTENDED_RELEASE_TABLET | Freq: Once | ORAL | Status: AC
  Administered 2017-03-21: 40 meq via ORAL
  Filled 2017-03-21: qty 2

## 2017-03-21 MED ORDER — SODIUM CHLORIDE 0.9 % IV SOLN
INTRAVENOUS | Status: DC
  Administered 2017-03-21 – 2017-03-22 (×3): via INTRAVENOUS

## 2017-03-21 MED ORDER — SENNOSIDES-DOCUSATE SODIUM 8.6-50 MG PO TABS: 1.0000 | ORAL_TABLET | Freq: Every evening | ORAL | Status: DC | PRN

## 2017-03-21 MED ORDER — ONDANSETRON HCL 4 MG/2ML IJ SOLN
4.0000 mg | Freq: Four times a day (QID) | INTRAMUSCULAR | Status: DC | PRN
  Administered 2017-03-22 – 2017-03-24 (×3): 4 mg via INTRAVENOUS
  Filled 2017-03-21 (×3): qty 2

## 2017-03-21 MED ORDER — THIAMINE HCL 100 MG/ML IJ SOLN
100.0000 mg | Freq: Every day | INTRAMUSCULAR | Status: DC
  Administered 2017-03-23 – 2017-03-24 (×2): 100 mg via INTRAVENOUS
  Filled 2017-03-21 (×2): qty 2

## 2017-03-21 MED ORDER — INFLUENZA VAC SPLIT QUAD 0.5 ML IM SUSY
0.5000 mL | PREFILLED_SYRINGE | INTRAMUSCULAR | Status: AC
  Administered 2017-03-22: 0.5 mL via INTRAMUSCULAR
  Filled 2017-03-21: qty 0.5

## 2017-03-21 MED ORDER — ACETAMINOPHEN 650 MG RE SUPP: 650.0000 mg | Freq: Four times a day (QID) | RECTAL | Status: DC | PRN

## 2017-03-21 MED ORDER — SODIUM CHLORIDE 0.9 % IV BOLUS (SEPSIS)
1000.0000 mL | Freq: Once | INTRAVENOUS | Status: AC
Start: 2017-03-21 — End: 2017-03-21
  Administered 2017-03-21: 1000 mL via INTRAVENOUS

## 2017-03-21 MED ORDER — LEVETIRACETAM 750 MG PO TABS
750.0000 mg | ORAL_TABLET | Freq: Two times a day (BID) | ORAL | Status: DC
  Administered 2017-03-21 – 2017-03-24 (×5): 750 mg via ORAL
  Filled 2017-03-21 (×7): qty 1

## 2017-03-21 MED ORDER — LORAZEPAM 1 MG PO TABS: 1.0000 mg | ORAL_TABLET | Freq: Four times a day (QID) | ORAL | Status: AC | PRN

## 2017-03-21 MED ORDER — LORAZEPAM 2 MG PO TABS: 0.0000 mg | ORAL_TABLET | Freq: Two times a day (BID) | ORAL | Status: DC

## 2017-03-21 MED ORDER — ENOXAPARIN SODIUM 40 MG/0.4ML ~~LOC~~ SOLN
40.0000 mg | SUBCUTANEOUS | Status: DC
  Administered 2017-03-21 – 2017-03-23 (×3): 40 mg via SUBCUTANEOUS
  Filled 2017-03-21 (×3): qty 0.4

## 2017-03-21 MED ORDER — ACETAMINOPHEN 325 MG PO TABS: 650.0000 mg | ORAL_TABLET | Freq: Four times a day (QID) | ORAL | Status: DC | PRN

## 2017-03-21 MED ORDER — VANCOMYCIN HCL IN DEXTROSE 1-5 GM/200ML-% IV SOLN
1000.0000 mg | Freq: Once | INTRAVENOUS | Status: AC
  Administered 2017-03-21: 1000 mg via INTRAVENOUS
  Filled 2017-03-21: qty 200

## 2017-03-21 MED ORDER — ONDANSETRON HCL 4 MG PO TABS: 4.0000 mg | ORAL_TABLET | Freq: Four times a day (QID) | ORAL | Status: DC | PRN

## 2017-03-21 MED ORDER — FOLIC ACID 1 MG PO TABS
1.0000 mg | ORAL_TABLET | Freq: Every day | ORAL | Status: DC
  Administered 2017-03-21 – 2017-03-24 (×5): 1 mg via ORAL
  Filled 2017-03-21 (×4): qty 1

## 2017-03-21 MED ORDER — SODIUM CHLORIDE 0.9 % IV BOLUS (SEPSIS)
1000.0000 mL | Freq: Once | INTRAVENOUS | Status: AC
  Administered 2017-03-21: 1000 mL via INTRAVENOUS

## 2017-03-21 MED ORDER — SODIUM CHLORIDE 0.9 % IV SOLN
2.0000 g | Freq: Three times a day (TID) | INTRAVENOUS | Status: DC
  Administered 2017-03-21 – 2017-03-24 (×9): 2 g via INTRAVENOUS
  Filled 2017-03-21 (×12): qty 2

## 2017-03-21 MED ORDER — LORAZEPAM 2 MG/ML IJ SOLN: 1.0000 mg | Freq: Four times a day (QID) | INTRAMUSCULAR | Status: AC | PRN

## 2017-03-21 NOTE — Progress Notes (Signed)
Pharmacy Antibiotic Note  Marco RegisterMichael [name] is a 62 y.o. male admitted on 03/21/2017 with sepsis.  Pharmacy has been consulted for cefepime dosing.  Plan: Cefepime 2 g IV q8h  Height: 5\' 5"  (165.1 cm) Weight: 140 lb (63.5 kg) IBW/kg (Calculated) : 61.5  Temp (24hrs), Avg:97.5 F (36.4 C), Min:97.5 F (36.4 C), Max:97.5 F (36.4 C)  Recent Labs  Lab 03/17/17 2029 03/21/17 1338 03/21/17 1645  WBC 10.1 15.4*  --   CREATININE 0.84 1.08  --   LATICACIDVEN  --  3.6* 3.5*    Estimated Creatinine Clearance: 62.5 mL/min (by C-G formula based on SCr of 1.08 mg/dL).    Allergies  Allergen Reactions  . Hydrocodone-Acetaminophen Nausea Only and Nausea And Vomiting   Antimicrobials this admission: cefepime 3/21 >>   Dose adjustments this admission:  Microbiology results: 3/21 BCx: Sent  Thank you for allowing pharmacy to be a part of this patient's care.  Cindi CarbonMary M Kristi Norment, PharmD, BCPS Clinical Pharmacist 03/21/2017 5:34 PM

## 2017-03-21 NOTE — H&P (Signed)
[employer] [facility] -  at [facility]   PATIENT NAME: Marco [name]    MR#:  [id number]  DATE OF BIRTH:  July 28, 1955  DATE OF ADMISSION:  03/21/2017  PRIMARY CARE PHYSICIAN: Jerl Mina, MD   REQUESTING/REFERRING PHYSICIAN: dr Marisa Severin  CHIEF COMPLAINT:   sob HISTORY OF PRESENT ILLNESS:  Marco [name]  is a 62 y.o. male with a known history of EtOh abuse and chronic hyponatremia who was in ED twice this month once for COPD exacerbation (refused admission) and again for gastroenteritis who presents to ED today with SOB.  Patient is a very poor historian.  Apparently patient has had confusion over the past 3-4 weeks and his wife thinks that he may be having dementia.  He has outpatient follow-up with neurology.  HPI taken from the ED physician and nurse.  Patient presented today with shortness of breath.  Patient has had shortness of breath over the past 2 days.  Chest x-ray is consistent with pneumonia. No fever, nausea, vomiting or diarrhea is reported recently.  Patient was hospitalized as mentioned above for COPD in early March and then gastroenteritis. It appears that he has chronic hyponatremia He says his last drink was 3 days ago.   He has been given vancomycin and cefepime for pneumonia. PAST MEDICAL HISTORY:   Past Medical History:  Diagnosis Date  . Anxiety   . Arthritis   . Depression   . Heartburn   . Seizures (HCC)     PAST SURGICAL HISTORY:   Past Surgical History:  Procedure Laterality Date  . KNEE ARTHROSCOPY  1975    SOCIAL HISTORY:   Social History   Tobacco Use  . Smoking status: Current Every Day Smoker    Packs/day: 1.00  . Smokeless tobacco: Never Used  Substance Use Topics  . Alcohol use: Yes    Alcohol/week: 2.4 oz    Types: 4 Cans of beer per week    FAMILY HISTORY:   Family History  Problem Relation Age of Onset  . Diabetes Mellitus II Mother   . Breast cancer Mother   . Kidney disease Neg Hx   . Prostate  cancer Neg Hx     DRUG ALLERGIES:   Allergies  Allergen Reactions  . Hydrocodone-Acetaminophen Nausea Only and Nausea And Vomiting    REVIEW OF SYSTEMS:   Review of Systems  Constitutional: Positive for malaise/fatigue and weight loss. Negative for chills and fever.  HENT: Negative.  Negative for ear discharge, ear pain, hearing loss, nosebleeds and sore throat.   Eyes: Negative.  Negative for blurred vision and pain.  Respiratory: Positive for cough and shortness of breath. Negative for hemoptysis and wheezing.   Cardiovascular: Negative.  Negative for chest pain, palpitations and leg swelling.  Gastrointestinal: Negative.  Negative for abdominal pain, blood in stool, diarrhea, nausea and vomiting.  Genitourinary: Negative.  Negative for dysuria.  Musculoskeletal: Negative.  Negative for back pain.  Skin: Negative.   Neurological: Negative for dizziness, tremors, speech change, focal weakness, seizures and headaches.  Endo/Heme/Allergies: Negative.  Does not bruise/bleed easily.  Psychiatric/Behavioral: Positive for memory loss. Negative for depression, hallucinations and suicidal ideas.    MEDICATIONS AT HOME:   Prior to Admission medications   Medication Sig Start Date End Date Taking? Authorizing Provider  ondansetron (ZOFRAN ODT) 4 MG disintegrating tablet Take 1 tablet (4 mg total) by mouth every 8 (eight) hours as needed for nausea or vomiting. 03/04/17  Yes Rockne Menghini, MD  albuterol (PROVENTIL HFA;VENTOLIN HFA) 108 (  90 Base) MCG/ACT inhaler Inhale 2 puffs into the lungs every 6 (six) hours as needed for wheezing or shortness of breath. Patient not taking: Reported on 03/21/2017 03/14/17   Enid Baas, MD  levETIRAcetam (KEPPRA) 750 MG tablet Take 750 mg by mouth 2 (two) times daily.  01/20/15   [provider]  loperamide (IMODIUM A-D) 2 MG tablet Take 1 tablet (2 mg total) by mouth 4 (four) times daily as needed for diarrhea or loose stools. Patient  not taking: Reported on 03/21/2017 03/04/17   Rockne Menghini, MD  mometasone-formoterol [facility]) 200-5 MCG/ACT AERO Inhale 2 puffs into the lungs 2 (two) times daily. Patient not taking: Reported on 03/21/2017 03/14/17   Enid Baas, MD  pantoprazole (PROTONIX) 40 MG tablet Take 1 tablet (40 mg total) by mouth daily. Patient not taking: Reported on 03/21/2017 03/15/17   Enid Baas, MD      VITAL SIGNS:  Blood pressure (!) 146/89, pulse (!) 108, temperature (!) 97.5 F (36.4 C), temperature source Oral, resp. rate (!) 22, height 5\' 5"  (1.651 m), weight 63.5 kg (140 lb), SpO2 100 %.  PHYSICAL EXAMINATION:   Physical Exam  Constitutional: He is oriented to person, place, and time and well-developed, well-nourished, and in no distress. No distress.  HENT:  Head: Normocephalic.  Eyes: No scleral icterus.  Neck: Normal range of motion. Neck supple. No JVD present. No tracheal deviation present.  Cardiovascular: Normal rate, regular rhythm and normal heart sounds. Exam reveals no gallop and no friction rub.  No murmur heard. Pulmonary/Chest: Effort normal. No respiratory distress. He has wheezes. He has no rales. He exhibits no tenderness.  Abdominal: Soft. Bowel sounds are normal. He exhibits no distension and no mass. There is no tenderness. There is no rebound and no guarding.  Musculoskeletal: Normal range of motion. He exhibits no edema.  Neurological: He is alert and oriented to person, place, and time.  Skin: Skin is warm. No rash noted. No erythema.  Psychiatric: Affect and judgment normal.      LABORATORY PANEL:   CBC Recent Labs  Lab 03/21/17 1338  WBC 15.4*  HGB 15.1  HCT 45.1  PLT 259   ------------------------------------------------------------------------------------------------------------------  Chemistries  Recent Labs  Lab 03/21/17 1338  NA 128*  K 3.3*  CL 99*  CO2 15*  GLUCOSE 117*  BUN 10  CREATININE 1.08  CALCIUM 8.6*  AST 40   ALT 14*  ALKPHOS 114  BILITOT 1.8*   ------------------------------------------------------------------------------------------------------------------  Cardiac Enzymes Recent Labs  Lab 03/21/17 1338  TROPONINI <0.03   ------------------------------------------------------------------------------------------------------------------  RADIOLOGY:  Dg Chest 2 View  Result Date: 03/21/2017 CLINICAL DATA:  Not feeling well.  Shortness of breath. EXAM: CHEST - 2 VIEW COMPARISON:  03/17/2017. FINDINGS: BILATERAL airspace opacities have worsened RIGHT greater than LEFT since the prior radiograph consistent with pneumonia. No effusion or pneumothorax. No significant lobar atelectasis. Normal heart size. No bony abnormality. Suspected COPD. IMPRESSION: Worsening aeration suggesting BILATERAL pneumonia, worse on the RIGHT. Electronically Signed   By: Elsie Stain M.D.   On: 03/21/2017 14:06    EKG:  Sinus tachycardia Low voltage QRS Left anterior fascicular block Possible Anterolateral infarct , age undetermined   IMPRESSION AND PLAN:   62 y/o male with history of etoh abuse, weight loss and seizure disorder who presents to ED with SOB.  1.sepsis: Patient presents with tachypnea, leukocytosis, tachycardia and elevated lactic acid. Sepsis is due to HCAP. The lactic acid level continue IV fluids.  2.HCAP: Continue cefepime MRSA  PCR ordered and if positive can restart vancomycin.  3.  Hyponatremia: This appears to be chronic in nature and related to EtOH abuse BMP for a.m.  4.  EtOH abuse: CIWA protocol initiated  5.  Confusion: Apparently patient has a follow-up with neurology in a couple weeks. CT head March 17 shows Stable involutional changes of the brain with small vessel ischemic disease. No acute intracranial abnormality. I will order B12, TSH, RPR, keppra level, UDM, UA and ETOh level  Patient can follow-up with neurology outpatient as already scheduled. Likely will need  EEG and MRI outpatient.  6. Tobacco dependence: Patient is encouraged to quit smoking. Counseling was provided for 4 minutes.  7.  Hypokalemia: Replete and recheck in a.m.  8.  History of seizures: Continue Keppra      All the records are reviewed and case discussed with ED provider. Management plans discussed with the patient and he is in agreement  CODE STATUS: FULL  TOTAL TIME TAKING CARE OF THIS PATIENT: 42 minutes.    Ivy Puryear M.D on 03/21/2017 at 5:17 PM  Between 7am to 6pm - Pager - [phone]  After 6pm go to [email or web address] - Social research officer, governmentpassword [email or web address] [facility]  [facility]  Office  [phone]  CC: Primary care physician; Jerl MinaHedrick, James, MD

## 2017-03-21 NOTE — ED Provider Notes (Signed)
[facility] Emergency Department Provider Note ____________________________________________   None    (approximate)  I have reviewed the triage vital signs and the nursing notes.   HISTORY  Chief Complaint Shortness of Breath  Level 5 caveat: History of present illness significantly limited due to dementia  HPI Marco [name] is a 62 y.o. male with past medical history as noted below as well as a history of dementia who presents for shortness of breath over the last week, but acutely worsened today.  The patient has had increased confusion over the last several weeks which is currently being worked up, however he had no acute change in his mental status in the last few days.  He has been coughing and was somewhat short of breath but then had a markedly increased work of breathing today at home.   Past Medical History:  Diagnosis Date  . Anxiety   . Arthritis   . Depression   . Heartburn   . Seizures [facility])     Patient Active Problem List   Diagnosis Date Noted  . Hyponatremia 03/12/2017  . Acute encephalopathy 03/04/2017    Past Surgical History:  Procedure Laterality Date  . KNEE ARTHROSCOPY  1975    Prior to Admission medications   Medication Sig Start Date End Date Taking? Authorizing Provider  ondansetron (ZOFRAN ODT) 4 MG disintegrating tablet Take 1 tablet (4 mg total) by mouth every 8 (eight) hours as needed for nausea or vomiting. 03/04/17  Yes Rockne Menghini, MD  albuterol (PROVENTIL HFA;VENTOLIN HFA) 108 (90 Base) MCG/ACT inhaler Inhale 2 puffs into the lungs every 6 (six) hours as needed for wheezing or shortness of breath. Patient not taking: Reported on 03/21/2017 03/14/17   Enid Baas, MD  levETIRAcetam (KEPPRA) 750 MG tablet Take 750 mg by mouth 2 (two) times daily.  01/20/15   [provider]  loperamide (IMODIUM A-D) 2 MG tablet Take 1 tablet (2 mg total) by mouth 4 (four) times daily as needed for diarrhea or  loose stools. Patient not taking: Reported on 03/21/2017 03/04/17   Rockne Menghini, MD  mometasone-formoterol [facility]) 200-5 MCG/ACT AERO Inhale 2 puffs into the lungs 2 (two) times daily. Patient not taking: Reported on 03/21/2017 03/14/17   Enid Baas, MD  pantoprazole (PROTONIX) 40 MG tablet Take 1 tablet (40 mg total) by mouth daily. Patient not taking: Reported on 03/21/2017 03/15/17   Enid Baas, MD    Allergies Hydrocodone-acetaminophen  Family History  Problem Relation Age of Onset  . Diabetes Mellitus II Mother   . Breast cancer Mother   . Kidney disease Neg Hx   . Prostate cancer Neg Hx     Social History Social History   Tobacco Use  . Smoking status: Current Every Day Smoker    Packs/day: 1.00  . Smokeless tobacco: Never Used  Substance Use Topics  . Alcohol use: Yes    Alcohol/week: 2.4 oz    Types: 4 Cans of beer per week  . Drug use: No    Review of Systems Level 5 caveat: Unable to obtain review of systems due to dementia .   ____________________________________________   PHYSICAL EXAM:  VITAL SIGNS: ED Triage Vitals  Enc Vitals Group     BP 03/21/17 1322 117/60     Pulse Rate 03/21/17 1322 (!) 119     Resp 03/21/17 1322 (!) 28     Temp 03/21/17 1322 (!) 97.5 F (36.4 C)     Temp Source 03/21/17 1322  Oral     SpO2 03/21/17 1322 100 %     Weight 03/21/17 1322 140 lb (63.5 kg)     Height 03/21/17 1322 5\' 5"  (1.651 m)     Head Circumference --      Peak Flow --      Pain Score 03/21/17 1321 0     Pain Loc --      Pain Edu? --      Excl. in GC? --     Constitutional: Alert, oriented to name.  No acute distress. Eyes: Conjunctivae are normal.  Head: Atraumatic. Nose: No congestion/rhinnorhea. Mouth/Throat: Mucous membranes are dry.   Neck: Normal range of motion.  Cardiovascular: Borderline tachycardic, regular rhythm. Grossly normal heart sounds.  Good peripheral circulation. Respiratory: Normal respiratory effort.  No  retractions.  Coarse breath sounds bilaterally but no significant rales or wheezes. Gastrointestinal: Soft and nontender. No distention.  Genitourinary: No CVA tenderness. Musculoskeletal: No lower extremity edema.  Extremities warm and well perfused.  Neurologic: Motor intact in all extremities. Skin:  Skin is warm and dry. No rash noted. Psychiatric: Unable to assess.  Calm and cooperative.  ____________________________________________   LABS (all labs ordered are listed, but only abnormal results are displayed)  Labs Reviewed  COMPREHENSIVE METABOLIC PANEL - Abnormal; Notable for the following components:      Result Value   Sodium 128 (*)    Potassium 3.3 (*)    Chloride 99 (*)    CO2 15 (*)    Glucose, Bld 117 (*)    Calcium 8.6 (*)    Albumin 2.6 (*)    ALT 14 (*)    Total Bilirubin 1.8 (*)    All other components within normal limits  LACTIC ACID, PLASMA - Abnormal; Notable for the following components:   Lactic Acid, Venous 3.6 (*)    All other components within normal limits  CBC WITH DIFFERENTIAL/PLATELET - Abnormal; Notable for the following components:   WBC 15.4 (*)    RDW 14.9 (*)    Neutro Abs 14.0 (*)    Lymphs Abs 0.7 (*)    All other components within normal limits  PROTIME-INR - Abnormal; Notable for the following components:   Prothrombin Time 16.2 (*)    All other components within normal limits  CULTURE, BLOOD (ROUTINE X 2)  CULTURE, BLOOD (ROUTINE X 2)  TROPONIN I  INFLUENZA PANEL BY PCR (TYPE A & B)  LACTIC ACID, PLASMA  URINALYSIS, COMPLETE (UACMP) WITH MICROSCOPIC   ____________________________________________  EKG  ED ECG REPORT I, Dionne BucySebastian Lyncoln Ledgerwood, the attending physician, personally viewed and interpreted this ECG.  Date: 03/21/2017 EKG Time: 1327 Rate: 119 Rhythm: Sinus tachycardia QRS Axis: normal Intervals: LAFB ST/T Wave abnormalities: Nonspecific T wave flattening anteriorly Narrative Interpretation: no evidence of acute  ischemia  ____________________________________________  RADIOLOGY  CXR: Bilateral infiltrates worse on right ____________________________________________   PROCEDURES  Procedure(s) performed: No  Procedures  Critical Care performed: Yes  CRITICAL CARE Performed by: Dionne BucySebastian Stoney Karczewski   Total critical care time: 35 minutes  Critical care time was exclusive of separately billable procedures and treating other patients.  Critical care was necessary to treat or prevent imminent or life-threatening deterioration.  Critical care was time spent personally by me on the following activities: development of treatment plan with patient and/or surrogate as well as nursing, discussions with consultants, evaluation of patient's response to treatment, examination of patient, obtaining history from patient or surrogate, ordering and performing treatments and interventions, ordering and review  of laboratory studies, ordering and review of radiographic studies, pulse oximetry and re-evaluation of patient's condition. ____________________________________________   INITIAL IMPRESSION / ASSESSMENT AND PLAN / ED COURSE  Pertinent labs & imaging results that were available during my care of the patient were reviewed by me and considered in my medical decision making (see chart for details).  62 year old male with past medical history as noted above and apparent history of dementia, as well as recent increased confusion for which she is currently being worked up, presents with worsening shortness of breath and cough over the last week, but acutely worsened over the last day.  I reviewed the past medical records in Epic; the patient was admitted earlier this month for weakness and vomiting, and was treated for hyponatremia.  He was seen in the ED for increased confusion and chest pain last week.  His workup was negative, vital signs remained stable, and the patient was discharged home.  On exam here,  the patient has borderline tachycardia, but no hypoxia and other vital signs are normal.  He has no acute respiratory distress.  Initial labs and chest x-ray revealed bilateral infiltrates consistent with pneumonia, and elevated WBC count.  Patient also had elevated lactate, suggestive of sepsis.  I activated code sepsis and ordered broad-spectrum antibiotic's.  Will obtain the remainder of the workup and plan for admission.  Clinical Course as of Mar 21 1709  Thu Mar 21, 2017  1703 Comprehensive metabolic panel(!) [SS]  1707 Lactic acid, plasma [SS]    Clinical Course User Index [SS] Dionne Bucy, MD   ----------------------------------------- 5:11 PM on 03/21/2017 -----------------------------------------  I signed the patient out for admission to the hospitalist Dr. Juliene Pina. ____________________________________________   FINAL CLINICAL IMPRESSION(S) / ED DIAGNOSES  Final diagnoses:  Healthcare-associated pneumonia  Sepsis, due to unspecified organism [facility])      NEW MEDICATIONS STARTED DURING THIS VISIT:  New Prescriptions   No medications on file     Note:  This document was prepared using Dragon voice recognition software and may include unintentional dictation errors.    Dionne Bucy, MD 03/21/17 [id number]

## 2017-03-21 NOTE — ED Notes (Signed)
Hospitalist to bedside at this time 

## 2017-03-21 NOTE — Progress Notes (Signed)
CODE SEPSIS - PHARMACY COMMUNICATION  **Broad Spectrum Antibiotics should be administered within 1 hour of Sepsis diagnosis**  Time Code Sepsis Called/Page Received: 1454  Antibiotics Ordered: cefepime + vancomycin  Time of 1st antibiotic administration: 1513  Cindi CarbonMary M Xzaviar Maloof ,PharmD, BCPS Clinical Pharmacist  03/21/2017  3:21 PM

## 2017-03-21 NOTE — ED Notes (Signed)
Discussed pt with dr lord. 

## 2017-03-21 NOTE — ED Triage Notes (Addendum)
[facility] Mild increased WOB along with tachypnea.  Denies any pain at this time. Is a smoker. Feels like when gets PNA per pt.  Disoriented to month and year in triage.

## 2017-03-22 DIAGNOSIS — E440 Moderate protein-calorie malnutrition: Secondary | ICD-10-CM

## 2017-03-22 LAB — BASIC METABOLIC PANEL
ANION GAP: 6 (ref 5–15)
BUN: 11 mg/dL (ref 6–20)
CALCIUM: 7.5 mg/dL — AB (ref 8.9–10.3)
CO2: 17 mmol/L — AB (ref 22–32)
CREATININE: 0.98 mg/dL (ref 0.61–1.24)
Chloride: 107 mmol/L (ref 101–111)
GFR calc Af Amer: >60 mL/min (ref >60)
GLUCOSE: 80 mg/dL (ref 65–99)
Potassium: 3.3 mmol/L — ABNORMAL LOW (ref 3.5–5.1)
Sodium: 130 mmol/L — ABNORMAL LOW (ref 135–145)

## 2017-03-22 LAB — CBC
HCT: 35.9 % — ABNORMAL LOW (ref 40.0–52.0)
Hemoglobin: 12.4 g/dL — ABNORMAL LOW (ref 13.0–18.0)
MCH: 33.7 pg (ref 26.0–34.0)
MCHC: 34.5 g/dL (ref 32.0–36.0)
MCV: 97.8 fL (ref 80.0–100.0)
PLATELETS: 187 10*3/uL (ref 150–440)
RBC: 3.67 MIL/uL — ABNORMAL LOW (ref 4.40–5.90)
RDW: 14.4 % (ref 11.5–14.5)
WBC: 9.2 10*3/uL (ref 3.8–10.6)

## 2017-03-22 LAB — MAGNESIUM: MAGNESIUM: 1.8 mg/dL (ref 1.7–2.4)

## 2017-03-22 LAB — VITAMIN B12: VITAMIN B 12: 173 pg/mL — AB (ref 180–914)

## 2017-03-22 MED ORDER — ENSURE ENLIVE PO LIQD
237.0000 mL | Freq: Two times a day (BID) | ORAL | Status: DC
  Administered 2017-03-22 – 2017-03-24 (×2): 237 mL via ORAL

## 2017-03-22 MED ORDER — GUAIFENESIN 100 MG/5ML PO SOLN
5.0000 mL | ORAL | Status: DC | PRN
Start: 2017-03-22 — End: 2017-03-24
  Filled 2017-03-22: qty 5

## 2017-03-22 MED ORDER — POTASSIUM CHLORIDE 10 MEQ/100ML IV SOLN
10.0000 meq | INTRAVENOUS | Status: AC
  Administered 2017-03-22: 10 meq via INTRAVENOUS
  Filled 2017-03-22: qty 100

## 2017-03-22 MED ORDER — VITAMIN C 500 MG PO TABS
250.0000 mg | ORAL_TABLET | Freq: Two times a day (BID) | ORAL | Status: DC
  Administered 2017-03-22 – 2017-03-24 (×4): 250 mg via ORAL
  Filled 2017-03-22 (×6): qty 0.5

## 2017-03-22 MED ORDER — PANTOPRAZOLE SODIUM 40 MG IV SOLR
40.0000 mg | Freq: Two times a day (BID) | INTRAVENOUS | Status: DC
  Administered 2017-03-22 – 2017-03-24 (×5): 40 mg via INTRAVENOUS
  Filled 2017-03-22 (×5): qty 40

## 2017-03-22 MED ORDER — VITAMIN B-12 1000 MCG PO TABS
2000.0000 ug | ORAL_TABLET | Freq: Every day | ORAL | Status: DC
  Administered 2017-03-23 – 2017-03-24 (×2): 2000 ug via ORAL
  Filled 2017-03-22 (×2): qty 2

## 2017-03-22 MED ORDER — POTASSIUM CHLORIDE 10 MEQ/100ML IV SOLN
10.0000 meq | INTRAVENOUS | Status: AC
  Administered 2017-03-22 (×2): 10 meq via INTRAVENOUS
  Filled 2017-03-22 (×2): qty 100

## 2017-03-22 NOTE — Progress Notes (Signed)
Pharmacy Antibiotic Note  Marco [name] is a 62 y.o. male admitted on 03/21/2017 with sepsis./pneumonia. Pharmacy has been consulted for cefepime dosing.  Patient recently in hospital 3/12- 03/14/17  Plan:  Day 2- Cefepime 2 g IV q8h  Height: 5\' 5"  (165.1 cm) Weight: 151 lb 0.2 oz (68.5 kg) IBW/kg (Calculated) : 61.5  Temp (24hrs), Avg:97.6 F (36.4 C), Min:97.4 F (36.3 C), Max:98 F (36.7 C)  Recent Labs  Lab 03/17/17 2029 03/21/17 1338 03/21/17 1645 03/22/17 0408  WBC 10.1 15.4*  --  9.2  CREATININE 0.84 1.08  --  0.98  LATICACIDVEN  --  3.6* 3.5*  --     Estimated Creatinine Clearance: 68.9 mL/min (by C-G formula based on SCr of 0.98 mg/dL).    Allergies  Allergen Reactions  . Hydrocodone-Acetaminophen Nausea Only and Nausea And Vomiting   Antimicrobials this admission: cefepime 3/21 >>   Dose adjustments this admission:  Microbiology results: 3/21 BCx: Sent 3/21  MRSA PCR negative  Thank you for allowing pharmacy to be a part of this patient's care.  Angelique BlonderMerrill,Blaise Grieshaber A, PharmD, BCPS Clinical Pharmacist 03/22/2017 8:42 AM

## 2017-03-22 NOTE — Progress Notes (Signed)
Initial Nutrition Assessment  DOCUMENTATION CODES:   Non-severe (moderate) malnutrition in context of chronic illness  INTERVENTION:   Recommend vitamin B12 2073mg daily po  Pt at moderate refeeding risk; recommend monitor K, Mg, and P labs.    Ensure Enlive po BID, each supplement provides 350 kcal and 20 grams of protein  MVI daily  Thiamine 1370WUdaily  Folic acid 111mdaily  Vitamin C 25036mID  Magic cup TID with meals, each supplement provides 290 kcal and 9 grams of protein  NUTRITION DIAGNOSIS:   Moderate Malnutrition related to chronic illness(COPD, etoh abuse ) as evidenced by moderate fat depletion, moderate muscle depletion.  GOAL:   Patient will meet greater than or equal to 90% of their needs  MONITOR:   PO intake, Supplement acceptance, Labs, Weight trends, Skin, I & O's  REASON FOR ASSESSMENT:   Consult Assessment of nutrition requirement/status  ASSESSMENT:   [age]o. male with a known history of EtOh abuse and chronic hyponatremia who was in ED twice this month once for COPD exacerbation (refused admission) and again for gastroenteritis who presents to ED today with SOB. Pt admitted for PNA and sepsis    Met with pt and pt's family member at beside today. Pt is a poor historian so history obtained from pts family member at bedside. Pt reports poor appetite and oral intake for 6 weeks r/t nausea/vomiting. Pt reports chronic nausea, vomiting, diarrhea that has been progressively getting worse. Pt reports h/o reflux disease but denies any feelings of food getting stuck in his throat. Pt requesting GI consult while in hospital. Pt does have a h/o gastroenteritis and chronic etoh abuse. Pt reports a 18lb weight loss in <3 months. Pt reports his UBW is around 160lbs and reports that he weighed 140lbs at the doctors office a few weeks ago. Pt's weight today was around ~152lbs so it appears that pt has lost ~8lbs in 3 months; this is not significant. RD will  order supplements. Pt on CIWA protocol. Pt ate <25% of his lunch today. Pt denies any trouble chewing or swallowing. Pt noted to have bruising on bilateral arms; recommend vitamin C daily. Pt also noted to have low B12; recommend supplementation. Pt at moderate refeeding risk; recommend monitor K, Mg, and P labs.    Medications reviewed and include: lovenox, folic acid, MVI, thiamine, NaCl '@75ml' /hr, cefepime, oxycodone    Labs reviewed: Na 130(L), K 3.3(L), Ca 7.5(L) B12- 173(L)- 3/21  NUTRITION - FOCUSED PHYSICAL EXAM:    Most Recent Value  Orbital Region  Moderate depletion  Upper Arm Region  Moderate depletion  Thoracic and Lumbar Region  Moderate depletion  Buccal Region  Moderate depletion  Temple Region  Moderate depletion  Clavicle Bone Region  Mild depletion  Clavicle and Acromion Bone Region  Mild depletion  Scapular Bone Region  Mild depletion  Dorsal Hand  No depletion  Patellar Region  Mild depletion  Anterior Thigh Region  Mild depletion  Posterior Calf Region  Moderate depletion  Edema (RD Assessment)  None  Hair  Reviewed  Eyes  Reviewed  Mouth  Reviewed  Skin  Reviewed- pt noted to have bruising on bilateral arms  Nails  Reviewed      Diet Order:  Diet regular Room service appropriate? Yes; Fluid consistency: Thin  EDUCATION NEEDS:   Education needs have been addressed  Skin:  Reviewed RN Assessment  Last BM:  3/20  Height:   Ht Readings from Last 1 Encounters:  03/21/17  '5\' 5"'  (1.651 m)    Weight:   Wt Readings from Last 1 Encounters:  03/22/17 152 lb 8.9 oz (69.2 kg)    Ideal Body Weight:  61.8 kg  BMI:  Body mass index is 25.39 kg/m.  Estimated Nutritional Needs:   Kcal:  1800-2100kcal/day   Protein:  82-96g/day   Fluid:  >1.6L/day   Koleen Distance MS, RD, LDN Pager #[phone] After Hours Pager: [phone]

## 2017-03-22 NOTE — Care Management (Signed)
Patient admitted from home with sepsis.  Patient with history of COPD.  Previously patient was discharged with orders for inhalers, however hew as not able to fill the prescriptions due to the cost. This RNCM called his pharmacy [facility], and confirmed that he does not have Medication coverage.  RNCM confirmed with Medication Management that they will be able to assist with medications.  They have Symbicort 80 mg. Advair 250/50 and 500/50, and Albuterol available.   RNCM alerted MD in order to attempt fill prescriptions today, should patient discharge over the weekend.  MD does not feel that this is indicated at this time, due to the patient will likely remain in the hospital over the weekend.  Should patient discharge over the weekend he will require medication assistance.    PT has assessed patient and recommends home health PT.  Patient agreeable to services, and does not have a preference of agency.  [facility]  Patient states that he has a RW and cane in the home.  Currently requiring acute O2.  Medication Management  Application provided to wife.  RNCM following

## 2017-03-22 NOTE — Progress Notes (Signed)
Pharmacy Electrolyte Monitoring Consult:  Pharmacy consulted to assist in monitoring and replacing electrolytes in this 62 y.o. male admitted on 03/21/2017 with Shortness of Breath   Labs:  Sodium (mmol/L)  Date Value  03/22/2017 130 (L)  11/11/2013 138   Potassium (mmol/L)  Date Value  03/22/2017 3.3 (L)  11/11/2013 4.1   Magnesium (mg/dL)  Date Value  16/10/960403/22/2019 1.8   Calcium (mg/dL)  Date Value  54/09/811903/22/2019 7.5 (L)   Calcium, Total (mg/dL)  Date Value  14/78/295611/11/2013 8.5   Albumin (g/dL)  Date Value  21/30/865703/21/2019 2.6 (L)  12/17/2012 3.4    Plan: Potassium 3 runs IV as patient with vomiting this evening per RN. Labs in AM.   Luisa HartChristy, Saanya Zieske D 03/22/2017 6:53 PM

## 2017-03-22 NOTE — Evaluation (Signed)
Physical Therapy Evaluation Patient Details Name: Marco [name] MRN: [id number] DOB: Aug 02, 1955 Today's Date: 03/22/2017   History of Present Illness  presented to ER secondary to progressive SOB x2-3 days; admitted with sepsis realted to [facility]. Of note, patient with two additional presentations to ER this month (COPD x1, gastroenteritis x1)  Clinical Impression  Upon evaluation, patient alert and oriented; generally disinterested in therapy session, but agreeable to bed/chair with encouragement from both therapist and wife.  Bilat UE/LE strength and ROM grossly symmetrical and WFL for basic transfers and mobility; no focal weakness appreciated.  Able to complete bed mobility with mod indep; sit/stand, basic transfers and very short-distance gait (bed/chair) without assist device, sup/mod indep. Slightly impulsive; does prefer UE support (reaching for bedrails, armrests) for external stabilization.  Firmly refused additional activity or gait efforts at this time. Mild SOB noted; sats >98% on RA. Would benefit from skilled PT to address above deficits and promote optimal return to PLOF; Recommend transition to HHPT upon discharge from acute hospitalization to address dynamic balance, activity tolerance and overall home safety (to help prevent future readmissions)     Follow Up Recommendations Home health PT    Equipment Recommendations       Recommendations for Other Services       Precautions / Restrictions Precautions Precautions: Fall Precaution Comments: CIWA Restrictions Weight Bearing Restrictions: No      Mobility  Bed Mobility Overal bed mobility: Modified Independent                Transfers Overall transfer level: Needs assistance   Transfers: Sit to/from Stand Sit to Stand: Supervision            Ambulation/Gait Ambulation/Gait assistance: Supervision Ambulation Distance (Feet): 5 Feet Assistive device: None       General Gait Details: 3-4 steps  from bed/chair only; fair step height/length and LE control; does intermittently reaching for bedrail/armrests for optimal stability.  Mild SOB with minimal exertion (sats 98% on RA); refuses additional distance or activity.  Stairs            Wheelchair Mobility    Modified Rankin (Stroke Patients Only)       Balance Overall balance assessment: Needs assistance Sitting-balance support: No upper extremity supported;Feet supported Sitting balance-Leahy Scale: Good     Standing balance support: No upper extremity supported Standing balance-Leahy Scale: Fair                               Pertinent Vitals/Pain Pain Assessment: No/denies pain    Home Living Family/patient expects to be discharged to:: Private residence Living Arrangements: Spouse/significant other Available Help at Discharge: Family;Available 24 hours/day Type of Home: House       Home Layout: One level Home Equipment: Cane - single point      Prior Function Level of Independence: Needs assistance         Comments: Mod indep with SPC for limited household distances (per wife, to bathroom and back only-approx 10' each way).  Does endorse multiple fall history, but denies within previous six months (attributed to decreased OOB mobility overall per wife)     Hand Dominance        Extremity/Trunk Assessment   Upper Extremity Assessment Upper Extremity Assessment: Overall WFL for tasks assessed(grossly 4-/5 throughout)    Lower Extremity Assessment Lower Extremity Assessment: Overall WFL for tasks assessed(grossly 4-/5 throughout; unable to fully extend bilat knees due to  chronic arthritic changes)       Communication   Communication: No difficulties  Cognition Arousal/Alertness: Awake/alert Behavior During Therapy: Impulsive Overall Cognitive Status: Within Functional Limits for tasks assessed                                 General Comments: generally  disinterested in session and opportunities to improve mobiltiy      General Comments      Exercises     Assessment/Plan    PT Assessment Patient needs continued PT services  PT Problem List Decreased strength;Decreased activity tolerance;Decreased balance;Decreased mobility;Decreased coordination;Decreased cognition;Decreased knowledge of use of DME;Decreased safety awareness;Decreased knowledge of precautions;Cardiopulmonary status limiting activity       PT Treatment Interventions DME instruction;Gait training;Functional mobility training;Therapeutic activities;Therapeutic exercise;Balance training;Patient/family education    PT Goals (Current goals can be found in the Care Plan section)  Acute Rehab PT Goals Patient Stated Goal: to return home with wife PT Goal Formulation: With patient Time For Goal Achievement: 04/05/17 Potential to Achieve Goals: Good    Frequency Min 2X/week   Barriers to discharge Decreased caregiver support      Co-evaluation               AM-PAC PT "6 Clicks" Daily Activity  Outcome Measure Difficulty turning over in bed (including adjusting bedclothes, sheets and blankets)?: None Difficulty moving from lying on back to sitting on the side of the bed? : None Difficulty sitting down on and standing up from a chair with arms (e.g., wheelchair, bedside commode, etc,.)?: None Help needed moving to and from a bed to chair (including a wheelchair)?: A Little Help needed walking in hospital room?: A Little Help needed climbing 3-5 steps with a railing? : A Little 6 Click Score: 21    End of Session Equipment Utilized During Treatment: Gait belt Activity Tolerance: Patient limited by fatigue Patient left: in bed;with family/visitor present(refusing alarm; RN informed/aware) Nurse Communication: Mobility status PT Visit Diagnosis: History of falling (Z91.81);Other symptoms and signs involving the nervous system (R29.898);Muscle weakness  (generalized) (M62.81)    Time: [id number] PT Time Calculation (min) (ACUTE ONLY): 14 min   Charges:   PT Evaluation $PT Eval Low Complexity: 1 Low     PT G Codes:        Bayden Gil H. Manson PasseyBrown, PT, DPT, NCS 03/22/17, 12:07 PM [id number]

## 2017-03-22 NOTE — Progress Notes (Signed)
[facility] [place] at [facility]   PATIENT NAME: Marco RegisterMichael [name]    MR#:  [id number]  DATE OF BIRTH:  May 06, 1955  SUBJECTIVE:  CHIEF COMPLAINT:   Chief Complaint  Patient presents with  . Shortness of Breath    REVIEW OF SYSTEMS:    ROS  Nutrition: Tolerating Diet: Tolerating PT:      DRUG ALLERGIES:   Allergies  Allergen Reactions  . Hydrocodone-Acetaminophen Nausea Only and Nausea And Vomiting    VITALS:  Blood pressure 139/82, pulse 96, temperature 97.7 F (36.5 C), temperature source Oral, resp. rate (!) 21, height 5\' 5"  (1.651 m), weight 69.2 kg (152 lb 8.9 oz), SpO2 99 %.  PHYSICAL EXAMINATION:   Physical Exam  GENERAL:  62 y.o.-year-old patient lying in the bed with no acute distress.  EYES: Pupils equal, round, reactive to light and accommodation. No scleral icterus. Extraocular muscles intact.  HEENT: Head atraumatic, normocephalic. Oropharynx and nasopharynx clear.  NECK:  Supple, no jugular venous distention. No thyroid enlargement, no tenderness.  LUNGS: Normal breath sounds bilaterally, no wheezing, rales,rhonchi or crepitation. No use of accessory muscles of respiration.  CARDIOVASCULAR: S1, S2 normal. No murmurs, rubs, or gallops.  ABDOMEN: Soft, nontender, nondistended. Bowel sounds present. No organomegaly or mass.  EXTREMITIES: No pedal edema, cyanosis, or clubbing.  NEUROLOGIC: Cranial nerves II through XII are intact. Muscle strength 5/5 in all extremities. Sensation intact. Gait not checked.  PSYCHIATRIC: The patient is alert and oriented x 3.  SKIN: No obvious rash, lesion, or ulcer.    LABORATORY PANEL:   CBC Recent Labs  Lab 03/22/17 0408  WBC 9.2  HGB 12.4*  HCT 35.9*  PLT 187   ------------------------------------------------------------------------------------------------------------------  Chemistries  Recent Labs  Lab 03/21/17 1338 03/22/17 0408  NA 128* 130*  K 3.3* 3.3*  CL 99* 107  CO2  15* 17*  GLUCOSE 117* 80  BUN 10 11  CREATININE 1.08 0.98  CALCIUM 8.6* 7.5*  AST 40  --   ALT 14*  --   ALKPHOS 114  --   BILITOT 1.8*  --    ------------------------------------------------------------------------------------------------------------------  Cardiac Enzymes Recent Labs  Lab 03/21/17 1338  TROPONINI <0.03   ------------------------------------------------------------------------------------------------------------------  RADIOLOGY:  Dg Chest 2 View  Result Date: 03/21/2017 CLINICAL DATA:  Not feeling well.  Shortness of breath. EXAM: CHEST - 2 VIEW COMPARISON:  03/17/2017. FINDINGS: BILATERAL airspace opacities have worsened RIGHT greater than LEFT since the prior radiograph consistent with pneumonia. No effusion or pneumothorax. No significant lobar atelectasis. Normal heart size. No bony abnormality. Suspected COPD. IMPRESSION: Worsening aeration suggesting BILATERAL pneumonia, worse on the RIGHT. Electronically Signed   By: Elsie StainJohn T Curnes M.D.   On: 03/21/2017 14:06     ASSESSMENT AND PLAN:   Active Problems:   Sepsis (HCC)   Malnutrition of moderate degree  #1 sepsis secondary to healthcare associated pneumonia: Multiple recurrent admissions, just discharged recently on 14 March comes back with poor p.o. intake, nausea, vomiting and found to have sepsis with bilateral pneumonia: Lactic acid level is coming down to 3.5 on admission.  Continue antibiotics. with cefepime   #2 nausea, burning in the stomach, poor p.o. intake, wife mentioned that patient is here multiple times, and patient continues to have poor p.o. intake.  And she wants to  GI consult.  Added IV PPIs, IV Zofran, GI consult.  Possible gastritis.  Patient continues to smoke but last alcohol drink was 3 weeks ago. 3.  COPD: Patient does have  some wheezing.  Continue bronchodilators, patient unable to afford , Dulera that was given last time.  Recommend change to Advair and also Spiriva when  discharged this time.  Discussed with case Production designer, theatre/television/film. 4.  History of physical: Continue Keppra. History of depression, anxiety: Continue fluoxetine #5 chronic diarrhea, nausea, GERD: GI consult requested, check stool cultures, C. difficile, GI panel, continue PPIs. 6.  Diabetes, advised patient to quit smoking.  Patient does have wheezing, continue bronchodilators. #7 hyponatremia, hypokalemia, hypomagnesemia continue to replace lites. 7. moderate malnutrition in the context of chronic illness: Slight B12 deficiency; continue folic acid, thiamine, multivitamins, Ensure and live, Magic cup 3 times daily, B12 2000 mcg p.o. daily. , All the records are reviewed and case discussed with Care Management/Social Workerr. Management plans discussed with the patient, family and they are in agreement.  CODE STATUS: Full code  TOTAL TIME TAKING CARE OF THIS PATIENT: 35 minutes.   POSSIBLE D/C IN 1-2 DAYS, DEPENDING ON CLINICAL CONDITION.   Katha Hamming M.D on 03/22/2017 at 2:50 PM  Between 7am to 6pm - Pager - [phone]  After 6pm go to [email or web address] - password [email or web address] [facility]  [place] [facility]  Office  [phone]  CC: Primary care physician; Jerl Mina, MD

## 2017-03-22 NOTE — Progress Notes (Signed)
Palliative Medicine consult noted. Due to high referral volume, there may be a delay seeing this patient. Please call the Palliative Medicine Team office at [phone] if recommendations are needed in the interim.  Thank you for inviting us to see this patient.  Margret ChanceMelanie G. Alfonza Toft, RN, BSN, [facility] Palliative Medicine Team 03/22/2017 3:11 PM Office [phone]

## 2017-03-22 NOTE — Care Management Important Message (Signed)
Important Message  Patient Details  Name: Lisabeth RegisterMichael Ashurst MRN: [id number] Date of Birth: 01/07/1955   Medicare Important Message Given:  Yes    Chapman FitchBOWEN, Shaylee Stanislawski T, RN 03/22/2017, 3:34 PM

## 2017-03-23 ENCOUNTER — Inpatient Hospital Stay: Payer: Medicare Other

## 2017-03-23 DIAGNOSIS — R112 Nausea with vomiting, unspecified: Secondary | ICD-10-CM

## 2017-03-23 DIAGNOSIS — F10230 Alcohol dependence with withdrawal, uncomplicated: Secondary | ICD-10-CM

## 2017-03-23 LAB — BASIC METABOLIC PANEL
ANION GAP: 5 (ref 5–15)
BUN: 12 mg/dL (ref 6–20)
CALCIUM: 8.1 mg/dL — AB (ref 8.9–10.3)
CO2: 18 mmol/L — AB (ref 22–32)
Chloride: 113 mmol/L — ABNORMAL HIGH (ref 101–111)
Creatinine, Ser: 0.97 mg/dL (ref 0.61–1.24)
GFR calc non Af Amer: >60 mL/min (ref >60)
Glucose, Bld: 83 mg/dL (ref 65–99)
POTASSIUM: 3.9 mmol/L (ref 3.5–5.1)
Sodium: 136 mmol/L (ref 135–145)

## 2017-03-23 LAB — PHOSPHORUS: Phosphorus: 2.9 mg/dL (ref 2.5–4.6)

## 2017-03-23 LAB — AMMONIA: Ammonia: 32 umol/L (ref 9–35)

## 2017-03-23 LAB — MAGNESIUM: Magnesium: 1.9 mg/dL (ref 1.7–2.4)

## 2017-03-23 MED ORDER — LACTULOSE 10 GM/15ML PO SOLN: 30.0000 g | Freq: Two times a day (BID) | ORAL | Status: DC

## 2017-03-23 MED ORDER — LACTULOSE 10 GM/15ML PO SOLN
30.0000 g | Freq: Every day | ORAL | Status: DC
  Administered 2017-03-24: 30 g via ORAL
  Filled 2017-03-23: qty 60

## 2017-03-23 MED ORDER — METHYLPREDNISOLONE SODIUM SUCC 125 MG IJ SOLR
60.0000 mg | INTRAMUSCULAR | Status: DC
  Administered 2017-03-23: 60 mg via INTRAVENOUS
  Filled 2017-03-23: qty 2

## 2017-03-23 MED ORDER — METHYLPREDNISOLONE SODIUM SUCC 40 MG IJ SOLR
40.0000 mg | Freq: Two times a day (BID) | INTRAMUSCULAR | Status: DC
  Administered 2017-03-23 – 2017-03-24 (×2): 40 mg via INTRAVENOUS
  Filled 2017-03-23 (×2): qty 1

## 2017-03-23 MED ORDER — TAMSULOSIN HCL 0.4 MG PO CAPS
0.4000 mg | ORAL_CAPSULE | Freq: Every day | ORAL | Status: DC
  Administered 2017-03-23 – 2017-03-24 (×2): 0.4 mg via ORAL
  Filled 2017-03-23 (×2): qty 1

## 2017-03-23 NOTE — Progress Notes (Signed)
Sleeping and drowsy. Held A.M. meds due to drowsiness. Not drinking fluids or eating so far tday. He is easily arousable and can follow simple commands but he is drowsy. Wife, Pat at bedside.

## 2017-03-23 NOTE — Progress Notes (Signed)
Pharmacy Electrolyte Monitoring Consult:  Pharmacy consulted to assist in monitoring and replacing electrolytes in this 62 y.o. male admitted on 03/21/2017 with Shortness of Breath   Labs:  Sodium (mmol/L)  Date Value  03/23/2017 136  11/11/2013 138   Potassium (mmol/L)  Date Value  03/23/2017 3.9  11/11/2013 4.1   Magnesium (mg/dL)  Date Value  40/98/119103/23/2019 1.9   Phosphorus (mg/dL)  Date Value  47/82/956203/23/2019 2.9   Calcium (mg/dL)  Date Value  13/08/657803/23/2019 8.1 (L)   Calcium, Total (mg/dL)  Date Value  46/96/295211/11/2013 8.5   Albumin (g/dL)  Date Value  84/13/244003/21/2019 2.6 (L)  12/17/2012 3.4    Plan: Electrolytes WNL.  Recheck with am labs.   Stormy CardKatsoudas,Tomisha Reppucci K, [facility] 03/23/2017 10:00 AM

## 2017-03-23 NOTE — Progress Notes (Signed)
Order to saline lock IV , left running at 30ml hr since he's been so drowsy today.

## 2017-03-23 NOTE — Progress Notes (Signed)
[facility] - [place] at [facility]   PATIENT NAME: Marco [name]    MR#:  [id number]  DATE OF BIRTH:  10/15/1955  SUBJECTIVE:   Patient confused this am   REVIEW OF SYSTEMS:    Unable to obtain patient confused   Tolerating Diet: yes      DRUG ALLERGIES:   Allergies  Allergen Reactions  . Hydrocodone-Acetaminophen Nausea Only and Nausea And Vomiting    VITALS:  Blood pressure 140/67, pulse (!) 102, temperature 97.7 F (36.5 C), temperature source Oral, resp. rate (!) 24, height 5\' 5"  (1.651 m), weight 73.9 kg (163 lb), SpO2 100 %.  PHYSICAL EXAMINATION:  Constitutional: Appears well-developed and well-nourished. No distress. HENT: Normocephalic. Marland Kitchen Oropharynx is clear and moist.  Eyes: Conjunctivae and EOM are normal. PERRLA, no scleral icterus.  Neck: Normal ROM. Neck supple. No JVD. No tracheal deviation. CVS: RRR, S1/S2 +, no murmurs, no gallops, no carotid bruit.  Pulmonary: wheezing b/l no rales. Crackles rhonchii   Abdominal: Soft. BS +,  no distension, tenderness, rebound or guarding.  Musculoskeletal: Normal range of motion. No edema and no tenderness.  Neuro: Alert. . No focal deficits. Skin: Skin is warm and dry. No rash noted. Psychiatric: confused    LABORATORY PANEL:   CBC Recent Labs  Lab 03/22/17 0408  WBC 9.2  HGB 12.4*  HCT 35.9*  PLT 187   ------------------------------------------------------------------------------------------------------------------  Chemistries  Recent Labs  Lab 03/21/17 1338  03/23/17 0733  NA 128*   < > 136  K 3.3*   < > 3.9  CL 99*   < > 113*  CO2 15*   < > 18*  GLUCOSE 117*   < > 83  BUN 10   < > 12  CREATININE 1.08   < > 0.97  CALCIUM 8.6*   < > 8.1*  MG  --    < > 1.9  AST 40  --   --   ALT 14*  --   --   ALKPHOS 114  --   --   BILITOT 1.8*  --   --    < > = values in this interval not displayed.    ------------------------------------------------------------------------------------------------------------------  Cardiac Enzymes Recent Labs  Lab 03/17/17 2029 03/21/17 1338  TROPONINI <0.03 <0.03   ------------------------------------------------------------------------------------------------------------------  RADIOLOGY:  Dg Chest 2 View  Result Date: 03/21/2017 CLINICAL DATA:  Not feeling well.  Shortness of breath. EXAM: CHEST - 2 VIEW COMPARISON:  03/17/2017. FINDINGS: BILATERAL airspace opacities have worsened RIGHT greater than LEFT since the prior radiograph consistent with pneumonia. No effusion or pneumothorax. No significant lobar atelectasis. Normal heart size. No bony abnormality. Suspected COPD. IMPRESSION: Worsening aeration suggesting BILATERAL pneumonia, worse on the RIGHT. Electronically Signed   By: Elsie Stain M.D.   On: 03/21/2017 14:06     ASSESSMENT AND PLAN:   62 y/o male with history of etoh abuse, weight loss and seizure disorder who presents to ED with SOB.  1. Sepsis: Patient presented with tachypnea, leukocytosis, tachycardia and elevated lactic acid. Sepsis is due to HCAP.  2.acute hypoxic respiratory failure in the setting of HCAP acute COPD exacerbation: Continue cefepime MRSA PCR ordered and if positive can restart vancomycin. Start IV steroids and duo nebs Wean oxygen as tolerated Echocardiogram June 2018 shows normal ejection fraction and no signs of diastolic heart failure.  3.  Hyponatremia: Sodium has improved with IVF  4.  EtOH abuse: CIWA protocol initiated  5.  Confusion: Apparently patient has  a follow-up with neurology in a couple weeks. CT head March 17 shows Stable involutional changes of the brain with small vessel ischemic disease. No acute intracranial abnormality. Ammonia level, TSH and Keppra level were all within normal limits.  B12 is low and has been started on B12 supplements.  Patient can follow-up with  neurology outpatient as already scheduled. Likely will need EEG and MRI outpatient.  6. Tobacco dependence: Patient is encouraged to quit smoking. Counseling was provided on admission  7.  Hypokalemia: This has been repleted and is within normal limits today.  8.  History of seizures: Continue Keppra 9.  Nausea and vomiting: Patient evaluate by GI. This may be related to EtOH withdrawal. Continue supportive management.   PT evaluation for discharge planning Palliative care consultation pending for goals of care        CODE STATUS: FULl  TOTAL TIME TAKING CARE OF THIS PATIENT: 27 minutes.     POSSIBLE D/C 1-2 day, DEPENDING ON CLINICAL CONDITION.   Karoline Fleer M.D on 03/23/2017 at 11:51 AM  Between 7am to 6pm - Pager - [phone] After 6pm go to [email or web address] - password [email or web address]  [facility] [place] [facility]  Office  [phone]  CC: Primary care physician; Jerl MinaHedrick, James, MD  Note: This dictation was prepared with Dragon dictation along with smaller phrase technology. Any transcriptional errors that result from this process are unintentional.

## 2017-03-23 NOTE — Consult Note (Addendum)
Marco Antigua, MD [address] [place], [place], [place], [address] [address], [place], [place], [place], [address] Phone: [phone]  Fax: [phone]  Consultation  Referring Provider:     Dr. Benjie [name] Primary Care Physician:  Marco Pink, MD Primary Gastroenterologist:  [name]      Reason for Consultation:    Nausea/vomiting  Date of Admission:  03/21/2017 Date of Consultation:  03/23/2017         HPI:   Marco [name] is a 62 y.o. male, patient of Dr. Alice [name] with [name], with history of alcohol liver cirrhosis.  Presented with shortness of breath on this admission, and confusion at home over the last 3-4 weeks.  Chest x-ray shows worsening areation suggesting bilateral pneumonia, worse on the right.  Patient received Ativan right before he went to into his room, and is somewhat somnolent.  Wife states he was a week before that and was able to have full conversation.  History obtained from wife and previous chart.  Reports nausea and vomiting at home intermittently, every day, 2-3 times a day, ongoing for a month.  No blood in emesis.  No abdominal pain.  Wife states last drink was 2-3 weeks ago, and he drinks 6-8 beers daily.  Wife denies any NSAID use at home.  He was recently discharged on March 14, 2017, when he was admitted with nausea, vomiting, weakness, and symptoms were thought to be due to acute gastroenteritis and improved with IV fluids.  He was also noted to be hyponatremic and hypokalemic at the time and received replacement. H&P reports that patient was Here earlier this month for COPD exacerbation and refused admission at the time.  He was seen by [facility] clinic GI on March 4, and they noted that patient had been having nausea vomiting confusion at home and recommended ER evaluation.  Patient left AMA from the ER at that time.  No previous colonoscopies or endoscopies.  Hemoglobin is 12 on admission.  Platelets 180s.   INR at 1.3.  Total bilirubin mildly elevated at 1.8, normal alk phos. CT abdomen on March 4, shows nodularity of the liver.  Subcentimeter hypodensities.  No biliary dilation.  Normal gallbladder. "Stomach/Bowel: Small hiatal hernia. Physiologic distention of the stomach. Mild small bowel distention with fluid with slight fold thickening in the central abdomen compatible with an enteritis. No mechanical source of obstruction is seen. Decompressed descending colon through rectum. Liquid stool noted within the cecum and ascending colon. No evidence of appendicitis."   Past Medical History:  Diagnosis Date  . Anxiety   . Arthritis   . Depression   . Heartburn   . Seizures ([place])     Past Surgical History:  Procedure Laterality Date  . KNEE ARTHROSCOPY  1975    Prior to Admission medications   Medication Sig Start Date End Date Taking? Authorizing Provider  ondansetron (ZOFRAN ODT) 4 MG disintegrating tablet Take 1 tablet (4 mg total) by mouth every 8 (eight) hours as needed for nausea or vomiting. 03/04/17  Yes Eula Listen, MD  albuterol (PROVENTIL HFA;VENTOLIN HFA) 108 (90 Base) MCG/ACT inhaler Inhale 2 puffs into the lungs every 6 (six) hours as needed for wheezing or shortness of breath. Patient not taking: Reported on 03/21/2017 03/14/17   Gladstone Lighter, MD  levETIRAcetam (KEPPRA) 750 MG tablet Take 750 mg by mouth 2 (two) times daily.  01/20/15   [provider]  loperamide (IMODIUM A-D) 2 MG tablet Take 1  tablet (2 mg total) by mouth 4 (four) times daily as needed for diarrhea or loose stools. Patient not taking: Reported on 03/21/2017 03/04/17   Eula Listen, MD  mometasone-formoterol [facility]) 200-5 MCG/ACT AERO Inhale 2 puffs into the lungs 2 (two) times daily. Patient not taking: Reported on 03/21/2017 03/14/17   Gladstone Lighter, MD  pantoprazole (PROTONIX) 40 MG tablet Take 1 tablet (40 mg total) by mouth daily. Patient not taking: Reported on  03/21/2017 03/15/17   Gladstone Lighter, MD    Family History  Problem Relation Age of Onset  . Diabetes Mellitus II Mother   . Breast cancer Mother   . Kidney disease Neg Hx   . Prostate cancer Neg Hx      Social History   Tobacco Use  . Smoking status: Current Every Day Smoker    Packs/day: 1.00  . Smokeless tobacco: Never Used  Substance Use Topics  . Alcohol use: Yes    Alcohol/week: 2.4 oz    Types: 4 Cans of beer per week  . Drug use: No    Allergies as of 03/21/2017 - Review Complete 03/21/2017  Allergen Reaction Noted  . Hydrocodone-acetaminophen Nausea Only and Nausea And Vomiting 06/08/2015    Review of Systems:    All systems reviewed and negative except where noted in HPI.   Physical Exam:  Vital signs in last 24 hours: Vitals:   03/22/17 1350 03/22/17 1419 03/22/17 2113 03/23/17 0640  BP:  139/82 119/71 140/67  Pulse:  96 94 (!) 102  Resp:  (!) 21 16 (!) 24  Temp:  97.7 F (36.5 C) 98.3 F (36.8 C) 97.7 F (36.5 C)  TempSrc:  Oral Oral Oral  SpO2:  99% 100% 100%  Weight: 152 lb 8.9 oz (69.2 kg)   163 lb (73.9 kg)  Height:       Last BM Date: 03/22/17 General:   Pleasant, cooperative in NAD Head:  Normocephalic and atraumatic. Eyes:   No icterus.   Conjunctiva [name]. PERRLA. Ears:  Normal auditory acuity. Neck:  Supple; no masses or thyroidomegaly Lungs: Increased work of respiration.  Heart:  Regular rate and rhythm;  Without murmur, clicks, rubs or gallops Abdomen:  Soft, nondistended, nontender. Normal bowel sounds. No appreciable masses or hepatomegaly.  No rebound or guarding.  Neurologic:   grossly normal neurologically. Skin:  Intact without significant lesions or rashes.   LAB RESULTS: Recent Labs    03/21/17 1338 03/22/17 0408  WBC 15.4* 9.2  HGB 15.1 12.4*  HCT 45.1 35.9*  PLT 259 187   BMET Recent Labs    03/21/17 1338 03/22/17 0408  NA 128* 130*  K 3.3* 3.3*  CL 99* 107  CO2 15* 17*  GLUCOSE 117* 80  BUN 10 11    CREATININE 1.08 0.98  CALCIUM 8.6* 7.5*   LFT Recent Labs    03/21/17 1338  PROT 6.9  ALBUMIN 2.6*  AST 40  ALT 14*  ALKPHOS 114  BILITOT 1.8*   PT/INR Recent Labs    03/21/17 1338  LABPROT 16.2*  INR 1.31    STUDIES: Dg Chest 2 View  Result Date: 03/21/2017 CLINICAL DATA:  Not feeling well.  Shortness of breath. EXAM: CHEST - 2 VIEW COMPARISON:  03/17/2017. FINDINGS: BILATERAL airspace opacities have worsened RIGHT greater than LEFT since the prior radiograph consistent with pneumonia. No effusion or pneumothorax. No significant lobar atelectasis. Normal heart size. No bony abnormality. Suspected COPD. IMPRESSION: Worsening aeration suggesting BILATERAL pneumonia, worse on the RIGHT.  Electronically Signed   By: Staci Righter M.D.   On: 03/21/2017 14:06      Impression / Plan:   Marco [name] is a 62 y.o. y/o male with history of alcoholic liver cirrhosis, followed by [facility] clinic GI, admitted with nausea vomiting, and bilateral pneumonia  CT scan on March 04, 2017 did not show any evidence of obstruction.  Mild enteritis was suggested. Would recommend ruling out infectious causes as patient is also reporting diarrhea. Obtain stool GI panel, and rule out C. difficile Okay to continue Protonix twice daily for 1-2 days, and if no bleeding, can decrease to once daily for 2-3 weeks.  Symptoms are possibly due to alcohol withdrawal as well, given that patient stopped drinking 2-3 weeks ago abruptly Continue conservative management for nausea and vomiting with antiemetics.  Patient has not had any episodes of emesis today.  Patient has not had any loose stools since being in the hospital since yesterday. Continue CIWA protocol Would recommend IV folate and thiamine Replace electrolytes as appropriate  Due to continued opacities on chest x-ray on this admission, compared to previous CT scan, would recommend treating for pneumonia as per primary team and consider pulmonary  consult  No episodes of GI bleeding at home or in the hospital.  Hemoglobin stable.  No indication for urgent EGD at this time. Patient would need medical optimization, treatment of pneumonia, prior to elective endoscopies.  This was planned by [facility] clinic GI, and can be done as an outpatient or inpatient once patient is medically stable.  Liver cirrhosis workup can be completed on an elective basis by [facility] clinic GI.   Thank you for involving me in the care of this patient.      LOS: 2 days   Virgel Manifold, MD  03/23/2017, 8:18 AM

## 2017-03-23 NOTE — Progress Notes (Signed)
Pt able to void ~16450ml, very drowsy. In and out cath'd for 1000ml dark clear urine.  Haven't given a.m. meds yet, too drowsy to take. Wife @ bedside.

## 2017-03-23 NOTE — Progress Notes (Signed)
Unable to void, bladder scanned for 440, cathed for 450ml clear dark orange urine-[id number] foley left in place per order.

## 2017-03-24 LAB — BASIC METABOLIC PANEL
Anion gap: 6 (ref 5–15)
BUN: 15 mg/dL (ref 6–20)
CALCIUM: 8.4 mg/dL — AB (ref 8.9–10.3)
CHLORIDE: 112 mmol/L — AB (ref 101–111)
CO2: 18 mmol/L — ABNORMAL LOW (ref 22–32)
CREATININE: 0.91 mg/dL (ref 0.61–1.24)
GFR calc non Af Amer: >60 mL/min (ref >60)
Glucose, Bld: 136 mg/dL — ABNORMAL HIGH (ref 65–99)
Potassium: 4.3 mmol/L (ref 3.5–5.1)
SODIUM: 136 mmol/L (ref 135–145)

## 2017-03-24 LAB — MAGNESIUM: MAGNESIUM: 2 mg/dL (ref 1.7–2.4)

## 2017-03-24 LAB — RPR: RPR Ser Ql: NONREACTIVE

## 2017-03-24 LAB — PHOSPHORUS: PHOSPHORUS: 3.7 mg/dL (ref 2.5–4.6)

## 2017-03-24 MED ORDER — LEVETIRACETAM 750 MG PO TABS: 750.0000 mg | ORAL_TABLET | Freq: Two times a day (BID) | ORAL | 0 refills | Status: AC

## 2017-03-24 MED ORDER — TAMSULOSIN HCL 0.4 MG PO CAPS: 0.4000 mg | ORAL_CAPSULE | Freq: Every day | ORAL | 0 refills | Status: AC

## 2017-03-24 MED ORDER — AMOXICILLIN-POT CLAVULANATE 875-125 MG PO TABS: 1.0000 | ORAL_TABLET | Freq: Two times a day (BID) | ORAL | 0 refills | Status: DC

## 2017-03-24 MED ORDER — CYANOCOBALAMIN 2000 MCG PO TABS: 2000.0000 ug | ORAL_TABLET | Freq: Every day | ORAL | 0 refills | Status: DC

## 2017-03-24 MED ORDER — NICOTINE 21 MG/24HR TD PT24
21.0000 mg | MEDICATED_PATCH | Freq: Every day | TRANSDERMAL | Status: DC
  Administered 2017-03-24: 21 mg via TRANSDERMAL
  Filled 2017-03-24: qty 1

## 2017-03-24 MED ORDER — ENSURE ENLIVE PO LIQD: 237.0000 mL | Freq: Two times a day (BID) | ORAL | 12 refills | Status: DC

## 2017-03-24 MED ORDER — ALBUTEROL SULFATE HFA 108 (90 BASE) MCG/ACT IN AERS: 2.0000 | INHALATION_SPRAY | Freq: Four times a day (QID) | RESPIRATORY_TRACT | 2 refills | Status: AC | PRN

## 2017-03-24 MED ORDER — TAMSULOSIN HCL 0.4 MG PO CAPS: 0.4000 mg | ORAL_CAPSULE | Freq: Every day | ORAL | 0 refills | Status: DC

## 2017-03-24 MED ORDER — NICOTINE 21 MG/24HR TD PT24: 21.0000 mg | MEDICATED_PATCH | Freq: Every day | TRANSDERMAL | 0 refills | Status: DC

## 2017-03-24 NOTE — Discharge Summary (Signed)
[facility] [place] [facility]   PATIENT NAME: Marco RegisterMichael [name]    MR#:  [id number]  DATE OF BIRTH:  09-15-1955  DATE OF ADMISSION:  03/21/2017 ADMITTING PHYSICIAN: Adrian SaranSital Madlyn Crosby, MD  DATE OF DISCHARGE: 03/24/2017  PRIMARY CARE PHYSICIAN: Marco [name], James, MD    ADMISSION DIAGNOSIS:  Healthcare-associated pneumonia [J18.9] Sepsis, due to unspecified organism (HCC) [A41.9]  DISCHARGE DIAGNOSIS:  Active Problems:   Sepsis (HCC)   Malnutrition of moderate degree   SECONDARY DIAGNOSIS:   Past Medical History:  Diagnosis Date  . Anxiety   . Arthritis   . Depression   . Heartburn   . Seizures [facility](HCC)     HOSPITAL COURSE:   62 y/o male with history of etoh abuse, weight loss and seizure disorder who presents to ED with SOB.  1. Sepsis:Patient presented with tachypnea, leukocytosis, tachycardia and elevated lactic acid. Sepsis is due toHCAP.  2.acute hypoxic respiratory failure in the setting of HCAP and acute COPD exacerbation: He was on Cefepime and will be discharged on Augmentin. He was weaned off of O2. Echocardiogram June 2018 shows normal ejection fraction and no signs of diastolic heart failure. He does not need steroids at discharge. His wheezing has improved.  3. Hyponatremia: Sodium has improved with IVF  4. EtOH abuse: CIWA protocol initiated and he had uneventful detox.   5. Confusion: Apparently patient has a follow-up with neurology in a couple weeks. CT head March 17 showsStable involutional changes of the brain with small vessel ischemic disease. No acute intracranial abnormality. Ammonia level, TSH and Keppra level were all within normal limits.  B12 is low and he has been started on B12 supplements.  Patient can follow-up with neurology outpatient as already scheduled. Likely will need EEG and MRI outpatient.  6.Tobacco dependence: Patient is encouraged to quit smoking. Counseling was provided on admission  7.  Hypokalemia: This has been repleted..  8. History of seizures: Continue Keppra 9.  Nausea and vomiting: Patient was evaluated by GI. This may be related to EtOH withdrawal. Continue supportive management.   10.  Urinary retention: Patient started on Flomax. Voiding trial in 1 week.  DISCHARGE CONDITIONS AND DIET:   Stable Low sodium diet  CONSULTS OBTAINED:  Treatment Team:  Wyline MoodAnna, Kiran, MD Pasty Spillersahiliani, Varnita B, MD  DRUG ALLERGIES:   Allergies  Allergen Reactions  . Hydrocodone-Acetaminophen Nausea Only and Nausea And Vomiting    DISCHARGE MEDICATIONS:   Allergies as of 03/24/2017      Reactions   Hydrocodone-acetaminophen Nausea Only, Nausea And Vomiting      Medication List    STOP taking these medications   loperamide 2 MG tablet Commonly known as:  IMODIUM A-D   ondansetron 4 MG disintegrating tablet Commonly known as:  ZOFRAN ODT     TAKE these medications   albuterol 108 (90 Base) MCG/ACT inhaler Commonly known as:  PROVENTIL HFA;VENTOLIN HFA Inhale 2 puffs into the lungs every 6 (six) hours as needed for wheezing or shortness of breath.   amoxicillin-clavulanate 875-125 MG tablet Commonly known as:  AUGMENTIN Take 1 tablet by mouth 2 (two) times daily.   cyanocobalamin 2000 MCG tablet Take 1 tablet (2,000 mcg total) by mouth daily. Start taking on:  03/25/2017   feeding supplement (ENSURE ENLIVE) Liqd Take 237 mLs by mouth 2 (two) times daily between meals.   levETIRAcetam 750 MG tablet Commonly known as:  KEPPRA Take 750 mg by mouth 2 (two) times daily.   mometasone-formoterol 200-5 MCG/ACT Aero  Commonly known as:  DULERA Inhale 2 puffs into the lungs 2 (two) times daily.   nicotine 21 mg/24hr patch Commonly known as:  NICODERM CQ - dosed in mg/24 hours Place 1 patch (21 mg total) onto the skin daily. Start taking on:  03/25/2017   pantoprazole 40 MG tablet Commonly known as:  PROTONIX Take 1 tablet (40 mg total) by mouth daily.    tamsulosin 0.4 MG Caps capsule Commonly known as:  FLOMAX Take 1 capsule (0.4 mg total) by mouth daily. Start taking on:  03/25/2017            Durable Medical Equipment  (From admission, onward)        Start     Ordered   03/24/17 1122  For home use only DME Dan Humphreys  [facility])  Once    Question:  Patient needs a walker to treat with the following condition  Answer:  Weakness   03/24/17 1122        Today   CHIEF COMPLAINT:   Awake no issues weak   VITAL SIGNS:  Blood pressure 140/88, pulse 96, temperature 98.5 F (36.9 C), resp. rate (!) 24, height 5\' 5"  (1.651 m), weight 75 kg (165 lb 6.4 oz), SpO2 99 %.   REVIEW OF SYSTEMS:  Review of Systems  Constitutional: Positive for malaise/fatigue. Negative for chills and fever.  HENT: Negative.  Negative for ear discharge, ear pain, hearing loss, nosebleeds and sore throat.   Eyes: Negative.  Negative for blurred vision and pain.  Respiratory: Positive for cough. Negative for hemoptysis, shortness of breath and wheezing.   Cardiovascular: Negative.  Negative for chest pain, palpitations and leg swelling.  Gastrointestinal: Negative.  Negative for abdominal pain, blood in stool, diarrhea, nausea and vomiting.  Genitourinary: Negative.  Negative for dysuria.  Musculoskeletal: Negative.  Negative for back pain.  Skin: Negative.   Neurological: Negative for dizziness, tremors, speech change, focal weakness, seizures and headaches.  Endo/Heme/Allergies: Negative.  Does not bruise/bleed easily.  Psychiatric/Behavioral: Positive for memory loss. Negative for depression, hallucinations and suicidal ideas.     PHYSICAL EXAMINATION:  GENERAL:  62 y.o.-year-old patient lying in the bed with no acute distress.  NECK:  Supple, no jugular venous distention. No thyroid enlargement, no tenderness.  LUNGS: Normal breath sounds bilaterally, no wheezing, rales,rhonchi  No use of accessory muscles of respiration.  CARDIOVASCULAR: S1,  S2 normal. No murmurs, rubs, or gallops.  ABDOMEN: Soft, non-tender, non-distended. Bowel sounds present. No organomegaly or mass.  EXTREMITIES: No pedal edema, cyanosis, or clubbing.  PSYCHIATRIC: The patient is alert and oriented x 3.  SKIN: No obvious rash, lesion, or ulcer.   DATA REVIEW:   CBC Recent Labs  Lab 03/22/17 0408  WBC 9.2  HGB 12.4*  HCT 35.9*  PLT 187    Chemistries  Recent Labs  Lab 03/21/17 1338  03/24/17 0612  NA 128*   < > 136  K 3.3*   < > 4.3  CL 99*   < > 112*  CO2 15*   < > 18*  GLUCOSE 117*   < > 136*  BUN 10   < > 15  CREATININE 1.08   < > 0.91  CALCIUM 8.6*   < > 8.4*  MG  --    < > 2.0  AST 40  --   --   ALT 14*  --   --   ALKPHOS 114  --   --   BILITOT 1.8*  --   --    < > =  values in this interval not displayed.    Cardiac Enzymes Recent Labs  Lab 03/17/17 2029 03/21/17 1338  TROPONINI <0.03 <0.03    Microbiology Results  @MICRORSLT48 @  RADIOLOGY:  Dg Chest 1 View  Result Date: 03/23/2017 CLINICAL DATA:  Shortness of breath for the past 3 days.  Smoker. EXAM: CHEST  1 VIEW COMPARISON:  03/21/2017. FINDINGS: Interval borderline enlargement of the cardiac silhouette and extensive right lung airspace opacity. The interstitial markings on the left are prominent with faint airspace opacities with mild progression. No pleural fluid seen. Thoracolumbar spine degenerative changes. IMPRESSION: 1. Significant worsening of airspace opacity throughout the right lung, suspicious for pneumonia. 2. Mild increase in prominence of the interstitial markings and faint airspace opacities on the left, also suspicious for pneumonia. 3. Interval borderline cardiomegaly. This is accentuated by the portable AP technique and poor inspiration. Electronically Signed   By: Beckie Salts M.D.   On: 03/23/2017 14:10      Allergies as of 03/24/2017      Reactions   Hydrocodone-acetaminophen Nausea Only, Nausea And Vomiting      Medication List    STOP  taking these medications   loperamide 2 MG tablet Commonly known as:  IMODIUM A-D   ondansetron 4 MG disintegrating tablet Commonly known as:  ZOFRAN ODT     TAKE these medications   albuterol 108 (90 Base) MCG/ACT inhaler Commonly known as:  PROVENTIL HFA;VENTOLIN HFA Inhale 2 puffs into the lungs every 6 (six) hours as needed for wheezing or shortness of breath.   amoxicillin-clavulanate 875-125 MG tablet Commonly known as:  AUGMENTIN Take 1 tablet by mouth 2 (two) times daily.   cyanocobalamin 2000 MCG tablet Take 1 tablet (2,000 mcg total) by mouth daily. Start taking on:  03/25/2017   feeding supplement (ENSURE ENLIVE) Liqd Take 237 mLs by mouth 2 (two) times daily between meals.   levETIRAcetam 750 MG tablet Commonly known as:  KEPPRA Take 750 mg by mouth 2 (two) times daily.   mometasone-formoterol 200-5 MCG/ACT Aero Commonly known as:  DULERA Inhale 2 puffs into the lungs 2 (two) times daily.   nicotine 21 mg/24hr patch Commonly known as:  NICODERM CQ - dosed in mg/24 hours Place 1 patch (21 mg total) onto the skin daily. Start taking on:  03/25/2017   pantoprazole 40 MG tablet Commonly known as:  PROTONIX Take 1 tablet (40 mg total) by mouth daily.   tamsulosin 0.4 MG Caps capsule Commonly known as:  FLOMAX Take 1 capsule (0.4 mg total) by mouth daily. Start taking on:  03/25/2017            Durable Medical Equipment  (From admission, onward)        Start     Ordered   03/24/17 1122  For home use only DME Dan Humphreys  [facility])  Once    Question:  Patient needs a walker to treat with the following condition  Answer:  Weakness   03/24/17 1122         Management plans discussed with the patient and he is in agreement. Stable for discharge   Patient should follow up with pcp  CODE STATUS:     Code Status Orders  (From admission, onward)        Start     Ordered   03/21/17 1813  Full code  Continuous     03/21/17 1812    Code Status  History    Date Active Date Inactive Code Status Order  ID Comments User Context   03/13/2017 1004 03/14/2017 1346 Full Code [id number]  Enid Baas, MD Inpatient   03/12/2017 1606 03/13/2017 1004 DNR [id number]  Ihor Austin, MD Inpatient   03/04/2017 1923 03/04/2017 2355 DNR [id number]  Alford Highland, MD ED      TOTAL TIME TAKING CARE OF THIS PATIENT: 38 minutes.    Note: This dictation was prepared with Dragon dictation along with smaller phrase technology. Any transcriptional errors that result from this process are unintentional.  Khya Halls M.D on 03/24/2017 at 11:25 AM  Between 7am to 6pm - Pager - [phone] After 6pm go to [email or web address] - Social research officer, government  [facility]  Office  [phone]  CC: Primary care physician; Marco Mina, MD

## 2017-03-24 NOTE — Progress Notes (Signed)
Pharmacy Electrolyte Monitoring Consult:  Pharmacy consulted to assist in monitoring and replacing electrolytes in this 62 y.o. male admitted on 03/21/2017 with Shortness of Breath   Labs:  Sodium (mmol/L)  Date Value  03/24/2017 136  11/11/2013 138   Potassium (mmol/L)  Date Value  03/24/2017 4.3  11/11/2013 4.1   Magnesium (mg/dL)  Date Value  16/10/960403/24/2019 2.0   Phosphorus (mg/dL)  Date Value  54/09/811903/24/2019 3.7   Calcium (mg/dL)  Date Value  14/78/295603/24/2019 8.4 (L)   Calcium, Total (mg/dL)  Date Value  21/30/865711/11/2013 8.5   Albumin (g/dL)  Date Value  84/69/629503/21/2019 2.6 (L)  12/17/2012 3.4    Plan: Electrolytes WNL.  Recheck with am labs.   Stormy CardKatsoudas,Fany Cavanaugh K, [facility]PH 03/24/2017 7:48 AM

## 2017-03-24 NOTE — Care Management (Addendum)
Discharge to home today per Dr. Juliene PinaMody. Requested help with medications especially albuterol inhaler. (Mr. Marco [name] hasn't been getting this inhaler the 3 days that he was here in the hospital). Requested hard copies of all discharge medications from Dr. Juliene PinaMody. Also, will give Medication Management application to him. Physical therapy evaluation completed. Recommending home with home health/physical therapy. [facility] will follow Gwenette GreetBrenda S Elmina Hendel RN MSN CCM Care Management [phone]

## 2017-03-24 NOTE — Progress Notes (Signed)
Off nasal O2 for 30 min, his SpO2 is 99% on RA.  Will leave O2 off.

## 2017-03-25 ENCOUNTER — Telehealth: Payer: Self-pay | Admitting: Gastroenterology

## 2017-03-25 ENCOUNTER — Emergency Department: Payer: Medicare Other

## 2017-03-25 ENCOUNTER — Inpatient Hospital Stay
Admission: EM | Admit: 2017-03-25 | Discharge: 2017-04-01 | DRG: 871 | Disposition: A | Discharge status: Skilled Nursing Facility | Payer: Medicare Other | Attending: Internal Medicine | Admitting: Internal Medicine

## 2017-03-25 ENCOUNTER — Encounter: Payer: Self-pay | Admitting: Emergency Medicine

## 2017-03-25 DIAGNOSIS — E876 Hypokalemia: Secondary | ICD-10-CM | POA: Diagnosis not present

## 2017-03-25 DIAGNOSIS — Z79899 Other long term (current) drug therapy: Secondary | ICD-10-CM

## 2017-03-25 DIAGNOSIS — F17210 Nicotine dependence, cigarettes, uncomplicated: Secondary | ICD-10-CM | POA: Diagnosis present

## 2017-03-25 DIAGNOSIS — R0600 Dyspnea, unspecified: Secondary | ICD-10-CM

## 2017-03-25 DIAGNOSIS — F1021 Alcohol dependence, in remission: Secondary | ICD-10-CM | POA: Diagnosis present

## 2017-03-25 DIAGNOSIS — I10 Essential (primary) hypertension: Secondary | ICD-10-CM | POA: Diagnosis present

## 2017-03-25 DIAGNOSIS — J189 Pneumonia, unspecified organism: Secondary | ICD-10-CM | POA: Diagnosis present

## 2017-03-25 DIAGNOSIS — Y95 Nosocomial condition: Secondary | ICD-10-CM | POA: Diagnosis present

## 2017-03-25 DIAGNOSIS — R0602 Shortness of breath: Secondary | ICD-10-CM | POA: Diagnosis not present

## 2017-03-25 DIAGNOSIS — Z23 Encounter for immunization: Secondary | ICD-10-CM

## 2017-03-25 DIAGNOSIS — Z803 Family history of malignant neoplasm of breast: Secondary | ICD-10-CM

## 2017-03-25 DIAGNOSIS — Z833 Family history of diabetes mellitus: Secondary | ICD-10-CM

## 2017-03-25 DIAGNOSIS — Z6826 Body mass index (BMI) 26.0-26.9, adult: Secondary | ICD-10-CM

## 2017-03-25 DIAGNOSIS — N400 Enlarged prostate without lower urinary tract symptoms: Secondary | ICD-10-CM | POA: Diagnosis present

## 2017-03-25 DIAGNOSIS — J80 Acute respiratory distress syndrome: Secondary | ICD-10-CM | POA: Diagnosis present

## 2017-03-25 DIAGNOSIS — A419 Sepsis, unspecified organism: Secondary | ICD-10-CM | POA: Diagnosis not present

## 2017-03-25 DIAGNOSIS — Z885 Allergy status to narcotic agent status: Secondary | ICD-10-CM

## 2017-03-25 DIAGNOSIS — M1990 Unspecified osteoarthritis, unspecified site: Secondary | ICD-10-CM | POA: Diagnosis present

## 2017-03-25 DIAGNOSIS — G40909 Epilepsy, unspecified, not intractable, without status epilepticus: Secondary | ICD-10-CM | POA: Diagnosis present

## 2017-03-25 DIAGNOSIS — E440 Moderate protein-calorie malnutrition: Secondary | ICD-10-CM | POA: Diagnosis present

## 2017-03-25 LAB — CBC
HCT: 32.4 % — ABNORMAL LOW (ref 40.0–52.0)
Hemoglobin: 10.7 g/dL — ABNORMAL LOW (ref 13.0–18.0)
MCH: 33 pg (ref 26.0–34.0)
MCHC: 33.1 g/dL (ref 32.0–36.0)
MCV: 99.6 fL (ref 80.0–100.0)
PLATELETS: 281 10*3/uL (ref 150–440)
RBC: 3.25 MIL/uL — ABNORMAL LOW (ref 4.40–5.90)
RDW: 14.6 % — ABNORMAL HIGH (ref 11.5–14.5)
WBC: 11.6 10*3/uL — AB (ref 3.8–10.6)

## 2017-03-25 LAB — TROPONIN I: Troponin I: 0.03 ng/mL (ref <0.03)

## 2017-03-25 LAB — BASIC METABOLIC PANEL
Anion gap: 9 (ref 5–15)
BUN: 16 mg/dL (ref 6–20)
CO2: 21 mmol/L — ABNORMAL LOW (ref 22–32)
CREATININE: 0.83 mg/dL (ref 0.61–1.24)
Calcium: 8.8 mg/dL — ABNORMAL LOW (ref 8.9–10.3)
Chloride: 107 mmol/L (ref 101–111)
GFR calc Af Amer: >60 mL/min (ref >60)
Glucose, Bld: 97 mg/dL (ref 65–99)
POTASSIUM: 4.2 mmol/L (ref 3.5–5.1)
SODIUM: 137 mmol/L (ref 135–145)

## 2017-03-25 LAB — LEVETIRACETAM LEVEL: LEVETIRACETAM: NOT DETECTED ug/mL (ref 10.0–40.0)

## 2017-03-25 MED ORDER — SODIUM CHLORIDE 0.9 % IV SOLN: 3.0000 g | Freq: Four times a day (QID) | INTRAVENOUS | Status: DC

## 2017-03-25 MED ORDER — SODIUM CHLORIDE 0.9 % IV BOLUS
1000.0000 mL | Freq: Once | INTRAVENOUS | Status: AC
  Administered 2017-03-26: 1000 mL via INTRAVENOUS

## 2017-03-25 MED ORDER — SODIUM CHLORIDE 0.9 % IV BOLUS
1000.0000 mL | Freq: Once | INTRAVENOUS | Status: AC
  Administered 2017-03-25: 1000 mL via INTRAVENOUS

## 2017-03-25 NOTE — ED Notes (Signed)
Pt asleep, wife asleep. No distress noted.

## 2017-03-25 NOTE — ED Provider Notes (Signed)
[facility] Department Provider Note   ____________________________________________   First MD Initiated Contact with Patient 03/25/17 2102     (approximate)  I have reviewed the triage vital signs and the nursing notes.   HISTORY  Chief Complaint Shortness of Breath    HPI Lex Linhares is a 62 y.o. male Who was discharged from the hospital yesterday with healthcare associated pneumonia and sepsis as his diagnosis. He was discharged on Augmentin and was postictal at the antibiotics filled and take them after discharge but his wife took him home and was very sleepy and fell asleep and didn't wake up until after the pharmacies were closed. He seems to be more short of breath so she brought him in again. She reports she's confused and just lays in bed all day long. He's been confused since before the pneumonia however. Here he does seem to be short of breath. But his O2 saturations are above 93. they do not go below that when he walks either. His heart rate does go up to the 120s when he walks however.patient says he feels okay.  Past Medical History:  Diagnosis Date  . Anxiety   . Arthritis   . Depression   . Heartburn   . Seizures [facility])     Patient Active Problem List   Diagnosis Date Noted  . Malnutrition of moderate degree 03/22/2017  . Sepsis (HCC) 03/21/2017  . Hyponatremia 03/12/2017  . Acute encephalopathy 03/04/2017    Past Surgical History:  Procedure Laterality Date  . KNEE ARTHROSCOPY  1975    Prior to Admission medications   Medication Sig Start Date End Date Taking? Authorizing Provider  albuterol (PROVENTIL HFA;VENTOLIN HFA) 108 (90 Base) MCG/ACT inhaler Inhale 2 puffs into the lungs every 6 (six) hours as needed for wheezing or shortness of breath. 03/24/17   Katha Hamming, MD  amoxicillin-clavulanate (AUGMENTIN) 875-125 MG tablet Take 1 tablet by mouth 2 (two) times daily. 03/24/17   Katha Hamming, MD    cyanocobalamin 2000 MCG tablet Take 1 tablet (2,000 mcg total) by mouth daily. 03/25/17   Katha Hamming, MD  feeding supplement, ENSURE ENLIVE, (ENSURE ENLIVE) LIQD Take 237 mLs by mouth 2 (two) times daily between meals. 03/24/17   Katha Hamming, MD  levETIRAcetam (KEPPRA) 750 MG tablet Take 1 tablet (750 mg total) by mouth 2 (two) times daily. 03/24/17   Katha Hamming, MD  mometasone-formoterol (DULERA) 200-5 MCG/ACT AERO Inhale 2 puffs into the lungs 2 (two) times daily. Patient not taking: Reported on 03/21/2017 03/14/17   Enid Baas, MD  nicotine (NICODERM CQ - DOSED IN MG/24 HOURS) 21 mg/24hr patch Place 1 patch (21 mg total) onto the skin daily. 03/25/17   Adrian Saran, MD  pantoprazole (PROTONIX) 40 MG tablet Take 1 tablet (40 mg total) by mouth daily. Patient not taking: Reported on 03/21/2017 03/15/17   Enid Baas, MD  tamsulosin (FLOMAX) 0.4 MG CAPS capsule Take 1 capsule (0.4 mg total) by mouth daily. 03/25/17   Katha Hamming, MD    Allergies Hydrocodone-acetaminophen  Family History  Problem Relation Age of Onset  . Diabetes Mellitus II Mother   . Breast cancer Mother   . Kidney disease Neg Hx   . Prostate cancer Neg Hx     Social History Social History   Tobacco Use  . Smoking status: Current Every Day Smoker    Packs/day: 1.00  . Smokeless tobacco: Never Used  Substance Use Topics  . Alcohol use: Yes  Alcohol/week: 2.4 oz    Types: 4 Cans of beer per week  . Drug use: No    Review of Systems  Constitutional: No fever/chills Eyes: No visual changes. ENT: No sore throat. Cardiovascular: Denies chest pain. Respiratory:  shortness of breath. Gastrointestinal: No abdominal pain.  No nausea, no vomiting.  No diarrhea.  No constipation. Genitourinary: Negative for dysuria. Musculoskeletal: Negative for back pain. Skin: Negative for rash. Neurological: Negative for headaches, focal weakness    ____________________________________________   PHYSICAL EXAM:  VITAL SIGNS: ED Triage Vitals  Enc Vitals Group     BP 03/25/17 2029 (!) 172/90     Pulse Rate 03/25/17 2029 (!) 108     Resp 03/25/17 2029 (!) 24     Temp 03/25/17 2029 98.2 F (36.8 C)     Temp Source 03/25/17 2029 Oral     SpO2 03/25/17 2029 94 %     Weight 03/25/17 2030 160 lb (72.6 kg)     Height 03/25/17 2030 5\' 5"  (1.651 m)     Head Circumference --      Peak Flow --      Pain Score 03/25/17 2030 0     Pain Loc --      Pain Edu? --      Excl. in GC? --    l: Alert  Well appearing and in no acute distressbut he is breathing fast. Eyes: Conjunctivae are normal.  Head: Atraumatic. Nose: No congestion/rhinnorhea. Mouth/Throat: Mucous membranes are moist.  Oropharynx non-erythematous. Neck: No stridor. Cardiovascular: Normal rate, regular rhythm. Grossly normal heart sounds.  Good peripheral circulation. Respiratory: and somewhat increased respiratory effort.  No retractions. Lungs scattered crackles Gastrointestinal: Soft and nontender. No distention. No abdominal bruits. No CVA tenderness. Musculoskeletal: No lower extremity tenderness nor edema.  No joint effusions. Neurologic:  Normal speech and language. No gross focal neurologic deficits are appreciated. No gait instability. Skin:  Skin is warm, dry and intact. No rash noted. Psychiatric: Mood and affect are normal. Speech and behavior are normal.  ____________________________________________   LABS (all labs ordered are listed, but only abnormal results are displayed)  Labs Reviewed  BASIC METABOLIC PANEL - Abnormal; Notable for the following components:      Result Value   CO2 21 (*)    Calcium 8.8 (*)    All other components within normal limits  CBC - Abnormal; Notable for the following components:   WBC 11.6 (*)    RBC 3.25 (*)    Hemoglobin 10.7 (*)    HCT 32.4 (*)    RDW 14.6 (*)    All other components within normal limits   TROPONIN I - Abnormal; Notable for the following components:   Troponin I 0.03 (*)    All other components within normal limits  TROPONIN I   ____________________________________________  EKG  EKG read and interpreted by me shows sinus tachycardia rate of 1:15 left axis no acute ST-T wave changes ____________________________________________  RADIOLOGY  ED MD interpretation:  chest x-ray reviewed by me just barely possible that the pneumonia looks worse than his initial chest x-ray  Official radiology report(s): Dg Chest 2 View  Result Date: 03/25/2017 CLINICAL DATA:  Shortness of Breath EXAM: CHEST - 2 VIEW COMPARISON:  03/23/2017 FINDINGS: Small bilateral pleural effusions. Patchy bilateral airspace opacities are similar to prior study, compatible with pneumonia. Heart is normal size. IMPRESSION: Bilateral airspace opacities, right greater than left are similar prior study, compatible with multifocal pneumonia. Small bilateral effusions. Electronically  Signed   By: Charlett NoseKevin  Dover M.D.   On: 03/25/2017 20:56    ____________________________________________   PROCEDURES  Procedure(s) performed:   Procedures  Critical Care performed:   ____________________________________________   INITIAL IMPRESSION / ASSESSMENT AND PLAN / ED COURSE  will give the patient some IV Unasyn so he can have his antibiotics for today. I will give him some fluids in an effort to see if his heart rate will go down if it does not come down or his oxygen saturations dropped considered admission otherwise his heart rate does go down his O2 sats stayed okay and his wife can get his Augmentin in the morning he should be well to go home. I will sign the patient after Dr. Lamont Snowballifenbark         ____________________________________________   FINAL CLINICAL IMPRESSION(S) / ED DIAGNOSES  Final diagnoses:  Dyspnea, unspecified type  HCAP (healthcare-associated pneumonia)     ED Discharge Orders     None       Note:  This document was prepared using Dragon voice recognition software and may include unintentional dictation errors.    Arnaldo NatalMalinda, Kamaiya Antilla F, MD 03/25/17 [phone]

## 2017-03-25 NOTE — Telephone Encounter (Signed)
LVM, per Dr. Maximino Greenlandahiliani, patient needs to follow up with his GI Dr. Norma Fredricksonoledo as recent post hospital discharge.

## 2017-03-25 NOTE — Progress Notes (Addendum)
Pharmacy Antibiotic Note  Marco RegisterMichael [name] is a 62 y.o. male admitted on 03/25/2017 with pneumonia.  Pharmacy has been consulted for Unasyn dosing.  Plan: Unasyn 3 grams q 6 hours ordered.  Changed to vancomycin and cefepime by EDP.  DW 73kg  Vd 51L kei 0.072 hr-1  T1/2 10 hours  Vancomycin 1 gram q 12 hours ordered with stacked dosing. Level before 5th dose. Goal trough 15-20  Cefepime 2 grams q 8 hours ordered.  Height: 5\' 5"  (165.1 cm) Weight: 160 lb (72.6 kg) IBW/kg (Calculated) : 61.5  Temp (24hrs), Avg:98.2 F (36.8 C), Min:98.2 F (36.8 C), Max:98.2 F (36.8 C)  Recent Labs  Lab 03/21/17 1338 03/21/17 1645 03/22/17 0408 03/23/17 0733 03/24/17 0612 03/25/17 2024  WBC 15.4*  --  9.2  --   --  11.6*  CREATININE 1.08  --  0.98 0.97 0.91 0.83  LATICACIDVEN 3.6* 3.5*  --   --   --   --     Estimated Creatinine Clearance: 81.3 mL/min (by C-G formula based on SCr of 0.83 mg/dL).    Allergies  Allergen Reactions  . Hydrocodone-Acetaminophen Nausea Only and Nausea And Vomiting    Antimicrobials this admission: Unasyn 3/26  >> vancomycin, cefepime 3/26   >>   Dose adjustments this admission:   Microbiology results: 3/21 BCx: NG x 4 days 3/21 MRSA PCR: (-)      3/25 CXR: bilateral opacities R>L  Thank you for allowing pharmacy to be a part of this patient's care.  Marco [name] S 03/25/2017 11:35 PM

## 2017-03-25 NOTE — ED Notes (Signed)
Pt was ambulated using a walker. Pt oxygen dropped to 93%, HR went up to 126. Ambulated to doorway and back to stretcher.

## 2017-03-25 NOTE — ED Notes (Signed)
Pt denies pain. States SOB. States he feels bad. Was DC this AM, pt answers "I don't know" when asked why he was admitted, why he has a foley, did he receive any antibiotics. Pt states that the 2 albuterol treatments given by EMS did not help him.

## 2017-03-25 NOTE — ED Triage Notes (Signed)
Pt brought in by EMS from home after being released from the hospital today. Pt was admitted last week for shob pt unsure of dx, pt also has indwelling catheter in place that was put in during admission but unsure o reason.

## 2017-03-25 NOTE — ED Notes (Signed)
Date and time results received: 03/25/17 2110   Test: troponin Critical Value: 0.03  Name of Provider Notified: Dr. Darnelle CatalanMalinda

## 2017-03-25 NOTE — ED Notes (Signed)
Pt taken to xray via stretcher  

## 2017-03-26 ENCOUNTER — Encounter: Payer: Self-pay | Admitting: Radiology

## 2017-03-26 ENCOUNTER — Inpatient Hospital Stay
Admit: 2017-03-26 | Discharge: 2017-03-26 | Disposition: A | Discharge status: Home / Self Care | Payer: Medicare Other | Attending: Internal Medicine | Admitting: Internal Medicine

## 2017-03-26 ENCOUNTER — Inpatient Hospital Stay: Payer: Medicare Other

## 2017-03-26 ENCOUNTER — Other Ambulatory Visit: Payer: Self-pay

## 2017-03-26 DIAGNOSIS — F1021 Alcohol dependence, in remission: Secondary | ICD-10-CM | POA: Diagnosis present

## 2017-03-26 DIAGNOSIS — R0602 Shortness of breath: Secondary | ICD-10-CM | POA: Diagnosis present

## 2017-03-26 DIAGNOSIS — Z833 Family history of diabetes mellitus: Secondary | ICD-10-CM | POA: Diagnosis not present

## 2017-03-26 DIAGNOSIS — J189 Pneumonia, unspecified organism: Secondary | ICD-10-CM | POA: Diagnosis present

## 2017-03-26 DIAGNOSIS — F17219 Nicotine dependence, cigarettes, with unspecified nicotine-induced disorders: Secondary | ICD-10-CM | POA: Diagnosis not present

## 2017-03-26 DIAGNOSIS — J181 Lobar pneumonia, unspecified organism: Secondary | ICD-10-CM | POA: Diagnosis not present

## 2017-03-26 DIAGNOSIS — A419 Sepsis, unspecified organism: Secondary | ICD-10-CM | POA: Diagnosis present

## 2017-03-26 DIAGNOSIS — I10 Essential (primary) hypertension: Secondary | ICD-10-CM

## 2017-03-26 DIAGNOSIS — M1990 Unspecified osteoarthritis, unspecified site: Secondary | ICD-10-CM | POA: Diagnosis present

## 2017-03-26 DIAGNOSIS — Z79899 Other long term (current) drug therapy: Secondary | ICD-10-CM | POA: Diagnosis not present

## 2017-03-26 DIAGNOSIS — Z6826 Body mass index (BMI) 26.0-26.9, adult: Secondary | ICD-10-CM | POA: Diagnosis not present

## 2017-03-26 DIAGNOSIS — J9601 Acute respiratory failure with hypoxia: Secondary | ICD-10-CM | POA: Diagnosis not present

## 2017-03-26 DIAGNOSIS — Y95 Nosocomial condition: Secondary | ICD-10-CM | POA: Diagnosis not present

## 2017-03-26 DIAGNOSIS — Z803 Family history of malignant neoplasm of breast: Secondary | ICD-10-CM | POA: Diagnosis not present

## 2017-03-26 DIAGNOSIS — F17210 Nicotine dependence, cigarettes, uncomplicated: Secondary | ICD-10-CM | POA: Diagnosis present

## 2017-03-26 DIAGNOSIS — Z23 Encounter for immunization: Secondary | ICD-10-CM | POA: Diagnosis present

## 2017-03-26 DIAGNOSIS — J80 Acute respiratory distress syndrome: Secondary | ICD-10-CM | POA: Diagnosis present

## 2017-03-26 DIAGNOSIS — N400 Enlarged prostate without lower urinary tract symptoms: Secondary | ICD-10-CM | POA: Diagnosis present

## 2017-03-26 DIAGNOSIS — G40909 Epilepsy, unspecified, not intractable, without status epilepticus: Secondary | ICD-10-CM | POA: Diagnosis present

## 2017-03-26 DIAGNOSIS — Z885 Allergy status to narcotic agent status: Secondary | ICD-10-CM | POA: Diagnosis not present

## 2017-03-26 DIAGNOSIS — E440 Moderate protein-calorie malnutrition: Secondary | ICD-10-CM | POA: Diagnosis present

## 2017-03-26 DIAGNOSIS — E876 Hypokalemia: Secondary | ICD-10-CM | POA: Diagnosis not present

## 2017-03-26 LAB — PROTIME-INR
INR: 1.18
Prothrombin Time: 14.9 seconds (ref 11.4–15.2)

## 2017-03-26 LAB — GLUCOSE, CAPILLARY: Glucose-Capillary: 120 mg/dL — ABNORMAL HIGH (ref 65–99)

## 2017-03-26 LAB — HEPATIC FUNCTION PANEL
ALT: 36 U/L (ref 17–63)
AST: 108 U/L — ABNORMAL HIGH (ref 15–41)
Albumin: 2.1 g/dL — ABNORMAL LOW (ref 3.5–5.0)
Alkaline Phosphatase: 212 U/L — ABNORMAL HIGH (ref 38–126)
Bilirubin, Direct: 0.3 mg/dL (ref 0.1–0.5)
Indirect Bilirubin: 1.1 mg/dL — ABNORMAL HIGH (ref 0.3–0.9)
Total Bilirubin: 1.4 mg/dL — ABNORMAL HIGH (ref 0.3–1.2)
Total Protein: 5.6 g/dL — ABNORMAL LOW (ref 6.5–8.1)

## 2017-03-26 LAB — PROCALCITONIN: Procalcitonin: 0.36 ng/mL

## 2017-03-26 LAB — ECHOCARDIOGRAM COMPLETE
Height: 66 in
WEIGHTICAEL: 2617.3 [oz_av]

## 2017-03-26 LAB — CULTURE, BLOOD (ROUTINE X 2)
CULTURE: NO GROWTH
Culture: NO GROWTH
Special Requests: ADEQUATE

## 2017-03-26 LAB — TSH: TSH: 2.056 u[IU]/mL (ref 0.350–4.500)

## 2017-03-26 LAB — MRSA PCR SCREENING: MRSA by PCR: NEGATIVE

## 2017-03-26 MED ORDER — METHYLPREDNISOLONE SODIUM SUCC 125 MG IJ SOLR
125.0000 mg | Freq: Once | INTRAMUSCULAR | Status: AC
  Administered 2017-03-26: 125 mg via INTRAVENOUS
  Filled 2017-03-26: qty 2

## 2017-03-26 MED ORDER — SODIUM CHLORIDE 0.9 % IV SOLN
2.0000 g | Freq: Once | INTRAVENOUS | Status: AC
  Administered 2017-03-26: 2 g via INTRAVENOUS
  Filled 2017-03-26: qty 2

## 2017-03-26 MED ORDER — ONDANSETRON HCL 4 MG/2ML IJ SOLN
INTRAMUSCULAR | Status: AC
  Filled 2017-03-26: qty 2

## 2017-03-26 MED ORDER — OCUVITE-LUTEIN PO CAPS
1.0000 | ORAL_CAPSULE | Freq: Every day | ORAL | Status: DC
  Administered 2017-03-27 – 2017-04-01 (×6): 1 via ORAL
  Filled 2017-03-26 (×7): qty 1

## 2017-03-26 MED ORDER — LABETALOL HCL 5 MG/ML IV SOLN
5.0000 mg | INTRAVENOUS | Status: DC | PRN
  Administered 2017-03-26: 10 mg via INTRAVENOUS
  Filled 2017-03-26: qty 4

## 2017-03-26 MED ORDER — NITROGLYCERIN 2 % TD OINT
TOPICAL_OINTMENT | TRANSDERMAL | Status: AC
  Filled 2017-03-26: qty 1

## 2017-03-26 MED ORDER — IPRATROPIUM-ALBUTEROL 0.5-2.5 (3) MG/3ML IN SOLN
3.0000 mL | RESPIRATORY_TRACT | Status: DC
  Administered 2017-03-26 – 2017-04-01 (×35): 3 mL via RESPIRATORY_TRACT
  Filled 2017-03-26 (×38): qty 3

## 2017-03-26 MED ORDER — METHYLPREDNISOLONE SODIUM SUCC 40 MG IJ SOLR
40.0000 mg | Freq: Four times a day (QID) | INTRAMUSCULAR | Status: DC
  Administered 2017-03-26 – 2017-04-01 (×23): 40 mg via INTRAVENOUS
  Filled 2017-03-26 (×23): qty 1

## 2017-03-26 MED ORDER — ONDANSETRON HCL 4 MG/2ML IJ SOLN
4.0000 mg | Freq: Four times a day (QID) | INTRAMUSCULAR | Status: DC | PRN
  Administered 2017-03-26: 4 mg via INTRAVENOUS
  Filled 2017-03-26: qty 2

## 2017-03-26 MED ORDER — NICOTINE 21 MG/24HR TD PT24
21.0000 mg | MEDICATED_PATCH | Freq: Every day | TRANSDERMAL | Status: DC
  Administered 2017-03-26 – 2017-04-01 (×7): 21 mg via TRANSDERMAL
  Filled 2017-03-26 (×7): qty 1

## 2017-03-26 MED ORDER — ALBUTEROL SULFATE (2.5 MG/3ML) 0.083% IN NEBU
3.0000 mL | INHALATION_SOLUTION | Freq: Four times a day (QID) | RESPIRATORY_TRACT | Status: DC | PRN
  Administered 2017-03-26 – 2017-03-27 (×3): 3 mL via RESPIRATORY_TRACT
  Filled 2017-03-26 (×2): qty 3

## 2017-03-26 MED ORDER — DOCUSATE SODIUM 100 MG PO CAPS
100.0000 mg | ORAL_CAPSULE | Freq: Two times a day (BID) | ORAL | Status: DC
  Administered 2017-03-26 – 2017-04-01 (×11): 100 mg via ORAL
  Filled 2017-03-26 (×11): qty 1

## 2017-03-26 MED ORDER — FOLIC ACID 1 MG PO TABS
1.0000 mg | ORAL_TABLET | Freq: Every day | ORAL | Status: DC
  Administered 2017-03-27 – 2017-04-01 (×6): 1 mg via ORAL
  Filled 2017-03-26 (×7): qty 1

## 2017-03-26 MED ORDER — SODIUM CHLORIDE 0.9 % IV SOLN
2.0000 g | Freq: Three times a day (TID) | INTRAVENOUS | Status: DC
  Administered 2017-03-26 – 2017-03-29 (×10): 2 g via INTRAVENOUS
  Filled 2017-03-26 (×12): qty 2

## 2017-03-26 MED ORDER — ONDANSETRON HCL 4 MG/2ML IJ SOLN
4.0000 mg | Freq: Once | INTRAMUSCULAR | Status: AC
  Administered 2017-03-26: 4 mg via INTRAVENOUS

## 2017-03-26 MED ORDER — VITAMIN B-1 100 MG PO TABS
100.0000 mg | ORAL_TABLET | Freq: Every day | ORAL | Status: DC
  Administered 2017-03-27 – 2017-04-01 (×6): 100 mg via ORAL
  Filled 2017-03-26 (×6): qty 1

## 2017-03-26 MED ORDER — ENSURE ENLIVE PO LIQD
237.0000 mL | Freq: Two times a day (BID) | ORAL | Status: DC
  Administered 2017-03-27: 237 mL via ORAL

## 2017-03-26 MED ORDER — VANCOMYCIN HCL IN DEXTROSE 1-5 GM/200ML-% IV SOLN
1000.0000 mg | Freq: Once | INTRAVENOUS | Status: AC
  Administered 2017-03-26: 1000 mg via INTRAVENOUS
  Filled 2017-03-26: qty 200

## 2017-03-26 MED ORDER — LEVETIRACETAM 750 MG PO TABS
750.0000 mg | ORAL_TABLET | Freq: Two times a day (BID) | ORAL | Status: DC
  Administered 2017-03-26 – 2017-03-27 (×3): 750 mg via ORAL
  Filled 2017-03-26 (×4): qty 1

## 2017-03-26 MED ORDER — ENOXAPARIN SODIUM 40 MG/0.4ML ~~LOC~~ SOLN
40.0000 mg | SUBCUTANEOUS | Status: DC
  Administered 2017-03-26 – 2017-03-31 (×6): 40 mg via SUBCUTANEOUS
  Filled 2017-03-26 (×6): qty 0.4

## 2017-03-26 MED ORDER — ACETAMINOPHEN 650 MG RE SUPP: 650.0000 mg | Freq: Four times a day (QID) | RECTAL | Status: DC | PRN

## 2017-03-26 MED ORDER — FUROSEMIDE 10 MG/ML IJ SOLN
INTRAMUSCULAR | Status: AC
  Administered 2017-03-26: 40 mg via INTRAVENOUS
  Filled 2017-03-26: qty 4

## 2017-03-26 MED ORDER — VITAMIN B-12 1000 MCG PO TABS
2000.0000 ug | ORAL_TABLET | Freq: Every day | ORAL | Status: DC
  Administered 2017-03-27 – 2017-04-01 (×6): 2000 ug via ORAL
  Filled 2017-03-26 (×6): qty 2

## 2017-03-26 MED ORDER — VANCOMYCIN HCL IN DEXTROSE 1-5 GM/200ML-% IV SOLN
1000.0000 mg | Freq: Two times a day (BID) | INTRAVENOUS | Status: DC
  Administered 2017-03-26: 1000 mg via INTRAVENOUS
  Filled 2017-03-26 (×2): qty 200

## 2017-03-26 MED ORDER — FUROSEMIDE 10 MG/ML IJ SOLN
40.0000 mg | Freq: Once | INTRAMUSCULAR | Status: AC
  Administered 2017-03-26: 40 mg via INTRAVENOUS

## 2017-03-26 MED ORDER — ONDANSETRON HCL 4 MG PO TABS
4.0000 mg | ORAL_TABLET | Freq: Four times a day (QID) | ORAL | Status: DC | PRN
Start: 2017-03-26 — End: 2017-04-01

## 2017-03-26 MED ORDER — HALOPERIDOL LACTATE 5 MG/ML IJ SOLN
5.0000 mg | Freq: Once | INTRAMUSCULAR | Status: AC
  Administered 2017-03-26: 5 mg via INTRAVENOUS
  Filled 2017-03-26: qty 1

## 2017-03-26 MED ORDER — DIPHENHYDRAMINE HCL 50 MG/ML IJ SOLN
50.0000 mg | Freq: Once | INTRAMUSCULAR | Status: AC
  Administered 2017-03-26: 50 mg via INTRAVENOUS

## 2017-03-26 MED ORDER — MOMETASONE FURO-FORMOTEROL FUM 200-5 MCG/ACT IN AERO
2.0000 | INHALATION_SPRAY | Freq: Two times a day (BID) | RESPIRATORY_TRACT | Status: DC
  Administered 2017-03-26 – 2017-03-27 (×3): 2 via RESPIRATORY_TRACT
  Filled 2017-03-26: qty 8.8

## 2017-03-26 MED ORDER — FUROSEMIDE 10 MG/ML IJ SOLN
40.0000 mg | Freq: Two times a day (BID) | INTRAMUSCULAR | Status: DC
  Administered 2017-03-26 – 2017-04-01 (×12): 40 mg via INTRAVENOUS
  Filled 2017-03-26 (×12): qty 4

## 2017-03-26 MED ORDER — ACETAMINOPHEN 325 MG PO TABS
650.0000 mg | ORAL_TABLET | Freq: Four times a day (QID) | ORAL | Status: DC | PRN
Start: 2017-03-26 — End: 2017-04-01
  Administered 2017-03-31: 650 mg via ORAL
  Filled 2017-03-26: qty 2

## 2017-03-26 MED ORDER — IOPAMIDOL (ISOVUE-370) INJECTION 76%
100.0000 mL | Freq: Once | INTRAVENOUS | Status: AC | PRN
  Administered 2017-03-26: 100 mL via INTRAVENOUS

## 2017-03-26 MED ORDER — TAMSULOSIN HCL 0.4 MG PO CAPS
0.4000 mg | ORAL_CAPSULE | Freq: Every day | ORAL | Status: DC
  Administered 2017-03-27 – 2017-04-01 (×6): 0.4 mg via ORAL
  Filled 2017-03-26 (×6): qty 1

## 2017-03-26 MED ORDER — SODIUM CHLORIDE 0.9 % IV SOLN
Freq: Once | INTRAVENOUS | Status: AC
  Administered 2017-03-26: 04:00:00 via INTRAVENOUS

## 2017-03-26 MED ORDER — METOPROLOL TARTRATE 25 MG PO TABS
12.5000 mg | ORAL_TABLET | Freq: Two times a day (BID) | ORAL | Status: DC
  Administered 2017-03-26 – 2017-04-01 (×11): 12.5 mg via ORAL
  Filled 2017-03-26 (×11): qty 1

## 2017-03-26 MED ORDER — SODIUM CHLORIDE 0.9 % IV SOLN
INTRAVENOUS | Status: DC
  Administered 2017-03-26: 07:00:00 via INTRAVENOUS

## 2017-03-26 MED ORDER — PNEUMOCOCCAL VAC POLYVALENT 25 MCG/0.5ML IJ INJ
0.5000 mL | INJECTION | INTRAMUSCULAR | Status: AC
  Administered 2017-03-27: 0.5 mL via INTRAMUSCULAR
  Filled 2017-03-26: qty 0.5

## 2017-03-26 MED ORDER — PANTOPRAZOLE SODIUM 40 MG PO TBEC
40.0000 mg | DELAYED_RELEASE_TABLET | Freq: Every day | ORAL | Status: DC
  Administered 2017-03-27: 40 mg via ORAL
  Filled 2017-03-26: qty 1

## 2017-03-26 MED ORDER — HYDRALAZINE HCL 20 MG/ML IJ SOLN
10.0000 mg | INTRAMUSCULAR | Status: DC | PRN
  Administered 2017-03-26 (×2): 10 mg via INTRAVENOUS
  Filled 2017-03-26 (×2): qty 1

## 2017-03-26 MED ORDER — DIPHENHYDRAMINE HCL 50 MG/ML IJ SOLN
INTRAMUSCULAR | Status: AC
  Filled 2017-03-26: qty 1

## 2017-03-26 MED ORDER — NITROGLYCERIN 2 % TD OINT
0.5000 [in_us] | TOPICAL_OINTMENT | Freq: Once | TRANSDERMAL | Status: AC
  Administered 2017-03-26: 0.5 [in_us] via TOPICAL

## 2017-03-26 NOTE — Consult Note (Signed)
[facility] [place] [facility] Consultation      Name: Marco [name] MRN: [id number] DOB: 10-26-1955    ADMISSION DATE:  03/25/2017 CONSULTATION DATE:  03/26/2017  REFERRING MD :  Arnaldo Natal MD   CHIEF COMPLAINT:     Shortness of breath   HISTORY OF PRESENT ILLNESS   Patient with past medical history of seizure disorder, arthritis and depression/anxiety presents to the emergency department with shortness of breath.  The patient was discharged from the hospital yesterday following treatment for pneumonia.  He was also discharged with an indwelling Foley catheter.  He was unable to purchase his antibiotics following discharge and thus missed 1 dose of his pneumonia treatment.  Upon return to the emergency department the patient rapidly became more agitated and confused.  His oxygen saturations gradually dropped to below 88% requiring 3 L of oxygen via nasal cannula.  He received cefepime and vancomycin in the emergency department but appeared to worsen clinically.  The patient's blood pressure also dramatically elevated to more than 200/100 and heart rate greater than 130.  His respiratory rate also increased to greater than 30.  Nitropaste was applied to his chest and he was given Benadryl due to concern for possible allergic reaction.  The patient's agitation improved and his blood pressure decreased but he remained diaphoretic and short of breath despite good oxygen saturations on supplemental O2.  A CT of his chest and abdomen was obtained to rule out pulmonary embolism or acute intracranial process.  It showed an intralobular thickening and inflammatory pattern along with new pleural effusions bilaterally, right greater than left.  He was therefore admitted to the ICU    SIGNIFICANT EVENTS    Worsening dysnoea this Am requiring NIPPV    PAST MEDICAL HISTORY    :  Past Medical History:  Diagnosis Date  . Anxiety   . Arthritis   . Depression   . Heartburn   .  Seizures (HCC)    Past Surgical History:  Procedure Laterality Date  . KNEE ARTHROSCOPY  1975   Prior to Admission medications   Medication Sig Start Date End Date Taking? Authorizing Provider  albuterol (PROVENTIL HFA;VENTOLIN HFA) 108 (90 Base) MCG/ACT inhaler Inhale 2 puffs into the lungs every 6 (six) hours as needed for wheezing or shortness of breath. 03/24/17  Yes Katha Hamming, MD  amoxicillin-clavulanate (AUGMENTIN) 875-125 MG tablet Take 1 tablet by mouth 2 (two) times daily. 03/24/17  Yes Katha Hamming, MD  cyanocobalamin 2000 MCG tablet Take 1 tablet (2,000 mcg total) by mouth daily. 03/25/17  Yes Katha Hamming, MD  feeding supplement, ENSURE ENLIVE, (ENSURE ENLIVE) LIQD Take 237 mLs by mouth 2 (two) times daily between meals. 03/24/17  Yes Katha Hamming, MD  levETIRAcetam (KEPPRA) 750 MG tablet Take 1 tablet (750 mg total) by mouth 2 (two) times daily. 03/24/17  Yes Katha Hamming, MD  mometasone-formoterol (DULERA) 200-5 MCG/ACT AERO Inhale 2 puffs into the lungs 2 (two) times daily. 03/14/17  Yes Enid Baas, MD  nicotine (NICODERM CQ - DOSED IN MG/24 HOURS) 21 mg/24hr patch Place 1 patch (21 mg total) onto the skin daily. 03/25/17  Yes Mody, Patricia Pesa, MD  pantoprazole (PROTONIX) 40 MG tablet Take 1 tablet (40 mg total) by mouth daily. 03/15/17  Yes Enid Baas, MD  tamsulosin (FLOMAX) 0.4 MG CAPS capsule Take 1 capsule (0.4 mg total) by mouth daily. 03/25/17  Yes Katha Hamming, MD   Allergies  Allergen Reactions  . Hydrocodone-Acetaminophen Nausea Only and Nausea And Vomiting  FAMILY HISTORY   Family History  Problem Relation Age of Onset  . Diabetes Mellitus II Mother   . Breast cancer Mother   . Kidney disease Neg Hx   . Prostate cancer Neg Hx       SOCIAL HISTORY    reports that he has been smoking.  He has been smoking about 1.00 pack per day. He has never used smokeless tobacco. He reports that he drinks about  2.4 oz of alcohol per week. He reports that he does not use drugs.  ROS  Constitutional: Positive for diaphoresis and malaise/fatigue. Negative for chills and fever.  HENT: Negative for sore throat and tinnitus.   Eyes: Negative for blurred vision and redness.  Respiratory: Positive for shortness of breath. Negative for cough.   Cardiovascular: Negative for chest pain, palpitations, orthopnea and PND.  Gastrointestinal: Negative for abdominal pain, diarrhea, nausea and vomiting.  Genitourinary: Negative for dysuria, frequency and urgency.  Musculoskeletal: Negative for joint pain and myalgias.  Skin: Negative for rash.       No lesions  Neurological: Negative for speech change, focal weakness and weakness.  Endo/Heme/Allergies: Does not bruise/bleed easily.       No temperature intolerance  Psychiatric/Behavioral: Negative for depression and suicidal ideas.     VITAL SIGNS    Temp:  [97.4 F (36.3 C)-98.2 F (36.8 C)] 97.4 F (36.3 C) (03/26 1600) Pulse Rate:  [50-136] 112 (03/26 1600) Resp:  [14-36] 29 (03/26 1600) BP: (126-210)/(78-131) 157/79 (03/26 1600) SpO2:  [91 %-100 %] 96 % (03/26 1600) FiO2 (%):  [35 %-50 %] 50 % (03/26 1600) Weight:  [160 lb (72.6 kg)-163 lb 9.3 oz (74.2 kg)] 163 lb 9.3 oz (74.2 kg) (03/26 0630) HEMODYNAMICS:   VENTILATOR SETTINGS: FiO2 (%):  [35 %-50 %] 50 % INTAKE / OUTPUT:  Intake/Output Summary (Last 24 hours) at 03/26/2017 1631 Last data filed at 03/26/2017 1600 Gross per 24 hour  Intake 300 ml  Output 2600 ml  Net -2300 ml       PHYSICAL EXAM       HEENT: Normocephalic and atraumatic.  Mouth/Throat: Oropharynx is clear and moist.  Eyes: Pupils are equal, round, and reactive to light. Conjunctivae and EOM are normal. No scleral icterus.  Neck: Normal range of motion. Neck supple. No JVD present. No tracheal deviation present. No thyromegaly present.  Cardiovascular: Regular rhythm and normal heart sounds. Tachycardia . Exam  reveals no gallop and no friction rub.  No murmur heard. Respiratory: Tachypnea noted. No respiratory distress.  He has rhonchi in the left lower field.  GI: Soft. Bowel sounds are normal. He exhibits no distension. There is no tenderness.  Genitourinary:  Genitourinary Comments: Foley catheter in place  Musculoskeletal: Normal range of motion. He exhibits no edema.  Lymphadenopathy:    He has no cervical adenopathy.  Neurological: He is AAO x 3 with a tremour Oriented to self and surroundings  Skin: Skin is warm. No rash noted.  Psychiatric: He has a normal mood and affect. His speech is normal. Judgment and thought content normal.      LABS   LABS:  CBC Recent Labs  Lab 03/21/17 1338 03/22/17 0408 03/25/17 2024  WBC 15.4* 9.2 11.6*  HGB 15.1 12.4* 10.7*  HCT 45.1 35.9* 32.4*  PLT 259 187 281   Coag's Recent Labs  Lab 03/21/17 1338 03/26/17 0631  INR 1.31 1.18   BMET Recent Labs  Lab 03/23/17 0733 03/24/17 0612 03/25/17 2024  NA 136 136  137  K 3.9 4.3 4.2  CL 113* 112* 107  CO2 18* 18* 21*  BUN 12 15 16   CREATININE 0.97 0.91 0.83  GLUCOSE 83 136* 97   Electrolytes Recent Labs  Lab 03/22/17 1521 03/23/17 0733 03/24/17 0612 03/25/17 2024  CALCIUM  --  8.1* 8.4* 8.8*  MG 1.8 1.9 2.0  --   PHOS  --  2.9 3.7  --    Sepsis Markers Recent Labs  Lab 03/21/17 1338 03/21/17 1645 03/26/17 0631  LATICACIDVEN 3.6* 3.5*  --   PROCALCITON  --   --  0.36   ABG No results for input(s): PHART, PCO2ART, PO2ART in the last 168 hours. Liver Enzymes Recent Labs  Lab 03/21/17 1338 03/26/17 0520  AST 40 108*  ALT 14* 36  ALKPHOS 114 212*  BILITOT 1.8* 1.4*  ALBUMIN 2.6* 2.1*   Cardiac Enzymes Recent Labs  Lab 03/21/17 1338 03/25/17 2024 03/25/17 2221  TROPONINI <0.03 0.03* <0.03   Glucose Recent Labs  Lab 03/26/17 0621  GLUCAP 120*     Recent Results (from the past 240 hour(s))  Culture, blood (Routine x 2)     Status: None   Collection  Time: 03/21/17  1:41 PM  Result Value Ref Range Status   Specimen Description BLOOD RIGHT [facility]  Final   Special Requests   Final    BOTTLES DRAWN AEROBIC AND ANAEROBIC Blood Culture adequate volume   Culture   Final    NO GROWTH 5 DAYS Performed at [facility], [address] [place], [place] [address]    Report Status 03/26/2017 FINAL  Final  Culture, blood (Routine x 2)     Status: None   Collection Time: 03/21/17  1:41 PM  Result Value Ref Range Status   Specimen Description BLOOD  LEFT HAND  Final   Special Requests   Final    BOTTLES DRAWN AEROBIC AND ANAEROBIC Blood Culture results may not be optimal due to an inadequate volume of blood received in culture bottles   Culture   Final    NO GROWTH 5 DAYS Performed at [facility], [address] [place], [place] [address]    Report Status 03/26/2017 FINAL  Final  MRSA PCR Screening     Status: None   Collection Time: 03/21/17  6:21 PM  Result Value Ref Range Status   MRSA by PCR NEGATIVE NEGATIVE Final    Comment:        The GeneXpert MRSA Assay (FDA approved for NASAL specimens only), is one component of a comprehensive MRSA colonization surveillance program. It is not intended to diagnose MRSA infection nor to guide or monitor treatment for MRSA infections. Performed at [facility], [address] [place], [place] [address]   MRSA PCR Screening     Status: None   Collection Time: 03/26/17  6:34 AM  Result Value Ref Range Status   MRSA by PCR NEGATIVE NEGATIVE Final    Comment:        The GeneXpert MRSA Assay (FDA approved for NASAL specimens only), is one component of a comprehensive MRSA colonization surveillance program. It is not intended to diagnose MRSA infection nor to guide or monitor treatment for MRSA infections. Performed at [facility], [address] [place], [place] [address]      Current Facility-Administered Medications:  .  acetaminophen  (TYLENOL) tablet 650 mg, 650 mg, Oral, Q6H PRN **OR** acetaminophen (TYLENOL) suppository 650 mg, 650 mg, Rectal, Q6H PRN, Sheryle Hail,  Kelton PillarMichael S, MD .  albuterol (PROVENTIL) (2.5 MG/3ML) 0.083% nebulizer solution 3 mL, 3 mL, Inhalation, Q6H PRN, Arnaldo Nataliamond, Jaystin S, MD, 3 mL at 03/26/17 16100712 .  ceFEPIme (MAXIPIME) 2 g in sodium chloride 0.9 % 100 mL IVPB, 2 g, Intravenous, Q8H, Arnaldo Nataliamond, Giovonnie S, MD, Stopped at 03/26/17 0945 .  docusate sodium (COLACE) capsule 100 mg, 100 mg, Oral, BID, Arnaldo Nataliamond, Luster S, MD .  enoxaparin (LOVENOX) injection 40 mg, 40 mg, Subcutaneous, Q24H, Arnaldo Nataliamond, Williams S, MD .  feeding supplement (ENSURE ENLIVE) (ENSURE ENLIVE) liquid 237 mL, 237 mL, Oral, BID BM, Arnaldo Nataliamond, Misha S, MD .  folic acid (FOLVITE) tablet 1 mg, 1 mg, Oral, Daily, Jackson Latinoichards, Elza Sortor A, MD .  hydrALAZINE (APRESOLINE) injection 10 mg, 10 mg, Intravenous, Q4H PRN, Karl ItoSommer, Steven E, MD, 10 mg at 03/26/17 1521 .  ipratropium-albuterol (DUONEB) 0.5-2.5 (3) MG/3ML nebulizer solution 3 mL, 3 mL, Nebulization, Q4H, Annett Fabianichards, Finnlee Guarnieri A, MD, 3 mL at 03/26/17 1136 .  labetalol (NORMODYNE,TRANDATE) injection 5-10 mg, 5-10 mg, Intravenous, Q2H PRN, Arnaldo Nataliamond, Plumer S, MD, 10 mg at 03/26/17 1108 .  levETIRAcetam (KEPPRA) tablet 750 mg, 750 mg, Oral, BID, Arnaldo Nataliamond, Jia S, MD .  methylPREDNISolone sodium succinate (SOLU-MEDROL) 40 mg/mL injection 40 mg, 40 mg, Intravenous, Q6H, Annett Fabianichards, Harrie Cazarez A, MD, 40 mg at 03/26/17 1330 .  mometasone-formoterol (DULERA) 200-5 MCG/ACT inhaler 2 puff, 2 puff, Inhalation, BID, Arnaldo Nataliamond, Tanish S, MD .  multivitamin-lutein (OCUVITE-LUTEIN) capsule 1 capsule, 1 capsule, Oral, Daily, Jackson Latinoichards, Felissa Blouch A, MD .  nicotine (NICODERM CQ - dosed in mg/24 hours) patch 21 mg, 21 mg, Transdermal, Daily, Arnaldo Nataliamond, Dairon S, MD, 21 mg at 03/26/17 0853 .  ondansetron (ZOFRAN) tablet 4 mg, 4 mg, Oral, Q6H PRN **OR** ondansetron (ZOFRAN) injection 4 mg, 4 mg, Intravenous, Q6H PRN, Arnaldo Nataliamond, Elridge S, MD .   pantoprazole (PROTONIX) EC tablet 40 mg, 40 mg, Oral, Daily, Arnaldo Nataliamond, Dailan S, MD .  Melene Muller[START ON 03/27/2017] pneumococcal 23 valent vaccine (PNU-IMMUNE) injection 0.5 mL, 0.5 mL, Intramuscular, Tomorrow-1000, Jackson Latinoichards, Kaeley Vinje A, MD .  tamsulosin (FLOMAX) capsule 0.4 mg, 0.4 mg, Oral, Daily, Arnaldo Nataliamond, Jamahl S, MD .  thiamine (VITAMIN B-1) tablet 100 mg, 100 mg, Oral, Daily, Annett Fabianichards, Saylah Ketner A, MD .  vitamin B-12 (CYANOCOBALAMIN) tablet 2,000 mcg, 2,000 mcg, Oral, Daily, Arnaldo Nataliamond, Abenezer S, MD  IMAGING    Dg Chest 2 View  Result Date: 03/25/2017 CLINICAL DATA:  Shortness of Breath EXAM: CHEST - 2 VIEW COMPARISON:  03/23/2017 FINDINGS: Small bilateral pleural effusions. Patchy bilateral airspace opacities are similar to prior study, compatible with pneumonia. Heart is normal size. IMPRESSION: Bilateral airspace opacities, right greater than left are similar prior study, compatible with multifocal pneumonia. Small bilateral effusions. Electronically Signed   By: Charlett NoseKevin  Dover M.D.   On: 03/25/2017 20:56   Ct Angio Chest Pe W/cm &/or Wo Cm  Result Date: 03/26/2017 CLINICAL DATA:  62 year old male with recent discharge from the hospital for pneumonia and sepsis. Worsening of shortness of breath and confusion. EXAM: CT ANGIOGRAPHY CHEST CT ABDOMEN AND PELVIS WITH CONTRAST TECHNIQUE: Multidetector CT imaging of the chest was performed using the standard protocol during bolus administration of intravenous contrast. Multiplanar CT image reconstructions and MIPs were obtained to evaluate the vascular anatomy. Multidetector CT imaging of the abdomen and pelvis was performed using the standard protocol during bolus administration of intravenous contrast. CONTRAST:  100mL ISOVUE-370 IOPAMIDOL (ISOVUE-370) INJECTION 76% COMPARISON:  Chest CT dated 03/04/2017 FINDINGS: CTA CHEST FINDINGS Cardiovascular: There is no cardiomegaly or pericardial  effusion. The thoracic aorta is unremarkable. The origins of the great vessels  of the aortic arch appear patent. There is no CT evidence of pulmonary embolism. Mediastinum/Nodes: Bilateral hilar and mediastinal adenopathy, reactive. Right hilar lymph node measures 13 mm in short axis. The esophagus is grossly unremarkable. No mediastinal fluid collection. Lungs/Pleura: There are extensive bilateral patchy ground-glass opacity and interlobular septal thickening with upper lobe predominant and new compared to the prior CT. Differentials include ARDS, bacterial pneumonia, acute interstitial pneumonia, or pulmonary alveolar proteinosis. Other etiologies are not excluded. Clinical correlation and pulmonary consult is advised. There is moderate bilateral pleural effusions, new since the prior CT with associated partial compressive atelectasis of the lower lobes. No pneumothorax. The central airways are patent. Musculoskeletal: There is diffuse subcutaneous edema and anasarca. The osseous structures are intact. Review of the MIP images confirms the above findings. CT ABDOMEN and PELVIS FINDINGS No intra-abdominal free air.  Small ascites. Hepatobiliary: Indeterminate 15 mm hypodense lesion in the right lobe of the liver similar to prior CT. No intrahepatic biliary ductal dilatation. The gallbladder is unremarkable. Pancreas: Unremarkable. No pancreatic ductal dilatation or surrounding inflammatory changes. Spleen: Normal in size without focal abnormality. Adrenals/Urinary Tract: The adrenal glands, kidneys, and the visualized ureters appear unremarkable. The urinary bladder is decompressed around a Foley catheter. Air within the bladder introduced via catheter. Stomach/Bowel: There is no bowel obstruction or active inflammation. Normal appendix. Vascular/Lymphatic: Mild aortoiliac atherosclerotic disease. No portal venous gas. There is no adenopathy. Reproductive: The prostate and seminal vesicles are grossly unremarkable. Other: Diffuse subcutaneous edema and anasarca. Musculoskeletal: Degenerative  changes of the spine. No acute osseous pathology. Review of the MIP images confirms the above findings. IMPRESSION: 1. Diffuse bilateral airspace density and interlobular septal prominence with a "crazy paving" appearance and new since the prior CT. Differentials include ARDS, bacterial pneumonia, acute interstitial pneumonitis, or pulmonary alveolar proteinosis. Clinical correlation and pulmonary consult is advised. 2. Interval development of moderate size bilateral pleural effusions with associated partial compressive atelectasis of the lower lobes. 3. No CT evidence of pulmonary artery embolus. 4. Small ascites and anasarca. Electronically Signed   By: Elgie Collard M.D.   On: 03/26/2017 05:04   Ct Abdomen Pelvis W Contrast  Result Date: 03/26/2017 CLINICAL DATA:  62 year old male with recent discharge from the hospital for pneumonia and sepsis. Worsening of shortness of breath and confusion. EXAM: CT ANGIOGRAPHY CHEST CT ABDOMEN AND PELVIS WITH CONTRAST TECHNIQUE: Multidetector CT imaging of the chest was performed using the standard protocol during bolus administration of intravenous contrast. Multiplanar CT image reconstructions and MIPs were obtained to evaluate the vascular anatomy. Multidetector CT imaging of the abdomen and pelvis was performed using the standard protocol during bolus administration of intravenous contrast. CONTRAST:  ISOVUE-370 IOPAMIDOL (ISOVUE-370) INJECTION 76% COMPARISON:  Chest CT dated 03/04/2017 FINDINGS: CTA CHEST FINDINGS Cardiovascular: There is no cardiomegaly or pericardial effusion. The thoracic aorta is unremarkable. The origins of the great vessels of the aortic arch appear patent. There is no CT evidence of pulmonary embolism. Mediastinum/Nodes: Bilateral hilar and mediastinal adenopathy, reactive. Right hilar lymph node measures 13 mm in short axis. The esophagus is grossly unremarkable. No mediastinal fluid collection. Lungs/Pleura: There are extensive  bilateral patchy ground-glass opacity and interlobular septal thickening with upper lobe predominant and new compared to the prior CT. Differentials include ARDS, bacterial pneumonia, acute interstitial pneumonia, or pulmonary alveolar proteinosis. Other etiologies are not excluded. Clinical correlation and pulmonary consult is advised. There is moderate bilateral  pleural effusions, new since the prior CT with associated partial compressive atelectasis of the lower lobes. No pneumothorax. The central airways are patent. Musculoskeletal: There is diffuse subcutaneous edema and anasarca. The osseous structures are intact. Review of the MIP images confirms the above findings. CT ABDOMEN and PELVIS FINDINGS No intra-abdominal free air.  Small ascites. Hepatobiliary: Indeterminate 15 mm hypodense lesion in the right lobe of the liver similar to prior CT. No intrahepatic biliary ductal dilatation. The gallbladder is unremarkable. Pancreas: Unremarkable. No pancreatic ductal dilatation or surrounding inflammatory changes. Spleen: Normal in size without focal abnormality. Adrenals/Urinary Tract: The adrenal glands, kidneys, and the visualized ureters appear unremarkable. The urinary bladder is decompressed around a Foley catheter. Air within the bladder introduced via catheter. Stomach/Bowel: There is no bowel obstruction or active inflammation. Normal appendix. Vascular/Lymphatic: Mild aortoiliac atherosclerotic disease. No portal venous gas. There is no adenopathy. Reproductive: The prostate and seminal vesicles are grossly unremarkable. Other: Diffuse subcutaneous edema and anasarca. Musculoskeletal: Degenerative changes of the spine. No acute osseous pathology. Review of the MIP images confirms the above findings. IMPRESSION: 1. Diffuse bilateral airspace density and interlobular septal prominence with a "crazy paving" appearance and new since the prior CT. Differentials include ARDS, bacterial pneumonia, acute  interstitial pneumonitis, or pulmonary alveolar proteinosis. Clinical correlation and pulmonary consult is advised. 2. Interval development of moderate size bilateral pleural effusions with associated partial compressive atelectasis of the lower lobes. 3. No CT evidence of pulmonary artery embolus. 4. Small ascites and anasarca. Electronically Signed   By: Elgie Collard M.D.   On: 03/26/2017 05:04                         MAJOR EVENTS/TEST RESULTS:   INDWELLING DEVICES::  MICRO DATA: MRSA PCR - negative Urine  Blood Resp   ANTIMICROBIALS:  3/26 Cefepime, vancomycin   ASSESSMENT/PLAN   1. Bilateral Pneumonia- HCAP  Cannot rule out early BOOP 2. Acute respiratory failure  With hypoxia 3. Uncontrolled hypertension 4  Hx of seizure disorder 5. Hx Nicotine dependency  Plan 1. Continue with ABX, will try to get sputum culture 2. Placed on NIPPV this AM and much better this afternoon 3. Will continue on Solumedrol 4. GI and DVT prophylaxis 5. Thiamine, folic acid, MVI 6. Incentive spirometry 7. Continue on Keppra, check levels 8. Start antihypertensive therapy  Family: wife and son updated     I have personally obtained a history, examined the patient, evaluated laboratory and independently reviewed  imaging results, formulated the assessment and plan and placed orders.  The Patient requires high complexity decision making for assessment and support, frequent evaluation and titration of therapies, application of advanced monitoring technologies and extensive interpretation of multiple databases. Critical Care Time devoted to patient care services described in this note is 40 minutes.   Overall, patient is critically ill, prognosis is guarded. Patient at high risk for cardiac arrest and death.   Jackson Latino, M.D

## 2017-03-26 NOTE — Evaluation (Signed)
Physical Therapy Evaluation Patient Details Name: Marco [name] MRN: [id number] DOB: [birth year] Today's Date: 03/26/2017   History of Present Illness  62 yo male with onset of HCAP diagnosed recently and septic, mult admissions to ED recently, acute respiratory failure.  Has hypoxia, ascites, anasarca, HTN, may get a thoracentesis in a day or so.  Has indwelling foley, not on O2 previously.  PMHx:  seizures, PNA, sepsis, OA  Clinical Impression  Pt was seen for evaluation of mobiltiy with significant limits from PNA and SOB.  He is tired and in IC bed, able to be assisted to sit only then was unwilling to attempt standing.  Pulses and O2 sats were reasonable and will expect pt to use the walker to take steps and sit up next visit.  Follow acutely for these goals to decrease time in rehab but pt may refuse all therapy.    Follow Up Recommendations SNF    Equipment Recommendations  Rolling walker with 5" wheels(if pt does not have a walker in good shape)    Recommendations for Other Services       Precautions / Restrictions Precautions Precautions: Fall Restrictions Weight Bearing Restrictions: No      Mobility  Bed Mobility Overal bed mobility: Needs Assistance Bed Mobility: Supine to Sit     Supine to sit: Mod assist;Min assist     General bed mobility comments: assisted trunk to sit up  Transfers Overall transfer level: Needs assistance               General transfer comment: pt was not feeling well enough to stand   Ambulation/Gait Ambulation/Gait assistance: Min assist Ambulation Distance (Feet): 6 Feet Assistive device: Rolling walker (2 wheeled) Gait Pattern/deviations: Step-to pattern;Step-through pattern;Decreased stride length;Wide base of support;Drifts right/left;Trunk flexed Gait velocity: reduced Gait velocity interpretation: Below normal speed for age/gender General Gait Details: used 2L O2 with longer cannula line, able to get to chair but then  fatigued and SOB  Stairs            Wheelchair Mobility    Modified Rankin (Stroke Patients Only)       Balance Overall balance assessment: Needs assistance Sitting-balance support: Feet supported Sitting balance-Leahy Scale: Fair                                       Pertinent Vitals/Pain Pain Assessment: No/denies pain    Home Living Family/patient expects to be discharged to:: Private residence Living Arrangements: Spouse/significant other Available Help at Discharge: Family;Available 24 hours/day Type of Home: House       Home Layout: One level Home Equipment: Cane - single point;Walker - 2 wheels Additional Comments: received RW recently    Prior Function Level of Independence: Needs assistance   Gait / Transfers Assistance Needed: RW with no assistance  ADL's / Homemaking Assistance Needed: has family to assist with running the home but he is able to dress and bathe himself normally        Hand Dominance        Extremity/Trunk Assessment   Upper Extremity Assessment Upper Extremity Assessment: Overall WFL for tasks assessed    Lower Extremity Assessment Lower Extremity Assessment: Generalized weakness    Cervical / Trunk Assessment Cervical / Trunk Assessment: Normal  Communication   Communication: No difficulties  Cognition Arousal/Alertness: Awake/alert Behavior During Therapy: Impulsive Overall Cognitive Status: Within Functional Limits for tasks assessed  General Comments: agreed to get up to side of bed      General Comments General comments (skin integrity, edema, etc.): Pt is up to side of bed with min mod assist but then feeling poorly and back to bed, O2 sats remained at 95% or better and pulse at 110 at highest.     Exercises     Assessment/Plan    PT Assessment Patient needs continued PT services  PT Problem List Decreased strength;Decreased range of  motion;Decreased activity tolerance;Decreased balance;Decreased mobility;Decreased coordination;Decreased knowledge of use of DME;Decreased safety awareness;Cardiopulmonary status limiting activity;Obesity       PT Treatment Interventions DME instruction;Gait training;Functional mobility training;Therapeutic activities;Therapeutic exercise;Balance training;Patient/family education    PT Goals (Current goals can be found in the Care Plan section)  Acute Rehab PT Goals Patient Stated Goal: go directly home PT Goal Formulation: With patient Time For Goal Achievement: 04/09/17 Potential to Achieve Goals: Good    Frequency Min 2X/week   Barriers to discharge Decreased caregiver support home with family help but one person    Co-evaluation               AM-PAC PT "6 Clicks" Daily Activity  Outcome Measure Difficulty turning over in bed (including adjusting bedclothes, sheets and blankets)?: A Little Difficulty moving from lying on back to sitting on the side of the bed? : Unable Difficulty sitting down on and standing up from a chair with arms (e.g., wheelchair, bedside commode, etc,.)?: Unable Help needed moving to and from a bed to chair (including a wheelchair)?: Total Help needed walking in hospital room?: Total Help needed climbing 3-5 steps with a railing? : Total 6 Click Score: 8    End of Session Equipment Utilized During Treatment: Oxygen(on Bipap) Activity Tolerance: Treatment limited secondary to medical complications (Comment);Patient limited by fatigue Patient left: in bed;with call bell/phone within reach;with bed alarm set Nurse Communication: Mobility status PT Visit Diagnosis: Muscle weakness (generalized) (M62.81);Difficulty in walking, not elsewhere classified (R26.2);Adult, failure to thrive (R62.7)    Time: 1345-1401 PT Time Calculation (min) (ACUTE ONLY): 16 min   Charges:   PT Evaluation $PT Eval Moderate Complexity: 1 Mod     PT G Codes:   PT  G-Codes **NOT FOR INPATIENT CLASS** Functional Assessment Tool Used: AM-PAC 6 Clicks Basic Mobility    Ivar DrapeRuth E Lupita Rosales 03/26/2017, 5:47 PM   Samul Dadauth Broderick Fonseca, PT MS Acute Rehab Dept. Number: [facility] [phone] and [facility] [phone]

## 2017-03-26 NOTE — Care Management (Signed)
RNCM notified by [facility] that patient was never opened due to re-admit to hospital. [facility]

## 2017-03-26 NOTE — ED Notes (Signed)
Pt is anxious and respiratory rate is up and pt has nausea. Temp normal. Pt has been resting quietly but after antibiotic was infused, pt began showing symptoms. Zofran given.

## 2017-03-26 NOTE — Care Management (Addendum)
Readmission. Meets criteria for HRI services- request sent to Dr. Aram BeechamJeffrey Sparks PA. It doesn't appear that patient received home O2 last admission. A home health referral was made to [facility] however he has not been out of the hospital long enough for them to start care with him.  He has discharges on 03/14/17 and 03/24/17. [facility] He has a walker and cane available at home. He lives wit his wife. He did receive medication assistance by CM department and a referral to Medication management.  I have sent message to Medication Management to see what Medications were obtained. RNCM team will follow. [facility] updated. 1142AM: HRI approved by Dr. Aram BeechamJeffrey Sparks PA.

## 2017-03-26 NOTE — Progress Notes (Signed)
[employer] - [place] at [facility]                                                                                                                                                                                  Patient Demographics   Marco [name], is a 62 y.o. male, DOB - [birth year], UEA:[id number]  Admit date - 03/25/2017   Admitting Physician Arnaldo Natal, MD  Outpatient Primary MD for the patient is Jerl Mina, MD   LOS - 0  Subjective: Patient was recently hospitalized for HCAP and acute respiratory failure Now on BiPAP Lethargic not able to provide any review of systems wife at bedside  Review of Systems:   CONSTITUTIONAL: Unable to provide  Vitals:   Vitals:   03/26/17 1300 03/26/17 1400 03/26/17 1500 03/26/17 1600  BP: (!) 161/98 (!) 174/102 (!) 160/112 (!) 157/79  Pulse: 94 (!) 107 (!) 107 (!) 112  Resp: 15 (!) 25 (!) 24 (!) 29  Temp:    (!) 97.4 F (36.3 C)  TempSrc:    Oral  SpO2: 99% 100% 100% 96%  Weight:      Height:        Wt Readings from Last 3 Encounters:  03/26/17 163 lb 9.3 oz (74.2 kg)  03/24/17 165 lb 6.4 oz (75 kg)  03/17/17 140 lb (63.5 kg)     Intake/Output Summary (Last 24 hours) at 03/26/2017 1634 Last data filed at 03/26/2017 1600 Gross per 24 hour  Intake 300 ml  Output 2600 ml  Net -2300 ml    Physical Exam:   GENERAL: Critically ill-appearing HEAD, EYES, EARS, NOSE AND THROAT: Atraumatic, normocephalic. Extraocular muscles are intact. Pupils equal and reactive to light. Sclerae anicteric. No conjunctival injection. No oro-pharyngeal erythema.  NECK: Supple. There is no jugular venous distention. No bruits, no lymphadenopathy, no thyromegaly.  HEART: Regular rate and rhythm,. No murmurs, no rubs, no clicks.  LUNGS: Decreased breath sounds bilaterally no accessory muscle usage ABDOMEN: Soft, flat, nontender, nondistended. Has good bowel sounds. No hepatosplenomegaly appreciated.  EXTREMITIES: No evidence  of any cyanosis, clubbing, or peripheral edema.  +2 pedal and radial pulses bilaterally.  NEUROLOGIC: Lethargic SKIN: Moist and warm with no rashes appreciated.  Psych: Not anxious, depressed LN: No inguinal LN enlargement    Antibiotics   Anti-infectives (From admission, onward)   Start     Dose/Rate Route Frequency Ordered Stop   03/26/17 1000  ceFEPIme (MAXIPIME) 2 g in sodium chloride 0.9 % 100 mL IVPB     2 g 200 mL/hr over 30 Minutes Intravenous Every 8 hours 03/26/17 0237     03/26/17 0900  vancomycin (VANCOCIN) IVPB 1000  mg/200 mL premix  Status:  Discontinued     1,000 mg 200 mL/hr over 60 Minutes Intravenous Every 12 hours 03/26/17 0410 03/26/17 1029   03/26/17 0115  ceFEPIme (MAXIPIME) 2 g in sodium chloride 0.9 % 100 mL IVPB     2 g 200 mL/hr over 30 Minutes Intravenous  Once 03/26/17 0100 03/26/17 0222   03/26/17 0115  vancomycin (VANCOCIN) IVPB 1000 mg/200 mL premix     1,000 mg 200 mL/hr over 60 Minutes Intravenous  Once 03/26/17 0100 03/26/17 0630   03/26/17 0000  Ampicillin-Sulbactam (UNASYN) 3 g in sodium chloride 0.9 % 100 mL IVPB  Status:  Discontinued     3 g 200 mL/hr over 30 Minutes Intravenous Every 6 hours 03/25/17 2325 03/26/17 0148      Medications   Scheduled Meds: . docusate sodium  100 mg Oral BID  . enoxaparin (LOVENOX) injection  40 mg Subcutaneous Q24H  . feeding supplement (ENSURE ENLIVE)  237 mL Oral BID BM  . folic acid  1 mg Oral Daily  . ipratropium-albuterol  3 mL Nebulization Q4H  . levETIRAcetam  750 mg Oral BID  . methylPREDNISolone (SOLU-MEDROL) injection  40 mg Intravenous Q6H  . mometasone-formoterol  2 puff Inhalation BID  . multivitamin-lutein  1 capsule Oral Daily  . nicotine  21 mg Transdermal Daily  . pantoprazole  40 mg Oral Daily  . [START ON 03/27/2017] pneumococcal 23 valent vaccine  0.5 mL Intramuscular Tomorrow-1000  . tamsulosin  0.4 mg Oral Daily  . thiamine  100 mg Oral Daily  . cyanocobalamin  2,000 mcg Oral  Daily   Continuous Infusions: . ceFEPime (MAXIPIME) IV Stopped (03/26/17 0945)   PRN Meds:.acetaminophen **OR** acetaminophen, albuterol, hydrALAZINE, labetalol, ondansetron **OR** ondansetron (ZOFRAN) IV   Data Review:   Micro Results Recent Results (from the past 240 hour(s))  Culture, blood (Routine x 2)     Status: None   Collection Time: 03/21/17  1:41 PM  Result Value Ref Range Status   Specimen Description BLOOD RIGHT [facility]  Final   Special Requests   Final    BOTTLES DRAWN AEROBIC AND ANAEROBIC Blood Culture adequate volume   Culture   Final    NO GROWTH 5 DAYS Performed at [facility], [address] [place], [place] [address]    Report Status 03/26/2017 FINAL  Final  Culture, blood (Routine x 2)     Status: None   Collection Time: 03/21/17  1:41 PM  Result Value Ref Range Status   Specimen Description BLOOD  LEFT HAND  Final   Special Requests   Final    BOTTLES DRAWN AEROBIC AND ANAEROBIC Blood Culture results may not be optimal due to an inadequate volume of blood received in culture bottles   Culture   Final    NO GROWTH 5 DAYS Performed at [facility], [address] [place], [place] [address]    Report Status 03/26/2017 FINAL  Final  MRSA PCR Screening     Status: None   Collection Time: 03/21/17  6:21 PM  Result Value Ref Range Status   MRSA by PCR NEGATIVE NEGATIVE Final    Comment:        The GeneXpert MRSA Assay (FDA approved for NASAL specimens only), is one component of a comprehensive MRSA colonization surveillance program. It is not intended to diagnose MRSA infection nor to guide or monitor treatment for MRSA infections. Performed at [facility], [address], [place], [place] [address]  MRSA PCR Screening     Status: None   Collection Time: 03/26/17  6:34 AM  Result Value Ref Range Status   MRSA by PCR NEGATIVE NEGATIVE Final    Comment:        The GeneXpert MRSA Assay (FDA approved for NASAL  specimens only), is one component of a comprehensive MRSA colonization surveillance program. It is not intended to diagnose MRSA infection nor to guide or monitor treatment for MRSA infections. Performed at [facility], [address], [place], [place] [address]     Radiology Reports Dg Chest 1 View  Result Date: 03/23/2017 CLINICAL DATA:  Shortness of breath for the past 3 days.  Smoker. EXAM: CHEST  1 VIEW COMPARISON:  03/21/2017. FINDINGS: Interval borderline enlargement of the cardiac silhouette and extensive right lung airspace opacity. The interstitial markings on the left are prominent with faint airspace opacities with mild progression. No pleural fluid seen. Thoracolumbar spine degenerative changes. IMPRESSION: 1. Significant worsening of airspace opacity throughout the right lung, suspicious for pneumonia. 2. Mild increase in prominence of the interstitial markings and faint airspace opacities on the left, also suspicious for pneumonia. 3. Interval borderline cardiomegaly. This is accentuated by the portable AP technique and poor inspiration. Electronically Signed   By: Beckie Salts M.D.   On: 03/23/2017 14:10   Dg Chest 2 View  Result Date: 03/25/2017 CLINICAL DATA:  Shortness of Breath EXAM: CHEST - 2 VIEW COMPARISON:  03/23/2017 FINDINGS: Small bilateral pleural effusions. Patchy bilateral airspace opacities are similar to prior study, compatible with pneumonia. Heart is normal size. IMPRESSION: Bilateral airspace opacities, right greater than left are similar prior study, compatible with multifocal pneumonia. Small bilateral effusions. Electronically Signed   By: Charlett Nose M.D.   On: 03/25/2017 20:56   Dg Chest 2 View  Result Date: 03/21/2017 CLINICAL DATA:  Not feeling well.  Shortness of breath. EXAM: CHEST - 2 VIEW COMPARISON:  03/17/2017. FINDINGS: BILATERAL airspace opacities have worsened RIGHT greater than LEFT since the prior radiograph consistent with  pneumonia. No effusion or pneumothorax. No significant lobar atelectasis. Normal heart size. No bony abnormality. Suspected COPD. IMPRESSION: Worsening aeration suggesting BILATERAL pneumonia, worse on the RIGHT. Electronically Signed   By: Elsie Stain M.D.   On: 03/21/2017 14:06   Dg Chest 2 View  Result Date: 03/04/2017 CLINICAL DATA:  Cough EXAM: CHEST  2 VIEW COMPARISON:  11/10/2016 FINDINGS: Normal heart size and mediastinal contours. Borderline hyperinflation. No acute infiltrate or edema. No effusion or pneumothorax. No acute osseous findings. IMPRESSION: Negative chest. Electronically Signed   By: Marnee Spring M.D.   On: 03/04/2017 15:20   Ct Head Wo Contrast  Result Date: 03/17/2017 CLINICAL DATA:  Chest pain starting at 1830 hours with dyspnea. Altered level of consciousness. Patient could not recognize family members. EXAM: CT HEAD WITHOUT CONTRAST TECHNIQUE: Contiguous axial images were obtained from the base of the skull through the vertex without intravenous contrast. COMPARISON:  03/04/2017 FINDINGS: Brain: Stable superficial and central atrophy with chronic appearing small vessel ischemic disease. No acute intracranial hemorrhage midline shift or edema. No intra-axial mass nor extra-axial fluid collections. Midline fourth ventricle and basal cisterns. Vascular: No hyperdense vessel sign. Skull: No acute skull fracture or suspicious osseous lesions. Sinuses/Orbits: Stable findings of polypoid mucosal thickening involving the medial right maxillary sinus. Partial opacification of left mastoid air cells also chronic. Other: None IMPRESSION: 1. Stable involutional changes of the brain with small vessel ischemic disease. No acute intracranial abnormality. 2.  Stable polypoid opacification medial aspect right maxillary sinus potentially represent small polyp or mucous retention cyst. Chronic left mastoid effusion. Electronically Signed   By: Tollie Eth M.D.   On: 03/17/2017 21:38   Ct Head Wo  Contrast  Result Date: 03/04/2017 CLINICAL DATA:  62 year old male with daily alcohol intake. Seizures, on seizure medication. Somnolence and confusion. Initial encounter. EXAM: CT HEAD WITHOUT CONTRAST TECHNIQUE: Contiguous axial images were obtained from the base of the skull through the vertex without intravenous contrast. COMPARISON:  11/11/2013 head CT. FINDINGS: Brain: No intracranial hemorrhage or CT evidence of large acute infarct. Moderate global atrophy. No intracranial mass lesion noted on this unenhanced exam. Vascular: Minimal vascular calcifications Skull: No acute abnormality. Sinuses/Orbits: No acute orbital abnormality. Mild polypoid opacification medial aspect right maxillary sinus. Remainder of visualized paranasal sinuses are clear. Other: Partial opacification left mastoid air cells without obstructing lesion of the eustachian tube noted. IMPRESSION: No acute intracranial abnormality noted. Moderate global atrophy. Polypoid opacification medial aspect right maxillary sinus and partial opacification left mastoid air cells. Electronically Signed   By: Lacy Duverney M.D.   On: 03/04/2017 17:57   Ct Chest W Contrast  Result Date: 03/04/2017 CLINICAL DATA:  Unintended weight loss. Nonlocalized abdominal pain. Recent nausea and vomiting. History of daily ETOH use and seizures. EXAM: CT CHEST, ABDOMEN, AND PELVIS WITH CONTRAST TECHNIQUE: Multidetector CT imaging of the chest, abdomen and pelvis was performed following the standard protocol during bolus administration of intravenous contrast. CONTRAST:  ISOVUE-300 IOPAMIDOL (ISOVUE-300) INJECTION 61% COMPARISON:  CT AP 08/06/2009 FINDINGS: CT CHEST FINDINGS Cardiovascular: Normal branch pattern of the great vessels. No aortic aneurysm or dissection. No large central pulmonary embolus. Normal sized heart without pericardial effusion. Minimal coronary arteriosclerosis along the circumflex. Mediastinum/Nodes: No enlarged mediastinal, hilar, or  axillary lymph nodes. Thyroid gland, trachea, and esophagus demonstrate no significant findings. Lungs/Pleura: Minimal scarring and/or atelectasis at the apices. Faint nonspecific ground-glass opacities in both upper lobes and lingula with subpleural areas of atelectasis and/or scarring in the right middle lobe. No effusion or pneumothorax. Musculoskeletal: Bilateral gynecomastia. CT ABDOMEN PELVIS FINDINGS Hepatobiliary: Subtle surface nodularity of the liver that may reflect morphologic changes of cirrhosis. A pair of adjacent subcentimeter hypodensities in the right hepatic lobe are noted compatible with cysts or hemangiomata. A third similar subcentimeter hypodensity is seen further caudad anteriorly within the right hepatic lobe measuring up to 8 mm. No biliary dilatation. Normal gallbladder. Pancreas: No ductal dilatation, inflammation or mass. Spleen: Normal Adrenals/Urinary Tract: Normal bilateral adrenal glands. Symmetric enhancement of both kidneys without obstructive uropathy. Normal bladder. Stomach/Bowel: Small hiatal hernia. Physiologic distention of the stomach. Mild small bowel distention with fluid with slight fold thickening in the central abdomen compatible with an enteritis. No mechanical source of obstruction is seen. Decompressed descending colon through rectum. Liquid stool noted within the cecum and ascending colon. No evidence of appendicitis. Vascular/Lymphatic: Aortoiliac and branch vessel atherosclerosis without aneurysm or dissection. Reproductive: Prostate is unremarkable. Other: No abdominal wall hernia or abnormality. No abdominopelvic ascites. Musculoskeletal: Degenerative disc disease L5-S1. No acute nor suspicious osseous abnormality. IMPRESSION: 1. Nonspecific faint ground-glass opacities are seen in both upper lobes left greater than right as well as lingula. Findings may represent small foci of alveolitis or pneumonitis are nonspecific. 2. Minimal coronary arteriosclerosis. 3.  Mild fluid distention of small bowel without mechanical obstruction suspicious for mild small bowel enteritis. 4. Morphologic appearance of cirrhosis. 5. Three subcentimeter hypodensities in the right hepatic lobe are noted too  small to further characterize but statistically consistent with cysts or hemangiomata. 6. Degenerative disc disease L5-S1. Electronically Signed   By: Tollie Ethavid  Kwon M.D.   On: 03/04/2017 18:02   Ct Angio Chest Pe W/cm &/or Wo Cm  Result Date: 03/26/2017 CLINICAL DATA:  62 year old male with recent discharge from the hospital for pneumonia and sepsis. Worsening of shortness of breath and confusion. EXAM: CT ANGIOGRAPHY CHEST CT ABDOMEN AND PELVIS WITH CONTRAST TECHNIQUE: Multidetector CT imaging of the chest was performed using the standard protocol during bolus administration of intravenous contrast. Multiplanar CT image reconstructions and MIPs were obtained to evaluate the vascular anatomy. Multidetector CT imaging of the abdomen and pelvis was performed using the standard protocol during bolus administration of intravenous contrast. CONTRAST:  100mL ISOVUE-370 IOPAMIDOL (ISOVUE-370) INJECTION 76% COMPARISON:  Chest CT dated 03/04/2017 FINDINGS: CTA CHEST FINDINGS Cardiovascular: There is no cardiomegaly or pericardial effusion. The thoracic aorta is unremarkable. The origins of the great vessels of the aortic arch appear patent. There is no CT evidence of pulmonary embolism. Mediastinum/Nodes: Bilateral hilar and mediastinal adenopathy, reactive. Right hilar lymph node measures 13 mm in short axis. The esophagus is grossly unremarkable. No mediastinal fluid collection. Lungs/Pleura: There are extensive bilateral patchy ground-glass opacity and interlobular septal thickening with upper lobe predominant and new compared to the prior CT. Differentials include ARDS, bacterial pneumonia, acute interstitial pneumonia, or pulmonary alveolar proteinosis. Other etiologies are not excluded.  Clinical correlation and pulmonary consult is advised. There is moderate bilateral pleural effusions, new since the prior CT with associated partial compressive atelectasis of the lower lobes. No pneumothorax. The central airways are patent. Musculoskeletal: There is diffuse subcutaneous edema and anasarca. The osseous structures are intact. Review of the MIP images confirms the above findings. CT ABDOMEN and PELVIS FINDINGS No intra-abdominal free air.  Small ascites. Hepatobiliary: Indeterminate 15 mm hypodense lesion in the right lobe of the liver similar to prior CT. No intrahepatic biliary ductal dilatation. The gallbladder is unremarkable. Pancreas: Unremarkable. No pancreatic ductal dilatation or surrounding inflammatory changes. Spleen: Normal in size without focal abnormality. Adrenals/Urinary Tract: The adrenal glands, kidneys, and the visualized ureters appear unremarkable. The urinary bladder is decompressed around a Foley catheter. Air within the bladder introduced via catheter. Stomach/Bowel: There is no bowel obstruction or active inflammation. Normal appendix. Vascular/Lymphatic: Mild aortoiliac atherosclerotic disease. No portal venous gas. There is no adenopathy. Reproductive: The prostate and seminal vesicles are grossly unremarkable. Other: Diffuse subcutaneous edema and anasarca. Musculoskeletal: Degenerative changes of the spine. No acute osseous pathology. Review of the MIP images confirms the above findings. IMPRESSION: 1. Diffuse bilateral airspace density and interlobular septal prominence with a "crazy paving" appearance and new since the prior CT. Differentials include ARDS, bacterial pneumonia, acute interstitial pneumonitis, or pulmonary alveolar proteinosis. Clinical correlation and pulmonary consult is advised. 2. Interval development of moderate size bilateral pleural effusions with associated partial compressive atelectasis of the lower lobes. 3. No CT evidence of pulmonary artery  embolus. 4. Small ascites and anasarca. Electronically Signed   By: Elgie CollardArash  Radparvar M.D.   On: 03/26/2017 05:04   Ct Abdomen Pelvis W Contrast  Result Date: 03/26/2017 CLINICAL DATA:  62 year old male with recent discharge from the hospital for pneumonia and sepsis. Worsening of shortness of breath and confusion. EXAM: CT ANGIOGRAPHY CHEST CT ABDOMEN AND PELVIS WITH CONTRAST TECHNIQUE: Multidetector CT imaging of the chest was performed using the standard protocol during bolus administration of intravenous contrast. Multiplanar CT image reconstructions and MIPs were obtained to  evaluate the vascular anatomy. Multidetector CT imaging of the abdomen and pelvis was performed using the standard protocol during bolus administration of intravenous contrast. CONTRAST:  ISOVUE-370 IOPAMIDOL (ISOVUE-370) INJECTION 76% COMPARISON:  Chest CT dated 03/04/2017 FINDINGS: CTA CHEST FINDINGS Cardiovascular: There is no cardiomegaly or pericardial effusion. The thoracic aorta is unremarkable. The origins of the great vessels of the aortic arch appear patent. There is no CT evidence of pulmonary embolism. Mediastinum/Nodes: Bilateral hilar and mediastinal adenopathy, reactive. Right hilar lymph node measures 13 mm in short axis. The esophagus is grossly unremarkable. No mediastinal fluid collection. Lungs/Pleura: There are extensive bilateral patchy ground-glass opacity and interlobular septal thickening with upper lobe predominant and new compared to the prior CT. Differentials include ARDS, bacterial pneumonia, acute interstitial pneumonia, or pulmonary alveolar proteinosis. Other etiologies are not excluded. Clinical correlation and pulmonary consult is advised. There is moderate bilateral pleural effusions, new since the prior CT with associated partial compressive atelectasis of the lower lobes. No pneumothorax. The central airways are patent. Musculoskeletal: There is diffuse subcutaneous edema and anasarca. The  osseous structures are intact. Review of the MIP images confirms the above findings. CT ABDOMEN and PELVIS FINDINGS No intra-abdominal free air.  Small ascites. Hepatobiliary: Indeterminate 15 mm hypodense lesion in the right lobe of the liver similar to prior CT. No intrahepatic biliary ductal dilatation. The gallbladder is unremarkable. Pancreas: Unremarkable. No pancreatic ductal dilatation or surrounding inflammatory changes. Spleen: Normal in size without focal abnormality. Adrenals/Urinary Tract: The adrenal glands, kidneys, and the visualized ureters appear unremarkable. The urinary bladder is decompressed around a Foley catheter. Air within the bladder introduced via catheter. Stomach/Bowel: There is no bowel obstruction or active inflammation. Normal appendix. Vascular/Lymphatic: Mild aortoiliac atherosclerotic disease. No portal venous gas. There is no adenopathy. Reproductive: The prostate and seminal vesicles are grossly unremarkable. Other: Diffuse subcutaneous edema and anasarca. Musculoskeletal: Degenerative changes of the spine. No acute osseous pathology. Review of the MIP images confirms the above findings. IMPRESSION: 1. Diffuse bilateral airspace density and interlobular septal prominence with a "crazy paving" appearance and new since the prior CT. Differentials include ARDS, bacterial pneumonia, acute interstitial pneumonitis, or pulmonary alveolar proteinosis. Clinical correlation and pulmonary consult is advised. 2. Interval development of moderate size bilateral pleural effusions with associated partial compressive atelectasis of the lower lobes. 3. No CT evidence of pulmonary artery embolus. 4. Small ascites and anasarca. Electronically Signed   By: Elgie Collard M.D.   On: 03/26/2017 05:04   Ct Abdomen Pelvis W Contrast  Result Date: 03/04/2017 CLINICAL DATA:  Unintended weight loss. Nonlocalized abdominal pain. Recent nausea and vomiting. History of daily ETOH use and seizures. EXAM:  CT CHEST, ABDOMEN, AND PELVIS WITH CONTRAST TECHNIQUE: Multidetector CT imaging of the chest, abdomen and pelvis was performed following the standard protocol during bolus administration of intravenous contrast. CONTRAST:  ISOVUE-300 IOPAMIDOL (ISOVUE-300) INJECTION 61% COMPARISON:  CT AP 08/06/2009 FINDINGS: CT CHEST FINDINGS Cardiovascular: Normal branch pattern of the great vessels. No aortic aneurysm or dissection. No large central pulmonary embolus. Normal sized heart without pericardial effusion. Minimal coronary arteriosclerosis along the circumflex. Mediastinum/Nodes: No enlarged mediastinal, hilar, or axillary lymph nodes. Thyroid gland, trachea, and esophagus demonstrate no significant findings. Lungs/Pleura: Minimal scarring and/or atelectasis at the apices. Faint nonspecific ground-glass opacities in both upper lobes and lingula with subpleural areas of atelectasis and/or scarring in the right middle lobe. No effusion or pneumothorax. Musculoskeletal: Bilateral gynecomastia. CT ABDOMEN PELVIS FINDINGS Hepatobiliary: Subtle surface nodularity of the liver that  may reflect morphologic changes of cirrhosis. A pair of adjacent subcentimeter hypodensities in the right hepatic lobe are noted compatible with cysts or hemangiomata. A third similar subcentimeter hypodensity is seen further caudad anteriorly within the right hepatic lobe measuring up to 8 mm. No biliary dilatation. Normal gallbladder. Pancreas: No ductal dilatation, inflammation or mass. Spleen: Normal Adrenals/Urinary Tract: Normal bilateral adrenal glands. Symmetric enhancement of both kidneys without obstructive uropathy. Normal bladder. Stomach/Bowel: Small hiatal hernia. Physiologic distention of the stomach. Mild small bowel distention with fluid with slight fold thickening in the central abdomen compatible with an enteritis. No mechanical source of obstruction is seen. Decompressed descending colon through rectum. Liquid stool noted  within the cecum and ascending colon. No evidence of appendicitis. Vascular/Lymphatic: Aortoiliac and branch vessel atherosclerosis without aneurysm or dissection. Reproductive: Prostate is unremarkable. Other: No abdominal wall hernia or abnormality. No abdominopelvic ascites. Musculoskeletal: Degenerative disc disease L5-S1. No acute nor suspicious osseous abnormality. IMPRESSION: 1. Nonspecific faint ground-glass opacities are seen in both upper lobes left greater than right as well as lingula. Findings may represent small foci of alveolitis or pneumonitis are nonspecific. 2. Minimal coronary arteriosclerosis. 3. Mild fluid distention of small bowel without mechanical obstruction suspicious for mild small bowel enteritis. 4. Morphologic appearance of cirrhosis. 5. Three subcentimeter hypodensities in the right hepatic lobe are noted too small to further characterize but statistically consistent with cysts or hemangiomata. 6. Degenerative disc disease L5-S1. Electronically Signed   By: Tollie Eth M.D.   On: 03/04/2017 18:02   Dg Chest Portable 1 View  Result Date: 03/17/2017 CLINICAL DATA:  Chest pain starting at 1830 hours. EXAM: PORTABLE CHEST 1 VIEW COMPARISON:  03/04/2017 CXR FINDINGS: Portable AP semi upright view. Heart size is mildly enlarged but this may be due to the portable technique and slightly low lung volumes. No overt pulmonary edema, pneumonic consolidation or effusion. There is bibasilar as well as right upper lobe atelectasis. No acute nor suspicious osseous abnormalities. IMPRESSION: Bibasilar and right upper lobe atelectasis. Borderline cardiomegaly. Slightly low lung volumes. Electronically Signed   By: Tollie Eth M.D.   On: 03/17/2017 21:33     CBC Recent Labs  Lab 03/21/17 1338 03/22/17 0408 03/25/17 2024  WBC 15.4* 9.2 11.6*  HGB 15.1 12.4* 10.7*  HCT 45.1 35.9* 32.4*  PLT 259 187 281  MCV 98.9 97.8 99.6  MCH 33.2 33.7 33.0  MCHC 33.6 34.5 33.1  RDW 14.9* 14.4 14.6*   LYMPHSABS 0.7*  --   --   MONOABS 0.7  --   --   EOSABS 0.0  --   --   BASOSABS 0.0  --   --     Chemistries  Recent Labs  Lab 03/21/17 1338 03/22/17 0408 03/22/17 1521 03/23/17 0733 03/24/17 0612 03/25/17 2024 03/26/17 0520  NA 128* 130*  --  136 136 137  --   K 3.3* 3.3*  --  3.9 4.3 4.2  --   CL 99* 107  --  113* 112* 107  --   CO2 15* 17*  --  18* 18* 21*  --   GLUCOSE 117* 80  --  83 136* 97  --   BUN 10 11  --  12 15 16   --   CREATININE 1.08 0.98  --  0.97 0.91 0.83  --   CALCIUM 8.6* 7.5*  --  8.1* 8.4* 8.8*  --   MG  --   --  1.8 1.9 2.0  --   --  AST 40  --   --   --   --   --  108*  ALT 14*  --   --   --   --   --  36  ALKPHOS 114  --   --   --   --   --  212*  BILITOT 1.8*  --   --   --   --   --  1.4*   ------------------------------------------------------------------------------------------------------------------ estimated creatinine clearance is 84.3 mL/min (by C-G formula based on SCr of 0.83 mg/dL). ------------------------------------------------------------------------------------------------------------------ No results for input(s): HGBA1C in the last 72 hours. ------------------------------------------------------------------------------------------------------------------ No results for input(s): CHOL, HDL, LDLCALC, TRIG, CHOLHDL, LDLDIRECT in the last 72 hours. ------------------------------------------------------------------------------------------------------------------ Recent Labs    03/26/17 0631  TSH 2.056   ------------------------------------------------------------------------------------------------------------------ No results for input(s): VITAMINB12, FOLATE, FERRITIN, TIBC, IRON, RETICCTPCT in the last 72 hours.  Coagulation profile Recent Labs  Lab 03/21/17 1338 03/26/17 0631  INR 1.31 1.18    No results for input(s): DDIMER in the last 72 hours.  Cardiac Enzymes Recent Labs  Lab 03/21/17 1338 03/25/17 2024  03/25/17 2221  TROPONINI <0.03 0.03* <0.03   ------------------------------------------------------------------------------------------------------------------ Invalid input(s): POCBNP    Assessment & Plan   This is a 62 year old male admitted for pneumonia. 1.    Acute hypoxic respiratory failure differential includes possible recurrent pneumonia Possible ARDS Possible CHF He responded to a dose of IV Lasix Will place on IV Lasix twice daily Pulmonary critical care following Echo of the heart pending 2.  Sepsis: The patient meets criteria via tachycardia, tachypnea and leukocytosis.  The patient is hemodynamically labile but not hypotensive.   Continue IV antibiotics 3.  Seizure disorder: Continue Keppra 4.  BPH: Foley catheter in place; continue tamsulosin 5.  DVT prophylaxis: Lovenox 6.  GI prophylaxis: Pantoprazole per home regimen The patient is a full code.  Time spent on admission orders and critical care        Code Status Orders  (From admission, onward)        Start     Ordered   03/26/17 0630  Full code  Continuous     03/26/17 0629    Code Status History    Date Active Date Inactive Code Status Order ID Comments User Context   03/21/2017 1813 03/24/2017 2058 Full Code [id number]  Adrian Saran, MD Inpatient   03/13/2017 1004 03/14/2017 1346 Full Code [id number]  Enid Baas, MD Inpatient   03/12/2017 1606 03/13/2017 1004 DNR [id number]  Ihor Austin, MD Inpatient   03/04/2017 1923 03/04/2017 2355 DNR [id number]  Alford Highland, MD ED           Consults pulmonary critical care  DVT Prophylaxis  Lovenox   Lab Results  Component Value Date   PLT 281 03/25/2017     Time Spent in minutes 35 minutes critical care additional time  Greater than 50% of time spent in care coordination and counseling patient regarding the condition and plan of care.   Auburn Bilberry M.D on 03/26/2017 at 4:34 PM  Between 7am to 6pm - Pager - [phone]  After 6pm go  to [email or web address] - Social research officer, government  [employer]   Office  [phone]

## 2017-03-26 NOTE — Progress Notes (Signed)
Patient admitted to ICU 12 at 0630 c/o SOB lungs rhonchus PRN albuterol started patient removed treatment stated it's making him worse. IV hydralazine administered for BP 187/109. Skin intact dusky colored. Patient alert and oriented x3; disoriented to time. Foley catheter in place patient arrived to ED with it per ED nurse.

## 2017-03-26 NOTE — Progress Notes (Signed)
eLink Physician-Brief Progress Note Patient Name: Marco RegisterMichael Masterson DOB: October 30, 1955 MRN: [id number]   Date of Service  03/26/2017  HPI/Events of Note  62 yo male with recent pneumonia and discharged on oral antibiotics which he didn't take. Now returns with HTN and hypoxia. Large new pleural effusion. Being admitted to ICU for agitation, HTN and possible thoracentesis. PCCM asked to assume care. BP = 194/135 (? If that is correct)  eICU Interventions  Will order: 1. Hydralazine 10 mg IV Q 4 hours PRN SBP > 170 or DBP > 100.     Intervention Category Evaluation Type: New Patient Evaluation  Lenell AntuSommer,Chrishauna Mee Eugene 03/26/2017, 6:22 AM

## 2017-03-26 NOTE — Progress Notes (Signed)
*  PRELIMINARY RESULTS* Echocardiogram 2D Echocardiogram has been performed.  Joanette GulaJoan M Zniya Cottone 03/26/2017, 11:13 AM

## 2017-03-26 NOTE — ED Provider Notes (Addendum)
Care signed over from Dr. Darnelle CatalanMalinda.  Briefly the patient is a 62 year old man with a number of comorbidities including cirrhosis and dementia who is brought to the ED by his wife for increasing confusion and SOB.  Recent discharge for HAP, but wife was unable to fill his augmentin script.  Now after IV fluids and IV unasyn he remains tachycardic, tachypnic, and hypoxic.  At this point given his clinical condition I do believe he requires inpatient admission for broad IV antibiotics, IV fluids, and continued resuscitation.  I discussed with his wife who verbalized understanding and agreement with the plan.  I then discussed with Dr. Anne HahnWillis who has graciously agreed to admit the patient to his service.  CRITICAL CARE Performed by: Merrily BrittleNeil Yoali Conry   Total critical care time: 30 minutes  Critical care time was exclusive of separately billable procedures and treating other patients.  Critical care was necessary to treat or prevent imminent or life-threatening deterioration.  Critical care was time spent personally by me on the following activities: development of treatment plan with patient and/or surrogate as well as nursing, discussions with consultants, evaluation of patient's response to treatment, examination of patient, obtaining history from patient or surrogate, ordering and performing treatments and interventions, ordering and review of laboratory studies, ordering and review of radiographic studies, pulse oximetry and re-evaluation of patient's condition.    Merrily Brittleifenbark, Ebonye Reade, MD 03/26/17 0104   ----------------------------------------- 3:51 AM on 03/26/2017 -----------------------------------------  The patient has worsening tachycardia, tachypnea, hypertension, hypoxia.  Dr. Sheryle Haildiamond the admitting hospitalist at bedside is asked me to order a CT angiogram of the chest abdomen pelvis.    Merrily Brittleifenbark, Rudolpho Claxton, MD 03/26/17 [phone]

## 2017-03-26 NOTE — ED Notes (Signed)
Patient assisted onto bedpan by this EDT. Patient attempting to use the bathroom at this time.

## 2017-03-26 NOTE — H&P (Addendum)
Marco [name] is an 62 y.o. male.   Chief Complaint: Shortness of breath HPI: Patient with past medical history of seizure disorder, arthritis and depression/anxiety presents to the emergency department with shortness of breath.  The patient was discharged from the hospital yesterday following treatment for pneumonia.  He was also discharged with an indwelling Foley catheter.  He was unable to purchase his antibiotics following discharge and thus missed 1 dose of his pneumonia treatment.  Upon return to the emergency department the patient rapidly became more agitated and confused.  His oxygen saturations gradually dropped to below 88% requiring 3 L of oxygen via nasal cannula.  He received cefepime and vancomycin in the emergency department but appeared to worsen clinically.  The patient's blood pressure also dramatically elevated to more than 200/100 and heart rate greater than 130.  His respiratory rate also increased to greater than 30.  Nitropaste was applied to his chest and he was given Benadryl due to concern for possible allergic reaction.  The patient's agitation improved and his blood pressure decreased but he remained diaphoretic and short of breath despite good oxygen saturations on supplemental O2.  A CT of his chest and abdomen was obtained to rule out pulmonary embolism or acute intracranial process.  It showed an intralobular thickening and inflammatory pattern along with new pleural effusions bilaterally, right greater than left.  Once he was relatively stabilized the emergency department staff called the hospitalist service for admission.  Past Medical History:  Diagnosis Date  . Anxiety   . Arthritis   . Depression   . Heartburn   . Seizures ([place])     Past Surgical History:  Procedure Laterality Date  . KNEE ARTHROSCOPY  1975    Family History  Problem Relation Age of Onset  . Diabetes Mellitus II Mother   . Breast cancer Mother   . Kidney disease Neg Hx   . Prostate  cancer Neg Hx    Social History:  reports that he has been smoking.  He has been smoking about 1.00 pack per day. He has never used smokeless tobacco. He reports that he drinks about 2.4 oz of alcohol per week. He reports that he does not use drugs.  Allergies:  Allergies  Allergen Reactions  . Hydrocodone-Acetaminophen Nausea Only and Nausea And Vomiting    Prior to Admission medications   Medication Sig Start Date End Date Taking? Authorizing Provider  albuterol (PROVENTIL HFA;VENTOLIN HFA) 108 (90 Base) MCG/ACT inhaler Inhale 2 puffs into the lungs every 6 (six) hours as needed for wheezing or shortness of breath. 03/24/17  Yes Epifanio Lesches, MD  amoxicillin-clavulanate (AUGMENTIN) 875-125 MG tablet Take 1 tablet by mouth 2 (two) times daily. 03/24/17  Yes Epifanio Lesches, MD  cyanocobalamin 2000 MCG tablet Take 1 tablet (2,000 mcg total) by mouth daily. 03/25/17  Yes Epifanio Lesches, MD  feeding supplement, ENSURE ENLIVE, (ENSURE ENLIVE) LIQD Take 237 mLs by mouth 2 (two) times daily between meals. 03/24/17  Yes Epifanio Lesches, MD  levETIRAcetam (KEPPRA) 750 MG tablet Take 1 tablet (750 mg total) by mouth 2 (two) times daily. 03/24/17  Yes Epifanio Lesches, MD  mometasone-formoterol (DULERA) 200-5 MCG/ACT AERO Inhale 2 puffs into the lungs 2 (two) times daily. 03/14/17  Yes Gladstone Lighter, MD  nicotine (NICODERM CQ - DOSED IN MG/24 HOURS) 21 mg/24hr patch Place 1 patch (21 mg total) onto the skin daily. 03/25/17  Yes Mody, Ulice Bold, MD  pantoprazole (PROTONIX) 40 MG tablet Take 1 tablet (40 mg total)  by mouth daily. 03/15/17  Yes Gladstone Lighter, MD  tamsulosin (FLOMAX) 0.4 MG CAPS capsule Take 1 capsule (0.4 mg total) by mouth daily. 03/25/17  Yes Epifanio Lesches, MD     Results for orders placed or performed during the hospital encounter of 03/25/17 (from the past 48 hour(s))  Basic metabolic panel     Status: Abnormal   Collection Time: 03/25/17  8:24 PM   Result Value Ref Range   Sodium 137 135 - 145 mmol/L   Potassium 4.2 3.5 - 5.1 mmol/L   Chloride 107 101 - 111 mmol/L   CO2 21 (L) 22 - 32 mmol/L   Glucose, Bld 97 65 - 99 mg/dL   BUN 16 6 - 20 mg/dL   Creatinine, Ser 0.83 0.61 - 1.24 mg/dL   Calcium 8.8 (L) 8.9 - 10.3 mg/dL   GFR calc non Af Amer >60 >60 mL/min   GFR calc Af Amer >60 >60 mL/min    Comment: (NOTE) The eGFR has been calculated using the CKD EPI equation. This calculation has not been validated in all clinical situations. eGFR's persistently <60 mL/min signify possible Chronic Kidney Disease.    Anion gap 9 5 - 15    Comment: Performed at [facility], [address] [place], [place] [address]  CBC     Status: Abnormal   Collection Time: 03/25/17  8:24 PM  Result Value Ref Range   WBC 11.6 (H) 3.8 - 10.6 K/uL   RBC 3.25 (L) 4.40 - 5.90 MIL/uL   Hemoglobin 10.7 (L) 13.0 - 18.0 g/dL   HCT 32.4 (L) 40.0 - 52.0 %   MCV 99.6 80.0 - 100.0 fL   MCH 33.0 26.0 - 34.0 pg   MCHC 33.1 32.0 - 36.0 g/dL   RDW 14.6 (H) 11.5 - 14.5 %   Platelets 281 150 - 440 K/uL    Comment: Performed at [facility], [address] [place], [place] [address]  Troponin I     Status: Abnormal   Collection Time: 03/25/17  8:24 PM  Result Value Ref Range   Troponin I 0.03 (HH) <0.03 ng/mL    Comment: CRITICAL RESULT CALLED TO, READ BACK BY AND VERIFIED WITH KATE BUMBARNER @ 2105 ON 03/25/2017 BY CAF Performed at [facility], [address] [place], [place] [address]   Troponin I     Status: None   Collection Time: 03/25/17 10:21 PM  Result Value Ref Range   Troponin I <0.03 <0.03 ng/mL    Comment: Performed at [facility], [address], [place], [place] [address]   Dg Chest 2 View  Result Date: 03/25/2017 CLINICAL DATA:  Shortness of Breath EXAM: CHEST - 2 VIEW COMPARISON:  03/23/2017 FINDINGS: Small bilateral pleural effusions. Patchy bilateral airspace opacities are similar to prior study,  compatible with pneumonia. Heart is normal size. IMPRESSION: Bilateral airspace opacities, right greater than left are similar prior study, compatible with multifocal pneumonia. Small bilateral effusions. Electronically Signed   By: Rolm Baptise M.D.   On: 03/25/2017 20:56    Review of Systems  Constitutional: Positive for diaphoresis and malaise/fatigue. Negative for chills and fever.  HENT: Negative for sore throat and tinnitus.   Eyes: Negative for blurred vision and redness.  Respiratory: Positive for shortness of breath. Negative for cough.   Cardiovascular: Negative for chest pain, palpitations, orthopnea and PND.  Gastrointestinal: Negative for abdominal pain, diarrhea, nausea and vomiting.  Genitourinary: Negative for dysuria, frequency and urgency.  Musculoskeletal:  Negative for joint pain and myalgias.  Skin: Negative for rash.       No lesions  Neurological: Negative for speech change, focal weakness and weakness.  Endo/Heme/Allergies: Does not bruise/bleed easily.       No temperature intolerance  Psychiatric/Behavioral: Negative for depression and suicidal ideas.    Blood pressure (!) 178/110, pulse (!) 116, temperature 98.2 F (36.8 C), temperature source Oral, resp. rate (!) 30, height '5\' 5"'  (1.651 m), weight 72.6 kg (160 lb), SpO2 92 %. Physical Exam  Vitals reviewed. Constitutional: He appears well-developed and well-nourished. No distress. Nasal cannula in place.  HENT:  Head: Normocephalic and atraumatic.  Mouth/Throat: Oropharynx is clear and moist.  Eyes: Pupils are equal, round, and reactive to light. Conjunctivae and EOM are normal. No scleral icterus.  Neck: Normal range of motion. Neck supple. No JVD present. No tracheal deviation present. No thyromegaly present.  Cardiovascular: Regular rhythm and normal heart sounds. Tachycardia present. Exam reveals no gallop and no friction rub.  No murmur heard. Respiratory: Tachypnea noted. No respiratory distress. He  has decreased breath sounds in the right lower field. He has rhonchi in the left lower field.  GI: Soft. Bowel sounds are normal. He exhibits no distension. There is no tenderness.  Genitourinary:  Genitourinary Comments: Foley catheter in place  Musculoskeletal: Normal range of motion. He exhibits no edema.  Lymphadenopathy:    He has no cervical adenopathy.  Neurological: He is alert.  Oriented to self and surroundings  Skin: Skin is warm. No rash noted. He is diaphoretic. No erythema.  Psychiatric: He has a normal mood and affect. His speech is normal. Judgment and thought content normal. He is agitated.     Assessment/Plan This is a 62 year old male admitted for pneumonia. 1.  Pneumonia: Healthcare associated; continue cefepime and vancomycin.  I do not believe the patient is having an allergic reaction.  Supplemental O2 as tolerated.  The appearance of his lungs on CT scan indicate possible mineral oil aspiration although the patient's wife states that he has had nothing but water and tea since being home.  This pattern can also be seen in organizing pneumonia or with drug-induced pneumonitis.  Will admit to stepdown unit and will need pulmonary consultation for possible BAL and/or thoracentesis for his pleural effusions. 2.  Sepsis: The patient meets criteria via tachycardia, tachypnea and leukocytosis.  The patient is hemodynamically labile but not hypotensive.  He does not have septic shock.  Follow blood cultures for growth and sensitivities.  Consider BAL. 3.  Seizure disorder: Continue Keppra 4.  BPH: Foley catheter in place; continue tamsulosin 5.  DVT prophylaxis: Lovenox 6.  GI prophylaxis: Pantoprazole per home regimen The patient is a full code.  Time spent on admission orders and critical care approximately 45 minutes. Discussed with E-Link telemedicine  Harrie Foreman, MD 03/26/2017, 5:04 AM

## 2017-03-26 NOTE — ED Notes (Signed)
Dr Lamont Snowballifenbark to bedside. Foley flushed with 50 ml of sterile water. No clots noted. Foley draining normally. Pt continues to be diaphoretic and tachypenic.

## 2017-03-27 ENCOUNTER — Inpatient Hospital Stay: Payer: Medicare Other

## 2017-03-27 DIAGNOSIS — J181 Lobar pneumonia, unspecified organism: Secondary | ICD-10-CM

## 2017-03-27 LAB — BASIC METABOLIC PANEL
Anion gap: 10 (ref 5–15)
BUN: 22 mg/dL — AB (ref 6–20)
CHLORIDE: 108 mmol/L (ref 101–111)
CO2: 22 mmol/L (ref 22–32)
Calcium: 8.6 mg/dL — ABNORMAL LOW (ref 8.9–10.3)
Creatinine, Ser: 0.88 mg/dL (ref 0.61–1.24)
Glucose, Bld: 133 mg/dL — ABNORMAL HIGH (ref 65–99)
POTASSIUM: 3.4 mmol/L — AB (ref 3.5–5.1)
SODIUM: 140 mmol/L (ref 135–145)

## 2017-03-27 LAB — BLOOD GAS, ARTERIAL
Acid-Base Excess: 0.5 mmol/L (ref 0.0–2.0)
Bicarbonate: 24.5 mmol/L (ref 20.0–28.0)
Delivery systems: POSITIVE
EXPIRATORY PAP: 5
FIO2: 0.3
INSPIRATORY PAP: 10
LHR: 12 {breaths}/min
O2 SAT: 98.6 %
PCO2 ART: 36 mmHg (ref 32.0–48.0)
Patient temperature: 36.6
pH, Arterial: 7.45 (ref 7.350–7.450)
pO2, Arterial: 112 mmHg — ABNORMAL HIGH (ref 83.0–108.0)

## 2017-03-27 LAB — MAGNESIUM: MAGNESIUM: 2.2 mg/dL (ref 1.7–2.4)

## 2017-03-27 LAB — CBC WITH DIFFERENTIAL/PLATELET
BASOS ABS: 0 10*3/uL (ref 0–0.1)
BASOS PCT: 0 %
EOS ABS: 0 10*3/uL (ref 0–0.7)
EOS PCT: 0 %
HCT: 28.4 % — ABNORMAL LOW (ref 40.0–52.0)
HEMOGLOBIN: 9.6 g/dL — AB (ref 13.0–18.0)
Lymphocytes Relative: 3 %
Lymphs Abs: 0.2 10*3/uL — ABNORMAL LOW (ref 1.0–3.6)
MCH: 33.4 pg (ref 26.0–34.0)
MCHC: 33.8 g/dL (ref 32.0–36.0)
MCV: 99 fL (ref 80.0–100.0)
Monocytes Absolute: 0.4 10*3/uL (ref 0.2–1.0)
Monocytes Relative: 4 %
NEUTROS PCT: 93 %
Neutro Abs: 7.6 10*3/uL — ABNORMAL HIGH (ref 1.4–6.5)
PLATELETS: 246 10*3/uL (ref 150–440)
RBC: 2.87 MIL/uL — ABNORMAL LOW (ref 4.40–5.90)
RDW: 15.2 % — ABNORMAL HIGH (ref 11.5–14.5)
WBC: 8.2 10*3/uL (ref 3.8–10.6)

## 2017-03-27 LAB — PHOSPHORUS: PHOSPHORUS: 4.2 mg/dL (ref 2.5–4.6)

## 2017-03-27 MED ORDER — METOPROLOL TARTRATE 5 MG/5ML IV SOLN
5.0000 mg | Freq: Four times a day (QID) | INTRAVENOUS | Status: DC | PRN
  Administered 2017-03-27 – 2017-03-29 (×3): 5 mg via INTRAVENOUS
  Filled 2017-03-27 (×3): qty 5

## 2017-03-27 MED ORDER — POTASSIUM CHLORIDE CRYS ER 20 MEQ PO TBCR
30.0000 meq | EXTENDED_RELEASE_TABLET | Freq: Two times a day (BID) | ORAL | Status: AC
  Administered 2017-03-27 (×2): 30 meq via ORAL
  Filled 2017-03-27 (×2): qty 2

## 2017-03-27 MED ORDER — SODIUM CHLORIDE 0.9 % IV SOLN
750.0000 mg | Freq: Two times a day (BID) | INTRAVENOUS | Status: DC
  Administered 2017-03-27 – 2017-03-28 (×2): 750 mg via INTRAVENOUS
  Filled 2017-03-27 (×4): qty 7.5

## 2017-03-27 NOTE — Care Management (Addendum)
Patient has Medicare however he opted out of drug coverage. Medication Management may be able to assist patient with Brand name medications (if pharmaceutical company has provided to [facility]). RNCM to reach out to Christan's phone number is [phone] with [facility] for discharge assistance. PCP is Dr. Burnett ShengHedrick [phone]. Patient was last seen Mar. 21. 2019 (this was follow up from hospital). Appointment made for  April 16 at 11AM.

## 2017-03-27 NOTE — Progress Notes (Signed)
[employer] [facility] [place] at [facility]                                                                                                                                                                                  Patient Demographics   Marco [name], is a 62 y.o. male, DOB - 09/11/1955, ZOX:[id number]  Admit date - 03/25/2017   Admitting Physician Arnaldo Natal, MD  Outpatient Primary MD for the patient is Jerl Mina, MD   LOS - 1  Subjective: Improving. Still SOB at rest  Review of Systems:   Constitutional: Positive for malaise/fatigue and weight loss. Negative for chills and fever.  HENT: Negative.  Negative for ear discharge, ear pain, hearing loss, nosebleeds and sore throat.   Eyes: Negative.  Negative for blurred vision and pain.  Respiratory: Positive for cough and shortness of breath. Negative for hemoptysis and wheezing.   Cardiovascular: Negative.  Negative for chest pain, palpitations and leg swelling.  Gastrointestinal: Negative.  Negative for abdominal pain, blood in stool, diarrhea, nausea and vomiting.  Genitourinary: Negative.  Negative for dysuria.  Musculoskeletal: Negative.  Negative for back pain.  Skin: Negative.   Neurological: Negative for dizziness, tremors, speech change, focal weakness, seizures and headaches.  Endo/Heme/Allergies: Negative.  Does not bruise/bleed easily.  Psychiatric/Behavioral: Positive for memory loss. Negative for depression, hallucinations and suicidal ideas.   Vitals:   Vitals:   03/27/17 1700 03/27/17 1800 03/27/17 1922 03/27/17 2011  BP: (!) 153/90 (!) 163/92 (!) 167/93   Pulse:   (!) 118   Resp: (!) 25 (!) 25 (!) 24   Temp:   98.1 F (36.7 C)   TempSrc:   Oral   SpO2:   97% 97%  Weight:      Height:        Wt Readings from Last 3 Encounters:  03/27/17 75.2 kg (165 lb 12.6 oz)  03/24/17 75 kg (165 lb 6.4 oz)  03/17/17 63.5 kg (140 lb)     Intake/Output Summary (Last 24 hours) at 03/27/2017  2050 Last data filed at 03/27/2017 1633 Gross per 24 hour  Intake 537 ml  Output 2300 ml  Net -1763 ml    Physical Exam:   GENERAL: looking some better, off BiPAP HEAD, EYES, EARS, NOSE AND THROAT: Atraumatic, normocephalic. Extraocular muscles are intact. Pupils equal and reactive to light. Sclerae anicteric. No conjunctival injection. No oro-pharyngeal erythema.  NECK: Supple. There is no jugular venous distention. No bruits, no lymphadenopathy, no thyromegaly.  HEART: Regular rate and rhythm,. No murmurs, no rubs, no clicks.  LUNGS: Decreased breath sounds bilaterally no accessory muscle usage ABDOMEN: Soft, flat, nontender, nondistended. Has good bowel  sounds. No hepatosplenomegaly appreciated.  EXTREMITIES: No evidence of any cyanosis, clubbing, or peripheral edema.  +2 pedal and radial pulses bilaterally.  NEUROLOGIC: Lethargic SKIN: Moist and warm with no rashes appreciated.  Psych: Not anxious, depressed LN: No inguinal LN enlargement    Antibiotics   Anti-infectives (From admission, onward)   Start     Dose/Rate Route Frequency Ordered Stop   03/26/17 1000  ceFEPIme (MAXIPIME) 2 g in sodium chloride 0.9 % 100 mL IVPB     2 g 200 mL/hr over 30 Minutes Intravenous Every 8 hours 03/26/17 0237     03/26/17 0900  vancomycin (VANCOCIN) IVPB 1000 mg/200 mL premix  Status:  Discontinued     1,000 mg 200 mL/hr over 60 Minutes Intravenous Every 12 hours 03/26/17 0410 03/26/17 1029   03/26/17 0115  ceFEPIme (MAXIPIME) 2 g in sodium chloride 0.9 % 100 mL IVPB     2 g 200 mL/hr over 30 Minutes Intravenous  Once 03/26/17 0100 03/26/17 0222   03/26/17 0115  vancomycin (VANCOCIN) IVPB 1000 mg/200 mL premix     1,000 mg 200 mL/hr over 60 Minutes Intravenous  Once 03/26/17 0100 03/26/17 0630   03/26/17 0000  Ampicillin-Sulbactam (UNASYN) 3 g in sodium chloride 0.9 % 100 mL IVPB  Status:  Discontinued     3 g 200 mL/hr over 30 Minutes Intravenous Every 6 hours 03/25/17 2325 03/26/17  0148      Medications   Scheduled Meds: . docusate sodium  100 mg Oral BID  . enoxaparin (LOVENOX) injection  40 mg Subcutaneous Q24H  . folic acid  1 mg Oral Daily  . furosemide  40 mg Intravenous Q12H  . ipratropium-albuterol  3 mL Nebulization Q4H  . levETIRAcetam  750 mg Oral BID  . methylPREDNISolone (SOLU-MEDROL) injection  40 mg Intravenous Q6H  . metoprolol tartrate  12.5 mg Oral BID  . mometasone-formoterol  2 puff Inhalation BID  . multivitamin-lutein  1 capsule Oral Daily  . nicotine  21 mg Transdermal Daily  . pantoprazole  40 mg Oral Daily  . tamsulosin  0.4 mg Oral Daily  . thiamine  100 mg Oral Daily  . cyanocobalamin  2,000 mcg Oral Daily   Continuous Infusions: . ceFEPime (MAXIPIME) IV Stopped (03/27/17 1755)   PRN Meds:.acetaminophen **OR** acetaminophen, albuterol, hydrALAZINE, labetalol, ondansetron **OR** ondansetron (ZOFRAN) IV   Data Review:   Micro Results Recent Results (from the past 240 hour(s))  Culture, blood (Routine x 2)     Status: None   Collection Time: 03/21/17  1:41 PM  Result Value Ref Range Status   Specimen Description BLOOD RIGHT [facility]  Final   Special Requests   Final    BOTTLES DRAWN AEROBIC AND ANAEROBIC Blood Culture adequate volume   Culture   Final    NO GROWTH 5 DAYS Performed at [facility], [address] [place], [place] [address]    Report Status 03/26/2017 FINAL  Final  Culture, blood (Routine x 2)     Status: None   Collection Time: 03/21/17  1:41 PM  Result Value Ref Range Status   Specimen Description BLOOD  LEFT HAND  Final   Special Requests   Final    BOTTLES DRAWN AEROBIC AND ANAEROBIC Blood Culture results may not be optimal due to an inadequate volume of blood received in culture bottles   Culture   Final    NO GROWTH 5 DAYS Performed at [facility], [address] [place], [place] [address]  Report Status 03/26/2017 FINAL  Final  MRSA PCR Screening     Status: None   Collection  Time: 03/21/17  6:21 PM  Result Value Ref Range Status   MRSA by PCR NEGATIVE NEGATIVE Final    Comment:        The GeneXpert MRSA Assay (FDA approved for NASAL specimens only), is one component of a comprehensive MRSA colonization surveillance program. It is not intended to diagnose MRSA infection nor to guide or monitor treatment for MRSA infections. Performed at [facility], [address] [place], [place] [address]   MRSA PCR Screening     Status: None   Collection Time: 03/26/17  6:34 AM  Result Value Ref Range Status   MRSA by PCR NEGATIVE NEGATIVE Final    Comment:        The GeneXpert MRSA Assay (FDA approved for NASAL specimens only), is one component of a comprehensive MRSA colonization surveillance program. It is not intended to diagnose MRSA infection nor to guide or monitor treatment for MRSA infections. Performed at [facility], [address], [place], [place] [address]     Radiology Reports Dg Chest 1 View  Result Date: 03/23/2017 CLINICAL DATA:  Shortness of breath for the past 3 days.  Smoker. EXAM: CHEST  1 VIEW COMPARISON:  03/21/2017. FINDINGS: Interval borderline enlargement of the cardiac silhouette and extensive right lung airspace opacity. The interstitial markings on the left are prominent with faint airspace opacities with mild progression. No pleural fluid seen. Thoracolumbar spine degenerative changes. IMPRESSION: 1. Significant worsening of airspace opacity throughout the right lung, suspicious for pneumonia. 2. Mild increase in prominence of the interstitial markings and faint airspace opacities on the left, also suspicious for pneumonia. 3. Interval borderline cardiomegaly. This is accentuated by the portable AP technique and poor inspiration. Electronically Signed   By: Beckie Salts M.D.   On: 03/23/2017 14:10   Dg Chest 2 View  Result Date: 03/25/2017 CLINICAL DATA:  Shortness of Breath EXAM: CHEST - 2 VIEW COMPARISON:   03/23/2017 FINDINGS: Small bilateral pleural effusions. Patchy bilateral airspace opacities are similar to prior study, compatible with pneumonia. Heart is normal size. IMPRESSION: Bilateral airspace opacities, right greater than left are similar prior study, compatible with multifocal pneumonia. Small bilateral effusions. Electronically Signed   By: Charlett Nose M.D.   On: 03/25/2017 20:56   Dg Chest 2 View  Result Date: 03/21/2017 CLINICAL DATA:  Not feeling well.  Shortness of breath. EXAM: CHEST - 2 VIEW COMPARISON:  03/17/2017. FINDINGS: BILATERAL airspace opacities have worsened RIGHT greater than LEFT since the prior radiograph consistent with pneumonia. No effusion or pneumothorax. No significant lobar atelectasis. Normal heart size. No bony abnormality. Suspected COPD. IMPRESSION: Worsening aeration suggesting BILATERAL pneumonia, worse on the RIGHT. Electronically Signed   By: Elsie Stain M.D.   On: 03/21/2017 14:06   Dg Chest 2 View  Result Date: 03/04/2017 CLINICAL DATA:  Cough EXAM: CHEST  2 VIEW COMPARISON:  11/10/2016 FINDINGS: Normal heart size and mediastinal contours. Borderline hyperinflation. No acute infiltrate or edema. No effusion or pneumothorax. No acute osseous findings. IMPRESSION: Negative chest. Electronically Signed   By: Marnee Spring M.D.   On: 03/04/2017 15:20   Ct Head Wo Contrast  Result Date: 03/17/2017 CLINICAL DATA:  Chest pain starting at 1830 hours with dyspnea. Altered level of consciousness. Patient could not recognize family members. EXAM: CT HEAD WITHOUT CONTRAST TECHNIQUE: Contiguous axial images were obtained from the base of the skull  through the vertex without intravenous contrast. COMPARISON:  03/04/2017 FINDINGS: Brain: Stable superficial and central atrophy with chronic appearing small vessel ischemic disease. No acute intracranial hemorrhage midline shift or edema. No intra-axial mass nor extra-axial fluid collections. Midline fourth ventricle and  basal cisterns. Vascular: No hyperdense vessel sign. Skull: No acute skull fracture or suspicious osseous lesions. Sinuses/Orbits: Stable findings of polypoid mucosal thickening involving the medial right maxillary sinus. Partial opacification of left mastoid air cells also chronic. Other: None IMPRESSION: 1. Stable involutional changes of the brain with small vessel ischemic disease. No acute intracranial abnormality. 2. Stable polypoid opacification medial aspect right maxillary sinus potentially represent small polyp or mucous retention cyst. Chronic left mastoid effusion. Electronically Signed   By: Tollie Eth M.D.   On: 03/17/2017 21:38   Ct Head Wo Contrast  Result Date: 03/04/2017 CLINICAL DATA:  62 year old male with daily alcohol intake. Seizures, on seizure medication. Somnolence and confusion. Initial encounter. EXAM: CT HEAD WITHOUT CONTRAST TECHNIQUE: Contiguous axial images were obtained from the base of the skull through the vertex without intravenous contrast. COMPARISON:  11/11/2013 head CT. FINDINGS: Brain: No intracranial hemorrhage or CT evidence of large acute infarct. Moderate global atrophy. No intracranial mass lesion noted on this unenhanced exam. Vascular: Minimal vascular calcifications Skull: No acute abnormality. Sinuses/Orbits: No acute orbital abnormality. Mild polypoid opacification medial aspect right maxillary sinus. Remainder of visualized paranasal sinuses are clear. Other: Partial opacification left mastoid air cells without obstructing lesion of the eustachian tube noted. IMPRESSION: No acute intracranial abnormality noted. Moderate global atrophy. Polypoid opacification medial aspect right maxillary sinus and partial opacification left mastoid air cells. Electronically Signed   By: Lacy Duverney M.D.   On: 03/04/2017 17:57   Ct Chest W Contrast  Result Date: 03/04/2017 CLINICAL DATA:  Unintended weight loss. Nonlocalized abdominal pain. Recent nausea and vomiting.  History of daily ETOH use and seizures. EXAM: CT CHEST, ABDOMEN, AND PELVIS WITH CONTRAST TECHNIQUE: Multidetector CT imaging of the chest, abdomen and pelvis was performed following the standard protocol during bolus administration of intravenous contrast. CONTRAST:  ISOVUE-300 IOPAMIDOL (ISOVUE-300) INJECTION 61% COMPARISON:  CT AP 08/06/2009 FINDINGS: CT CHEST FINDINGS Cardiovascular: Normal branch pattern of the great vessels. No aortic aneurysm or dissection. No large central pulmonary embolus. Normal sized heart without pericardial effusion. Minimal coronary arteriosclerosis along the circumflex. Mediastinum/Nodes: No enlarged mediastinal, hilar, or axillary lymph nodes. Thyroid gland, trachea, and esophagus demonstrate no significant findings. Lungs/Pleura: Minimal scarring and/or atelectasis at the apices. Faint nonspecific ground-glass opacities in both upper lobes and lingula with subpleural areas of atelectasis and/or scarring in the right middle lobe. No effusion or pneumothorax. Musculoskeletal: Bilateral gynecomastia. CT ABDOMEN PELVIS FINDINGS Hepatobiliary: Subtle surface nodularity of the liver that may reflect morphologic changes of cirrhosis. A pair of adjacent subcentimeter hypodensities in the right hepatic lobe are noted compatible with cysts or hemangiomata. A third similar subcentimeter hypodensity is seen further caudad anteriorly within the right hepatic lobe measuring up to 8 mm. No biliary dilatation. Normal gallbladder. Pancreas: No ductal dilatation, inflammation or mass. Spleen: Normal Adrenals/Urinary Tract: Normal bilateral adrenal glands. Symmetric enhancement of both kidneys without obstructive uropathy. Normal bladder. Stomach/Bowel: Small hiatal hernia. Physiologic distention of the stomach. Mild small bowel distention with fluid with slight fold thickening in the central abdomen compatible with an enteritis. No mechanical source of obstruction is seen. Decompressed  descending colon through rectum. Liquid stool noted within the cecum and ascending colon. No evidence of appendicitis. Vascular/Lymphatic: Aortoiliac and  branch vessel atherosclerosis without aneurysm or dissection. Reproductive: Prostate is unremarkable. Other: No abdominal wall hernia or abnormality. No abdominopelvic ascites. Musculoskeletal: Degenerative disc disease L5-S1. No acute nor suspicious osseous abnormality. IMPRESSION: 1. Nonspecific faint ground-glass opacities are seen in both upper lobes left greater than right as well as lingula. Findings may represent small foci of alveolitis or pneumonitis are nonspecific. 2. Minimal coronary arteriosclerosis. 3. Mild fluid distention of small bowel without mechanical obstruction suspicious for mild small bowel enteritis. 4. Morphologic appearance of cirrhosis. 5. Three subcentimeter hypodensities in the right hepatic lobe are noted too small to further characterize but statistically consistent with cysts or hemangiomata. 6. Degenerative disc disease L5-S1. Electronically Signed   By: Tollie Eth M.D.   On: 03/04/2017 18:02   Ct Angio Chest Pe W/cm &/or Wo Cm  Result Date: 03/26/2017 CLINICAL DATA:  62 year old male with recent discharge from the hospital for pneumonia and sepsis. Worsening of shortness of breath and confusion. EXAM: CT ANGIOGRAPHY CHEST CT ABDOMEN AND PELVIS WITH CONTRAST TECHNIQUE: Multidetector CT imaging of the chest was performed using the standard protocol during bolus administration of intravenous contrast. Multiplanar CT image reconstructions and MIPs were obtained to evaluate the vascular anatomy. Multidetector CT imaging of the abdomen and pelvis was performed using the standard protocol during bolus administration of intravenous contrast. CONTRAST:  ISOVUE-370 IOPAMIDOL (ISOVUE-370) INJECTION 76% COMPARISON:  Chest CT dated 03/04/2017 FINDINGS: CTA CHEST FINDINGS Cardiovascular: There is no cardiomegaly or pericardial  effusion. The thoracic aorta is unremarkable. The origins of the great vessels of the aortic arch appear patent. There is no CT evidence of pulmonary embolism. Mediastinum/Nodes: Bilateral hilar and mediastinal adenopathy, reactive. Right hilar lymph node measures 13 mm in short axis. The esophagus is grossly unremarkable. No mediastinal fluid collection. Lungs/Pleura: There are extensive bilateral patchy ground-glass opacity and interlobular septal thickening with upper lobe predominant and new compared to the prior CT. Differentials include ARDS, bacterial pneumonia, acute interstitial pneumonia, or pulmonary alveolar proteinosis. Other etiologies are not excluded. Clinical correlation and pulmonary consult is advised. There is moderate bilateral pleural effusions, new since the prior CT with associated partial compressive atelectasis of the lower lobes. No pneumothorax. The central airways are patent. Musculoskeletal: There is diffuse subcutaneous edema and anasarca. The osseous structures are intact. Review of the MIP images confirms the above findings. CT ABDOMEN and PELVIS FINDINGS No intra-abdominal free air.  Small ascites. Hepatobiliary: Indeterminate 15 mm hypodense lesion in the right lobe of the liver similar to prior CT. No intrahepatic biliary ductal dilatation. The gallbladder is unremarkable. Pancreas: Unremarkable. No pancreatic ductal dilatation or surrounding inflammatory changes. Spleen: Normal in size without focal abnormality. Adrenals/Urinary Tract: The adrenal glands, kidneys, and the visualized ureters appear unremarkable. The urinary bladder is decompressed around a Foley catheter. Air within the bladder introduced via catheter. Stomach/Bowel: There is no bowel obstruction or active inflammation. Normal appendix. Vascular/Lymphatic: Mild aortoiliac atherosclerotic disease. No portal venous gas. There is no adenopathy. Reproductive: The prostate and seminal vesicles are grossly unremarkable.  Other: Diffuse subcutaneous edema and anasarca. Musculoskeletal: Degenerative changes of the spine. No acute osseous pathology. Review of the MIP images confirms the above findings. IMPRESSION: 1. Diffuse bilateral airspace density and interlobular septal prominence with a "crazy paving" appearance and new since the prior CT. Differentials include ARDS, bacterial pneumonia, acute interstitial pneumonitis, or pulmonary alveolar proteinosis. Clinical correlation and pulmonary consult is advised. 2. Interval development of moderate size bilateral pleural effusions with associated partial compressive atelectasis of the  lower lobes. 3. No CT evidence of pulmonary artery embolus. 4. Small ascites and anasarca. Electronically Signed   By: Elgie Collard M.D.   On: 03/26/2017 05:04   Ct Abdomen Pelvis W Contrast  Result Date: 03/26/2017 CLINICAL DATA:  62 year old male with recent discharge from the hospital for pneumonia and sepsis. Worsening of shortness of breath and confusion. EXAM: CT ANGIOGRAPHY CHEST CT ABDOMEN AND PELVIS WITH CONTRAST TECHNIQUE: Multidetector CT imaging of the chest was performed using the standard protocol during bolus administration of intravenous contrast. Multiplanar CT image reconstructions and MIPs were obtained to evaluate the vascular anatomy. Multidetector CT imaging of the abdomen and pelvis was performed using the standard protocol during bolus administration of intravenous contrast. CONTRAST:  ISOVUE-370 IOPAMIDOL (ISOVUE-370) INJECTION 76% COMPARISON:  Chest CT dated 03/04/2017 FINDINGS: CTA CHEST FINDINGS Cardiovascular: There is no cardiomegaly or pericardial effusion. The thoracic aorta is unremarkable. The origins of the great vessels of the aortic arch appear patent. There is no CT evidence of pulmonary embolism. Mediastinum/Nodes: Bilateral hilar and mediastinal adenopathy, reactive. Right hilar lymph node measures 13 mm in short axis. The esophagus is grossly  unremarkable. No mediastinal fluid collection. Lungs/Pleura: There are extensive bilateral patchy ground-glass opacity and interlobular septal thickening with upper lobe predominant and new compared to the prior CT. Differentials include ARDS, bacterial pneumonia, acute interstitial pneumonia, or pulmonary alveolar proteinosis. Other etiologies are not excluded. Clinical correlation and pulmonary consult is advised. There is moderate bilateral pleural effusions, new since the prior CT with associated partial compressive atelectasis of the lower lobes. No pneumothorax. The central airways are patent. Musculoskeletal: There is diffuse subcutaneous edema and anasarca. The osseous structures are intact. Review of the MIP images confirms the above findings. CT ABDOMEN and PELVIS FINDINGS No intra-abdominal free air.  Small ascites. Hepatobiliary: Indeterminate 15 mm hypodense lesion in the right lobe of the liver similar to prior CT. No intrahepatic biliary ductal dilatation. The gallbladder is unremarkable. Pancreas: Unremarkable. No pancreatic ductal dilatation or surrounding inflammatory changes. Spleen: Normal in size without focal abnormality. Adrenals/Urinary Tract: The adrenal glands, kidneys, and the visualized ureters appear unremarkable. The urinary bladder is decompressed around a Foley catheter. Air within the bladder introduced via catheter. Stomach/Bowel: There is no bowel obstruction or active inflammation. Normal appendix. Vascular/Lymphatic: Mild aortoiliac atherosclerotic disease. No portal venous gas. There is no adenopathy. Reproductive: The prostate and seminal vesicles are grossly unremarkable. Other: Diffuse subcutaneous edema and anasarca. Musculoskeletal: Degenerative changes of the spine. No acute osseous pathology. Review of the MIP images confirms the above findings. IMPRESSION: 1. Diffuse bilateral airspace density and interlobular septal prominence with a "crazy paving" appearance and new  since the prior CT. Differentials include ARDS, bacterial pneumonia, acute interstitial pneumonitis, or pulmonary alveolar proteinosis. Clinical correlation and pulmonary consult is advised. 2. Interval development of moderate size bilateral pleural effusions with associated partial compressive atelectasis of the lower lobes. 3. No CT evidence of pulmonary artery embolus. 4. Small ascites and anasarca. Electronically Signed   By: Elgie Collard M.D.   On: 03/26/2017 05:04   Ct Abdomen Pelvis W Contrast  Result Date: 03/04/2017 CLINICAL DATA:  Unintended weight loss. Nonlocalized abdominal pain. Recent nausea and vomiting. History of daily ETOH use and seizures. EXAM: CT CHEST, ABDOMEN, AND PELVIS WITH CONTRAST TECHNIQUE: Multidetector CT imaging of the chest, abdomen and pelvis was performed following the standard protocol during bolus administration of intravenous contrast. CONTRAST:  ISOVUE-300 IOPAMIDOL (ISOVUE-300) INJECTION 61% COMPARISON:  CT AP 08/06/2009 FINDINGS:  CT CHEST FINDINGS Cardiovascular: Normal branch pattern of the great vessels. No aortic aneurysm or dissection. No large central pulmonary embolus. Normal sized heart without pericardial effusion. Minimal coronary arteriosclerosis along the circumflex. Mediastinum/Nodes: No enlarged mediastinal, hilar, or axillary lymph nodes. Thyroid gland, trachea, and esophagus demonstrate no significant findings. Lungs/Pleura: Minimal scarring and/or atelectasis at the apices. Faint nonspecific ground-glass opacities in both upper lobes and lingula with subpleural areas of atelectasis and/or scarring in the right middle lobe. No effusion or pneumothorax. Musculoskeletal: Bilateral gynecomastia. CT ABDOMEN PELVIS FINDINGS Hepatobiliary: Subtle surface nodularity of the liver that may reflect morphologic changes of cirrhosis. A pair of adjacent subcentimeter hypodensities in the right hepatic lobe are noted compatible with cysts or hemangiomata. A  third similar subcentimeter hypodensity is seen further caudad anteriorly within the right hepatic lobe measuring up to 8 mm. No biliary dilatation. Normal gallbladder. Pancreas: No ductal dilatation, inflammation or mass. Spleen: Normal Adrenals/Urinary Tract: Normal bilateral adrenal glands. Symmetric enhancement of both kidneys without obstructive uropathy. Normal bladder. Stomach/Bowel: Small hiatal hernia. Physiologic distention of the stomach. Mild small bowel distention with fluid with slight fold thickening in the central abdomen compatible with an enteritis. No mechanical source of obstruction is seen. Decompressed descending colon through rectum. Liquid stool noted within the cecum and ascending colon. No evidence of appendicitis. Vascular/Lymphatic: Aortoiliac and branch vessel atherosclerosis without aneurysm or dissection. Reproductive: Prostate is unremarkable. Other: No abdominal wall hernia or abnormality. No abdominopelvic ascites. Musculoskeletal: Degenerative disc disease L5-S1. No acute nor suspicious osseous abnormality. IMPRESSION: 1. Nonspecific faint ground-glass opacities are seen in both upper lobes left greater than right as well as lingula. Findings may represent small foci of alveolitis or pneumonitis are nonspecific. 2. Minimal coronary arteriosclerosis. 3. Mild fluid distention of small bowel without mechanical obstruction suspicious for mild small bowel enteritis. 4. Morphologic appearance of cirrhosis. 5. Three subcentimeter hypodensities in the right hepatic lobe are noted too small to further characterize but statistically consistent with cysts or hemangiomata. 6. Degenerative disc disease L5-S1. Electronically Signed   By: Tollie Eth M.D.   On: 03/04/2017 18:02   Dg Chest Port 1 View  Result Date: 03/27/2017 CLINICAL DATA:  Clinically suspected pneumonia. EXAM: PORTABLE CHEST 1 VIEW COMPARISON:  Portable chest x-ray of March 26, 2017 FINDINGS: The lungs are well-expanded. The  interstitial markings remain increased diffusely. Patchy airspace opacity is noted especially on the right. The heart is normal in size. The pulmonary vascularity is indistinct. The mediastinum is normal in width. There is no pleural effusion. The bony thorax is unremarkable. IMPRESSION: Persistent confluent interstitial and some airspace opacity bilaterally. Findings are consistent with pneumonia, edema, or ARDS in the appropriate clinical setting. Electronically Signed   By: David  [place] M.D.   On: 03/27/2017 14:00   Dg Chest Port 1 View  Result Date: 03/26/2017 CLINICAL DATA:  Pneumonia and sepsis EXAM: PORTABLE CHEST 1 VIEW COMPARISON:  03/25/2017, 03/23/2017, 03/17/2017, CT chest 03/04/2017, 03/26/2017 FINDINGS: Fairly extensive diffuse bilateral interstitial and ground-glass opacity. Small pleural effusions. Stable cardiomediastinal silhouette. No pneumothorax. IMPRESSION: Extensive interstitial and ground-glass opacity which may reflect edema, diffuse pneumonia, or ARDS. Moderate pleural effusions on CT are not well demonstrated on portable view of the chest. Electronically Signed   By: Jasmine Pang M.D.   On: 03/26/2017 18:30   Dg Chest Portable 1 View  Result Date: 03/17/2017 CLINICAL DATA:  Chest pain starting at 1830 hours. EXAM: PORTABLE CHEST 1 VIEW COMPARISON:  03/04/2017 CXR FINDINGS: Portable AP semi upright  view. Heart size is mildly enlarged but this may be due to the portable technique and slightly low lung volumes. No overt pulmonary edema, pneumonic consolidation or effusion. There is bibasilar as well as right upper lobe atelectasis. No acute nor suspicious osseous abnormalities. IMPRESSION: Bibasilar and right upper lobe atelectasis. Borderline cardiomegaly. Slightly low lung volumes. Electronically Signed   By: Tollie Ethavid  Kwon M.D.   On: 03/17/2017 21:33     CBC Recent Labs  Lab 03/21/17 1338 03/22/17 0408 03/25/17 2024 03/27/17 0629  WBC 15.4* 9.2 11.6* 8.2  HGB 15.1 12.4*  10.7* 9.6*  HCT 45.1 35.9* 32.4* 28.4*  PLT 259 187 281 246  MCV 98.9 97.8 99.6 99.0  MCH 33.2 33.7 33.0 33.4  MCHC 33.6 34.5 33.1 33.8  RDW 14.9* 14.4 14.6* 15.2*  LYMPHSABS 0.7*  --   --  0.2*  MONOABS 0.7  --   --  0.4  EOSABS 0.0  --   --  0.0  BASOSABS 0.0  --   --  0.0    Chemistries  Recent Labs  Lab 03/21/17 1338 03/22/17 0408 03/22/17 1521 03/23/17 0733 03/24/17 0612 03/25/17 2024 03/26/17 0520 03/27/17 0629  NA 128* 130*  --  136 136 137  --  140  K 3.3* 3.3*  --  3.9 4.3 4.2  --  3.4*  CL 99* 107  --  113* 112* 107  --  108  CO2 15* 17*  --  18* 18* 21*  --  22  GLUCOSE 117* 80  --  83 136* 97  --  133*  BUN 10 11  --  12 15 16   --  22*  CREATININE 1.08 0.98  --  0.97 0.91 0.83  --  0.88  CALCIUM 8.6* 7.5*  --  8.1* 8.4* 8.8*  --  8.6*  MG  --   --  1.8 1.9 2.0  --   --  2.2  AST 40  --   --   --   --   --  108*  --   ALT 14*  --   --   --   --   --  36  --   ALKPHOS 114  --   --   --   --   --  212*  --   BILITOT 1.8*  --   --   --   --   --  1.4*  --    ------------------------------------------------------------------------------------------------------------------ estimated creatinine clearance is 79.5 mL/min (by C-G formula based on SCr of 0.88 mg/dL). ------------------------------------------------------------------------------------------------------------------ No results for input(s): HGBA1C in the last 72 hours. ------------------------------------------------------------------------------------------------------------------ No results for input(s): CHOL, HDL, LDLCALC, TRIG, CHOLHDL, LDLDIRECT in the last 72 hours. ------------------------------------------------------------------------------------------------------------------ Recent Labs    03/26/17 0631  TSH 2.056   ------------------------------------------------------------------------------------------------------------------ No results for input(s): VITAMINB12, FOLATE, FERRITIN, TIBC,  IRON, RETICCTPCT in the last 72 hours.  Coagulation profile Recent Labs  Lab 03/21/17 1338 03/26/17 0631  INR 1.31 1.18    No results for input(s): DDIMER in the last 72 hours.  Cardiac Enzymes Recent Labs  Lab 03/21/17 1338 03/25/17 2024 03/25/17 2221  TROPONINI <0.03 0.03* <0.03   ------------------------------------------------------------------------------------------------------------------ Invalid input(s): POCBNP    Assessment & Plan   This is a 62 year old male admitted for pneumonia.  1.    Acute hypoxic respiratory failure due to B/L pneumonia - Likely chronic aspiration, on cefepime  2. Acute respiratory failure  With ARDS from persistent bilateral HCAP /Aspiration pneumonia- much improved now on  [place] O2 - on solumedrol 3. Sepsis: present on admission, improving on Abx 4. Uncontrolled hypertension 5.  Hx of seizure disorder 6. Hx Nicotine dependency        Code Status Orders  (From admission, onward)        Start     Ordered   03/26/17 0630  Full code  Continuous     03/26/17 0629    Code Status History    Date Active Date Inactive Code Status Order ID Comments User Context   03/21/2017 1813 03/24/2017 2058 Full Code [id number]  Adrian Saran, MD Inpatient   03/13/2017 1004 03/14/2017 1346 Full Code [id number]  Enid Baas, MD Inpatient   03/12/2017 1606 03/13/2017 1004 DNR [id number]  Ihor Austin, MD Inpatient   03/04/2017 1923 03/04/2017 2355 DNR [id number]  Alford Highland, MD ED           Consults pulmonary critical care  DVT Prophylaxis  Lovenox   Lab Results  Component Value Date   PLT 246 03/27/2017     Time Spent in minutes 15 minutes   Greater than 50% of time spent in care coordination and counseling patient regarding the condition and plan of care.   Delfino Lovett M.D on 03/27/2017 at 8:50 PM  Between 7am to 6pm - Pager - [phone]  After 6pm go to [email or web address] - Social research officer, government  [employer]   Office   [phone]

## 2017-03-27 NOTE — Progress Notes (Signed)
MD notified of pt NPO status and PO meds. Orders to change keppra dose to IV and add metoprolol iv prn. MD is aware of existing labetolol order, okay to have both options on the mar.

## 2017-03-27 NOTE — Progress Notes (Signed)
Report given to Abby on 2C, pt will transport with 2 liters portable on 2C bed, no pain issues, VSS on 2 liters, continues with AFib, chart, meds and belongings will transfer with him.

## 2017-03-27 NOTE — Consult Note (Signed)
[facility] [place] Pulmonary Medicine Consultation      Name: Marco [name] MRN: [id number] DOB: 08-Oct-1955    ADMISSION DATE:  03/25/2017 CONSULTATION DATE:  03/26/2017  REFERRING MD :  Arnaldo Natal MD   CHIEF COMPLAINT:     Shortness of breath   HISTORY OF PRESENT ILLNESS   Patient with past medical history of seizure disorder, arthritis and depression/anxiety presents to the emergency department with shortness of breath.  The patient was discharged from the hospital yesterday following treatment for pneumonia.  He was also discharged with an indwelling Foley catheter.  He was unable to purchase his antibiotics following discharge and thus missed 1 dose of his pneumonia treatment.  Upon return to the emergency department the patient rapidly became more agitated and confused.  His oxygen saturations gradually dropped to below 88% requiring 3 L of oxygen via nasal cannula.  He received cefepime and vancomycin in the emergency department but appeared to worsen clinically.  The patient's blood pressure also dramatically elevated to more than 200/100 and heart rate greater than 130.  His respiratory rate also increased to greater than 30.  Nitropaste was applied to his chest and he was given Benadryl due to concern for possible allergic reaction.  The patient's agitation improved and his blood pressure decreased but he remained diaphoretic and short of breath despite good oxygen saturations on supplemental O2.  A CT of his chest and abdomen was obtained to rule out pulmonary embolism or acute intracranial process.  It showed an intralobular thickening and inflammatory pattern along with new pleural effusions bilaterally, right greater than left.  He was therefore admitted to the ICU    SIGNIFICANT EVENTS   none        VITAL SIGNS    Temp:  [97.4 F (36.3 C)-98.4 F (36.9 C)] 98.4 F (36.9 C) (03/27 1200) Pulse Rate:  [85-125] 110 (03/27 1200) Resp:  [15-29] 27 (03/27 1200) BP:  (127-174)/(71-112) 152/87 (03/27 1200) SpO2:  [95 %-100 %] 97 % (03/27 1200) FiO2 (%):  [30 %-50 %] 30 % (03/27 0800) Weight:  [165 lb 12.6 oz (75.2 kg)] 165 lb 12.6 oz (75.2 kg) (03/27 0354) HEMODYNAMICS:   VENTILATOR SETTINGS: FiO2 (%):  [30 %-50 %] 30 %   Now on 2 liters [place]  INTAKE / OUTPUT:  Intake/Output Summary (Last 24 hours) at 03/27/2017 1223 Last data filed at 03/27/2017 1200 Gross per 24 hour  Intake 437 ml  Output 2750 ml  Net -2313 ml       PHYSICAL EXAM       HEENT: Normocephalic and atraumatic.  Mouth/Throat: Oropharynx is clear and moist.  Eyes: Pupils are equal, round, and reactive to light. Conjunctivae and EOM are normal. No scleral icterus.  Neck: Normal range of motion. Neck supple. No JVD present. No tracheal deviation present. No thyromegaly present.  Cardiovascular: Regular rhythm and normal heart sounds. Tachycardia . Exam reveals no gallop and no friction rub.  No murmur heard. Respiratory: ocassional rhonchi, normal effort  GI: Soft. Bowel sounds are normal. He exhibits no distension. There is no tenderness.  Genitourinary:  Genitourinary Comments: Foley catheter in place  Musculoskeletal: Normal range of motion. He exhibits no edema.  Lymphadenopathy:    He has no cervical adenopathy.  Neurological: He is AAO x 3 with a tremour Oriented to self and surroundings  Skin: Skin is warm. No rash noted.  Psychiatric: He has a normal mood and affect. His speech is normal. Judgment and thought content normal.  LABS   LABS:  CBC Recent Labs  Lab 03/22/17 0408 03/25/17 2024 03/27/17 0629  WBC 9.2 11.6* 8.2  HGB 12.4* 10.7* 9.6*  HCT 35.9* 32.4* 28.4*  PLT 187 281 246   Coag's Recent Labs  Lab 03/21/17 1338 03/26/17 0631  INR 1.31 1.18   BMET Recent Labs  Lab 03/24/17 0612 03/25/17 2024 03/27/17 0629  NA 136 137 140  K 4.3 4.2 3.4*  CL 112* 107 108  CO2 18* 21* 22  BUN 15 16 22*  CREATININE 0.91 0.83 0.88  GLUCOSE  136* 97 133*   Electrolytes Recent Labs  Lab 03/23/17 0733 03/24/17 0612 03/25/17 2024 03/27/17 0629  CALCIUM 8.1* 8.4* 8.8* 8.6*  MG 1.9 2.0  --  2.2  PHOS 2.9 3.7  --  4.2   Sepsis Markers Recent Labs  Lab 03/21/17 1338 03/21/17 1645 03/26/17 0631  LATICACIDVEN 3.6* 3.5*  --   PROCALCITON  --   --  0.36   ABG Recent Labs  Lab 03/27/17 0520  PHART 7.45  PCO2ART 36  PO2ART 112*   Liver Enzymes Recent Labs  Lab 03/21/17 1338 03/26/17 0520  AST 40 108*  ALT 14* 36  ALKPHOS 114 212*  BILITOT 1.8* 1.4*  ALBUMIN 2.6* 2.1*   Cardiac Enzymes Recent Labs  Lab 03/21/17 1338 03/25/17 2024 03/25/17 2221  TROPONINI <0.03 0.03* <0.03   Glucose Recent Labs  Lab 03/26/17 0621  GLUCAP 120*        IMAGING    Dg Chest Port 1 View  Result Date: 03/26/2017 CLINICAL DATA:  Pneumonia and sepsis EXAM: PORTABLE CHEST 1 VIEW COMPARISON:  03/25/2017, 03/23/2017, 03/17/2017, CT chest 03/04/2017, 03/26/2017 FINDINGS: Fairly extensive diffuse bilateral interstitial and ground-glass opacity. Small pleural effusions. Stable cardiomediastinal silhouette. No pneumothorax. IMPRESSION: Extensive interstitial and ground-glass opacity which may reflect edema, diffuse pneumonia, or ARDS. Moderate pleural effusions on CT are not well demonstrated on portable view of the chest. Electronically Signed   By: Jasmine PangKim  Fujinaga M.D.   On: 03/26/2017 18:30                         MAJOR EVENTS/TEST RESULTS:   INDWELLING DEVICES::  MICRO DATA: MRSA PCR - negative Urine  Blood- negative Resp   ANTIMICROBIALS:  3/26 Cefepime, vancomycin   ASSESSMENT/PLAN   1. Bilateral Pneumonia- HCAP. Likely chronic aspiration as patient's wife now admits that the patient has been coughing each time he eats. 2. Acute respiratory failure  With ARDS from persistent bilateral HCAP /Aspiration pneumonia- much improved now on less FIO2 requirement.  3. Hypokalemia 4. Uncontrolled  hypertension 5.  Hx of seizure disorder 6. Hx Nicotine dependency 7. Hx of Alcoholism but has not drank in > 1 month.  Plan 1. Continue with ABX, will try to get sputum culture 2. Continue on [place] oxygen 3. Will continue on Solumedrol 4. GI and DVT prophylaxis 5. Thiamine, folic acid, MVI, electrolytes replacement 6. Incentive spirometry 7. Continue on Keppra, check levels 8. Out of bed 9. Speech evaluation  Family: wife and brother updated  Disp: out of bed; may transfer out of ICU      Jackson LatinoKarol Shanikia Kernodle, M.D

## 2017-03-27 NOTE — Progress Notes (Signed)
Initial Nutrition Assessment  DOCUMENTATION CODES:   Non-severe (moderate) malnutrition in context of chronic illness  INTERVENTION:  Once diet able to be advanced recommend providing Ensure Enlive po TID, each supplement provides 350 kcal and 20 grams of protein. Patient prefers chocolate but is also open to trying strawberry.  Also recommend Magic cup TID with meals once diet able to be advanced, each supplement provides 290 kcal and 9 grams of protein.  Provided wife with handout on more affordable oral nutrition supplement options for home. Carnation Breakfast Essentials in whole milk provides 280 kcal and 13 grams of protein per serving. Homemade protein shake (1 cup whole milk + 1/4 cup powdered milk + 2 tbsp chocolate syrup) provides 416 kcal and 17 grams of protein per serving.  Would recommend changing Ocuvite to regular MVI. Continue thiamine 902 mg and folic acid 1 mg daily in setting of hx of EtOH abuse.  Patient is at risk of vitamin C deficiency so recommend providing vitamin C 250 mg BID.  NUTRITION DIAGNOSIS:   Moderate Malnutrition related to chronic illness(EtOH abuse) as evidenced by mild fat depletion, mild muscle depletion.  GOAL:   Patient will meet greater than or equal to 90% of their needs  MONITOR:   PO intake, Supplement acceptance, Diet advancement, Labs, Weight trends, I & O's  REASON FOR ASSESSMENT:   Malnutrition Screening Tool    ASSESSMENT:   62 year old male with PMHx of anxiety, depression, seizures, EtOH abuse who is now re-admitted with shortness of breath following recent admission for PNA where he was also discharged home with indwelling Foley catheter, now admitted with bilateral PNA.   -Patient now made NPO pending SLP evaluation.  Met with patient, his wife, and his brother at bedside. Patient's wife provided most of history. She reports patient has had a poor appetite for the past month. He goes days without eating at a tie. When he  does have meals he will only take bites. He has really only been attempting to eat mashed potatoes at home lately. He does not drink ONS at home, but has already been started on Ensure here and drank one bottle this morning. He also does not take any vitamins/minerals at home. He was still smoking up until this admission.  Wife reports his UBW was 160 lbs and he had lost down to 142 lbs over approximately 3 months. The weights in chart of 140-142 lbs appear to be stated and not true measured weights. He was 160.7 lbs on 06/08/2015, 152.6 lbs on 03/22/2017, 163.6 lbs on 03/26/2017, and is currently 165.8 lbs.  Meal Completion: 25% of breakfast this morning  Medications reviewed and include: Colace, folic acid 1 mg daily, Lasix 40 mg Q12hrs IV, Keppra, Solu-Medrol 40 mg Q6hrs IV, Ocuvite 1 capsule daily PO, pantoprazole, potassium chloride 30 mEq BID, Flomax, thiamine 100 mg daily, vitamin B12 2000 micrograms daily, cefepime.  Labs reviewed: Potassium 3.4, BUN 22.  Discussed with RN and on rounds. Patient began coughing while drinking water after breakfast.  NUTRITION - FOCUSED PHYSICAL EXAM:    Most Recent Value  Orbital Region  Mild depletion  Upper Arm Region  Moderate depletion  Thoracic and Lumbar Region  Mild depletion  Buccal Region  Mild depletion  Temple Region  Moderate depletion  Clavicle Bone Region  Mild depletion  Clavicle and Acromion Bone Region  Mild depletion  Scapular Bone Region  Mild depletion  Dorsal Hand  Mild depletion  Patellar Region  Mild depletion  Anterior Thigh Region  Mild depletion  Posterior Calf Region  Moderate depletion  Edema (RD Assessment)  Mild [feet]  Hair  Reviewed  Eyes  Unable to assess  Mouth  Unable to assess  Skin  Reviewed [ecchymosis]  Nails  Reviewed     Diet Order:  Diet NPO time specified  EDUCATION NEEDS:   Education needs have been addressed  Skin:  Skin Assessment: Reviewed RN Assessment(ecchymosis to bilateral arms and  legs)  Last BM:  PTA (03/25/2017 per chart)  Height:   Ht Readings from Last 1 Encounters:  03/26/17 _0  (1.676 m)    Weight:   Wt Readings from Last 1 Encounters:  03/27/17 165 lb 12.6 oz (75.2 kg)    Ideal Body Weight:  64.5 kg  BMI:  Body mass index is 26.76 kg/m.  Estimated Nutritional Needs:   Kcal:  1805-2105 (MSJ x 1.2-1.4)  Protein:  90-105 grams (1.2-1.4 grams/kg)  Fluid:  1.8-2.1 L/day (1 mL/kcal)  Willey Blade, MS, RD, LDN Office: [phone] Pager: [phone] After Hours/Weekend Pager: [phone]

## 2017-03-28 ENCOUNTER — Inpatient Hospital Stay: Payer: Medicare Other

## 2017-03-28 LAB — CBC WITH DIFFERENTIAL/PLATELET
BASOS ABS: 0 10*3/uL (ref 0–0.1)
BASOS PCT: 0 %
EOS PCT: 0 %
Eosinophils Absolute: 0 10*3/uL (ref 0–0.7)
HCT: 30 % — ABNORMAL LOW (ref 40.0–52.0)
Hemoglobin: 9.9 g/dL — ABNORMAL LOW (ref 13.0–18.0)
Lymphocytes Relative: 2 %
Lymphs Abs: 0.2 10*3/uL — ABNORMAL LOW (ref 1.0–3.6)
MCH: 33.2 pg (ref 26.0–34.0)
MCHC: 32.9 g/dL (ref 32.0–36.0)
MCV: 101 fL — ABNORMAL HIGH (ref 80.0–100.0)
MONO ABS: 0.4 10*3/uL (ref 0.2–1.0)
Monocytes Relative: 4 %
Neutro Abs: 10.2 10*3/uL — ABNORMAL HIGH (ref 1.4–6.5)
Neutrophils Relative %: 94 %
PLATELETS: 282 10*3/uL (ref 150–440)
RBC: 2.97 MIL/uL — ABNORMAL LOW (ref 4.40–5.90)
RDW: 15.3 % — AB (ref 11.5–14.5)
WBC: 10.9 10*3/uL — ABNORMAL HIGH (ref 3.8–10.6)

## 2017-03-28 LAB — BASIC METABOLIC PANEL
Anion gap: 9 (ref 5–15)
BUN: 32 mg/dL — AB (ref 6–20)
CALCIUM: 9 mg/dL (ref 8.9–10.3)
CO2: 23 mmol/L (ref 22–32)
CREATININE: 1.02 mg/dL (ref 0.61–1.24)
Chloride: 111 mmol/L (ref 101–111)
GFR calc Af Amer: >60 mL/min (ref >60)
Glucose, Bld: 123 mg/dL — ABNORMAL HIGH (ref 65–99)
Potassium: 4.2 mmol/L (ref 3.5–5.1)
Sodium: 143 mmol/L (ref 135–145)

## 2017-03-28 LAB — MAGNESIUM: Magnesium: 2.5 mg/dL — ABNORMAL HIGH (ref 1.7–2.4)

## 2017-03-28 LAB — PHOSPHORUS: PHOSPHORUS: 4.2 mg/dL (ref 2.5–4.6)

## 2017-03-28 LAB — PROCALCITONIN: Procalcitonin: 0.43 ng/mL

## 2017-03-28 MED ORDER — ADULT MULTIVITAMIN W/MINERALS CH
1.0000 | ORAL_TABLET | Freq: Every day | ORAL | Status: DC
  Administered 2017-03-29 – 2017-04-01 (×4): 1 via ORAL
  Filled 2017-03-28 (×4): qty 1

## 2017-03-28 MED ORDER — FUROSEMIDE 10 MG/ML IJ SOLN
40.0000 mg | INTRAMUSCULAR | Status: AC
Start: 2017-03-28 — End: 2017-03-28
  Administered 2017-03-28: 40 mg via INTRAVENOUS
  Filled 2017-03-28: qty 4

## 2017-03-28 MED ORDER — ENSURE ENLIVE PO LIQD
237.0000 mL | Freq: Three times a day (TID) | ORAL | Status: DC
  Administered 2017-03-28 – 2017-04-01 (×9): 237 mL via ORAL

## 2017-03-28 MED ORDER — LEVALBUTEROL HCL 0.63 MG/3ML IN NEBU
0.6300 mg | INHALATION_SOLUTION | Freq: Four times a day (QID) | RESPIRATORY_TRACT | Status: DC | PRN
  Filled 2017-03-28: qty 3

## 2017-03-28 MED ORDER — VITAMIN C 500 MG PO TABS
250.0000 mg | ORAL_TABLET | Freq: Two times a day (BID) | ORAL | Status: DC
  Administered 2017-03-28 – 2017-04-01 (×9): 250 mg via ORAL
  Filled 2017-03-28 (×10): qty 0.5

## 2017-03-28 MED ORDER — LEVETIRACETAM 750 MG PO TABS
750.0000 mg | ORAL_TABLET | Freq: Two times a day (BID) | ORAL | Status: DC
  Administered 2017-03-28 – 2017-04-01 (×8): 750 mg via ORAL
  Filled 2017-03-28 (×9): qty 1

## 2017-03-28 MED ORDER — PANTOPRAZOLE SODIUM 40 MG IV SOLR
40.0000 mg | INTRAVENOUS | Status: DC
  Administered 2017-03-28 – 2017-03-29 (×2): 40 mg via INTRAVENOUS
  Filled 2017-03-28 (×2): qty 40

## 2017-03-28 MED ORDER — BUDESONIDE 0.25 MG/2ML IN SUSP
0.2500 mg | Freq: Two times a day (BID) | RESPIRATORY_TRACT | Status: DC
  Administered 2017-03-28 – 2017-04-01 (×9): 0.25 mg via RESPIRATORY_TRACT
  Filled 2017-03-28 (×9): qty 2

## 2017-03-28 MED ORDER — LEVALBUTEROL HCL 1.25 MG/0.5ML IN NEBU: 1.2500 mg | INHALATION_SOLUTION | Freq: Four times a day (QID) | RESPIRATORY_TRACT | Status: DC | PRN

## 2017-03-28 NOTE — Progress Notes (Signed)
Per Dr. Allena KatzPatel okay to renew telemetry order at this time.

## 2017-03-28 NOTE — Progress Notes (Addendum)
[employer] [facility] [place] [facility]                                                                                                                                                                                  Patient Demographics   Marco [name], is a 62 y.o. male, DOB - 07-28-1955, ZOX:[id number]  Admit date - 03/25/2017   Admitting Physician Arnaldo Natal, MD  Outpatient Primary MD for the patient is Jerl Mina, MD   LOS - 2  Subjective: Patient feeling much better, still very short of breath  Review of Systems:   Constitutional: Positive for malaise/fatigue and weight loss. Negative for chills and fever.  HENT: Negative.  Negative for ear discharge, ear pain, hearing loss, nosebleeds and sore throat.   Eyes: Negative.  Negative for blurred vision and pain.  Respiratory: Positive for cough and shortness of breath. Negative for hemoptysis and wheezing.   Cardiovascular: Negative.  Negative for chest pain, palpitations and leg swelling.  Gastrointestinal: Negative.  Negative for abdominal pain, blood in stool, diarrhea, nausea and vomiting.  Genitourinary: Negative.  Negative for dysuria.  Musculoskeletal: Negative.  Negative for back pain.  Skin: Negative.   Neurological: Negative for dizziness, tremors, speech change, focal weakness, seizures and headaches.  Endo/Heme/Allergies: Negative.  Does not bruise/bleed easily.  Psychiatric/Behavioral: Positive for memory loss. Negative for depression, hallucinations and suicidal ideas.   Vitals:   Vitals:   03/28/17 0715 03/28/17 0752 03/28/17 1158 03/28/17 1241  BP: 111/84   (!) 155/82  Pulse: 83   (!) 106  Resp:    (!) 22  Temp:    (!) 97 F (36.1 C)  TempSrc:    Axillary  SpO2:  100% 100% 98%  Weight:      Height:        Wt Readings from Last 3 Encounters:  03/28/17 155 lb 13.8 oz (70.7 kg)  03/24/17 165 lb 6.4 oz (75 kg)  03/17/17 140 lb (63.5 kg)     Intake/Output Summary (Last 24 hours)  at 03/28/2017 1301 Last data filed at 03/28/2017 1104 Gross per 24 hour  Intake 250 ml  Output 2625 ml  Net -2375 ml    Physical Exam:   GENERAL: looking some better, off BiPAP HEAD, EYES, EARS, NOSE AND THROAT: Atraumatic, normocephalic. Extraocular muscles are intact. Pupils equal and reactive to light. Sclerae anicteric. No conjunctival injection. No oro-pharyngeal erythema.  NECK: Supple. There is no jugular venous distention. No bruits, no lymphadenopathy, no thyromegaly.  HEART: Regular rate and rhythm,. No murmurs, no rubs, no clicks.  LUNGS: Decreased breath sounds bilaterally no accessory muscle usage ABDOMEN: Soft, flat, nontender, nondistended. Has good  bowel sounds. No hepatosplenomegaly appreciated.  EXTREMITIES: No evidence of any cyanosis, clubbing, or peripheral edema.  +2 pedal and radial pulses bilaterally.  NEUROLOGIC: Lethargic SKIN: Moist and warm with no rashes appreciated.  Psych: Not anxious, depressed LN: No inguinal LN enlargement    Antibiotics   Anti-infectives (From admission, onward)   Start     Dose/Rate Route Frequency Ordered Stop   03/26/17 1000  ceFEPIme (MAXIPIME) 2 g in sodium chloride 0.9 % 100 mL IVPB     2 g 200 mL/hr over 30 Minutes Intravenous Every 8 hours 03/26/17 0237     03/26/17 0900  vancomycin (VANCOCIN) IVPB 1000 mg/200 mL premix  Status:  Discontinued     1,000 mg 200 mL/hr over 60 Minutes Intravenous Every 12 hours 03/26/17 0410 03/26/17 1029   03/26/17 0115  ceFEPIme (MAXIPIME) 2 g in sodium chloride 0.9 % 100 mL IVPB     2 g 200 mL/hr over 30 Minutes Intravenous  Once 03/26/17 0100 03/26/17 0222   03/26/17 0115  vancomycin (VANCOCIN) IVPB 1000 mg/200 mL premix     1,000 mg 200 mL/hr over 60 Minutes Intravenous  Once 03/26/17 0100 03/26/17 0630   03/26/17 0000  Ampicillin-Sulbactam (UNASYN) 3 g in sodium chloride 0.9 % 100 mL IVPB  Status:  Discontinued     3 g 200 mL/hr over 30 Minutes Intravenous Every 6 hours 03/25/17 2325  03/26/17 0148      Medications   Scheduled Meds: . budesonide (PULMICORT) nebulizer solution  0.25 mg Nebulization BID  . docusate sodium  100 mg Oral BID  . enoxaparin (LOVENOX) injection  40 mg Subcutaneous Q24H  . folic acid  1 mg Oral Daily  . furosemide  40 mg Intravenous Q12H  . ipratropium-albuterol  3 mL Nebulization Q4H  . methylPREDNISolone (SOLU-MEDROL) injection  40 mg Intravenous Q6H  . metoprolol tartrate  12.5 mg Oral BID  . multivitamin-lutein  1 capsule Oral Daily  . nicotine  21 mg Transdermal Daily  . pantoprazole (PROTONIX) IV  40 mg Intravenous Q24H  . tamsulosin  0.4 mg Oral Daily  . thiamine  100 mg Oral Daily  . cyanocobalamin  2,000 mcg Oral Daily   Continuous Infusions: . ceFEPime (MAXIPIME) IV 2 g (03/28/17 1257)  . levETIRAcetam Stopped (03/28/17 1131)   PRN Meds:.acetaminophen **OR** acetaminophen, hydrALAZINE, labetalol, levalbuterol, metoprolol tartrate, ondansetron **OR** ondansetron (ZOFRAN) IV   Data Review:   Micro Results Recent Results (from the past 240 hour(s))  Culture, blood (Routine x 2)     Status: None   Collection Time: 03/21/17  1:41 PM  Result Value Ref Range Status   Specimen Description BLOOD RIGHT [facility]  Final   Special Requests   Final    BOTTLES DRAWN AEROBIC AND ANAEROBIC Blood Culture adequate volume   Culture   Final    NO GROWTH 5 DAYS Performed at [facility], [address] [place], [place] [address]    Report Status 03/26/2017 FINAL  Final  Culture, blood (Routine x 2)     Status: None   Collection Time: 03/21/17  1:41 PM  Result Value Ref Range Status   Specimen Description BLOOD  LEFT HAND  Final   Special Requests   Final    BOTTLES DRAWN AEROBIC AND ANAEROBIC Blood Culture results may not be optimal due to an inadequate volume of blood received in culture bottles   Culture   Final    NO GROWTH 5 DAYS Performed at [facility],  [address], [place], [place] [address]    Report  Status 03/26/2017 FINAL  Final  MRSA PCR Screening     Status: None   Collection Time: 03/21/17  6:21 PM  Result Value Ref Range Status   MRSA by PCR NEGATIVE NEGATIVE Final    Comment:        The GeneXpert MRSA Assay (FDA approved for NASAL specimens only), is one component of a comprehensive MRSA colonization surveillance program. It is not intended to diagnose MRSA infection nor to guide or monitor treatment for MRSA infections. Performed at [facility], [address] [place], [place] [address]   MRSA PCR Screening     Status: None   Collection Time: 03/26/17  6:34 AM  Result Value Ref Range Status   MRSA by PCR NEGATIVE NEGATIVE Final    Comment:        The GeneXpert MRSA Assay (FDA approved for NASAL specimens only), is one component of a comprehensive MRSA colonization surveillance program. It is not intended to diagnose MRSA infection nor to guide or monitor treatment for MRSA infections. Performed at [facility], [address], [place], [place] [address]     Radiology Reports Dg Chest 1 View  Result Date: 03/23/2017 CLINICAL DATA:  Shortness of breath for the past 3 days.  Smoker. EXAM: CHEST  1 VIEW COMPARISON:  03/21/2017. FINDINGS: Interval borderline enlargement of the cardiac silhouette and extensive right lung airspace opacity. The interstitial markings on the left are prominent with faint airspace opacities with mild progression. No pleural fluid seen. Thoracolumbar spine degenerative changes. IMPRESSION: 1. Significant worsening of airspace opacity throughout the right lung, suspicious for pneumonia. 2. Mild increase in prominence of the interstitial markings and faint airspace opacities on the left, also suspicious for pneumonia. 3. Interval borderline cardiomegaly. This is accentuated by the portable AP technique and poor inspiration. Electronically Signed   By: Beckie Salts M.D.   On: 03/23/2017 14:10   Dg Chest 2 View  Result  Date: 03/28/2017 CLINICAL DATA:  SOB EXAM: CHEST - 2 VIEW COMPARISON:  03/27/2017 FINDINGS: Bilateral diffuse interstitial and alveolar airspace opacities. No pleural effusion or pneumothorax. Normal cardiomediastinal silhouette. No acute osseous abnormality. IMPRESSION: 1. Bilateral diffuse interstitial and alveolar airspace opacities which may reflect pulmonary edema versus multilobar pneumonia. Electronically Signed   By: Elige Ko   On: 03/28/2017 10:34   Dg Chest 2 View  Result Date: 03/25/2017 CLINICAL DATA:  Shortness of Breath EXAM: CHEST - 2 VIEW COMPARISON:  03/23/2017 FINDINGS: Small bilateral pleural effusions. Patchy bilateral airspace opacities are similar to prior study, compatible with pneumonia. Heart is normal size. IMPRESSION: Bilateral airspace opacities, right greater than left are similar prior study, compatible with multifocal pneumonia. Small bilateral effusions. Electronically Signed   By: Charlett Nose M.D.   On: 03/25/2017 20:56   Dg Chest 2 View  Result Date: 03/21/2017 CLINICAL DATA:  Not feeling well.  Shortness of breath. EXAM: CHEST - 2 VIEW COMPARISON:  03/17/2017. FINDINGS: BILATERAL airspace opacities have worsened RIGHT greater than LEFT since the prior radiograph consistent with pneumonia. No effusion or pneumothorax. No significant lobar atelectasis. Normal heart size. No bony abnormality. Suspected COPD. IMPRESSION: Worsening aeration suggesting BILATERAL pneumonia, worse on the RIGHT. Electronically Signed   By: Elsie Stain M.D.   On: 03/21/2017 14:06   Dg Chest 2 View  Result Date: 03/04/2017 CLINICAL DATA:  Cough EXAM: CHEST  2 VIEW COMPARISON:  11/10/2016 FINDINGS: Normal heart size and  mediastinal contours. Borderline hyperinflation. No acute infiltrate or edema. No effusion or pneumothorax. No acute osseous findings. IMPRESSION: Negative chest. Electronically Signed   By: Marnee Spring M.D.   On: 03/04/2017 15:20   Ct Head Wo Contrast  Result Date:  03/17/2017 CLINICAL DATA:  Chest pain starting at 1830 hours with dyspnea. Altered level of consciousness. Patient could not recognize family members. EXAM: CT HEAD WITHOUT CONTRAST TECHNIQUE: Contiguous axial images were obtained from the base of the skull through the vertex without intravenous contrast. COMPARISON:  03/04/2017 FINDINGS: Brain: Stable superficial and central atrophy with chronic appearing small vessel ischemic disease. No acute intracranial hemorrhage midline shift or edema. No intra-axial mass nor extra-axial fluid collections. Midline fourth ventricle and basal cisterns. Vascular: No hyperdense vessel sign. Skull: No acute skull fracture or suspicious osseous lesions. Sinuses/Orbits: Stable findings of polypoid mucosal thickening involving the medial right maxillary sinus. Partial opacification of left mastoid air cells also chronic. Other: None IMPRESSION: 1. Stable involutional changes of the brain with small vessel ischemic disease. No acute intracranial abnormality. 2. Stable polypoid opacification medial aspect right maxillary sinus potentially represent small polyp or mucous retention cyst. Chronic left mastoid effusion. Electronically Signed   By: Tollie Eth M.D.   On: 03/17/2017 21:38   Ct Head Wo Contrast  Result Date: 03/04/2017 CLINICAL DATA:  62 year old male with daily alcohol intake. Seizures, on seizure medication. Somnolence and confusion. Initial encounter. EXAM: CT HEAD WITHOUT CONTRAST TECHNIQUE: Contiguous axial images were obtained from the base of the skull through the vertex without intravenous contrast. COMPARISON:  11/11/2013 head CT. FINDINGS: Brain: No intracranial hemorrhage or CT evidence of large acute infarct. Moderate global atrophy. No intracranial mass lesion noted on this unenhanced exam. Vascular: Minimal vascular calcifications Skull: No acute abnormality. Sinuses/Orbits: No acute orbital abnormality. Mild polypoid opacification medial aspect right  maxillary sinus. Remainder of visualized paranasal sinuses are clear. Other: Partial opacification left mastoid air cells without obstructing lesion of the eustachian tube noted. IMPRESSION: No acute intracranial abnormality noted. Moderate global atrophy. Polypoid opacification medial aspect right maxillary sinus and partial opacification left mastoid air cells. Electronically Signed   By: Lacy Duverney M.D.   On: 03/04/2017 17:57   Ct Chest W Contrast  Result Date: 03/04/2017 CLINICAL DATA:  Unintended weight loss. Nonlocalized abdominal pain. Recent nausea and vomiting. History of daily ETOH use and seizures. EXAM: CT CHEST, ABDOMEN, AND PELVIS WITH CONTRAST TECHNIQUE: Multidetector CT imaging of the chest, abdomen and pelvis was performed following the standard protocol during bolus administration of intravenous contrast. CONTRAST:  ISOVUE-300 IOPAMIDOL (ISOVUE-300) INJECTION 61% COMPARISON:  CT AP 08/06/2009 FINDINGS: CT CHEST FINDINGS Cardiovascular: Normal branch pattern of the great vessels. No aortic aneurysm or dissection. No large central pulmonary embolus. Normal sized heart without pericardial effusion. Minimal coronary arteriosclerosis along the circumflex. Mediastinum/Nodes: No enlarged mediastinal, hilar, or axillary lymph nodes. Thyroid gland, trachea, and esophagus demonstrate no significant findings. Lungs/Pleura: Minimal scarring and/or atelectasis at the apices. Faint nonspecific ground-glass opacities in both upper lobes and lingula with subpleural areas of atelectasis and/or scarring in the right middle lobe. No effusion or pneumothorax. Musculoskeletal: Bilateral gynecomastia. CT ABDOMEN PELVIS FINDINGS Hepatobiliary: Subtle surface nodularity of the liver that may reflect morphologic changes of cirrhosis. A pair of adjacent subcentimeter hypodensities in the right hepatic lobe are noted compatible with cysts or hemangiomata. A third similar subcentimeter hypodensity is seen further  caudad anteriorly within the right hepatic lobe measuring up to 8 mm. No biliary dilatation.  Normal gallbladder. Pancreas: No ductal dilatation, inflammation or mass. Spleen: Normal Adrenals/Urinary Tract: Normal bilateral adrenal glands. Symmetric enhancement of both kidneys without obstructive uropathy. Normal bladder. Stomach/Bowel: Small hiatal hernia. Physiologic distention of the stomach. Mild small bowel distention with fluid with slight fold thickening in the central abdomen compatible with an enteritis. No mechanical source of obstruction is seen. Decompressed descending colon through rectum. Liquid stool noted within the cecum and ascending colon. No evidence of appendicitis. Vascular/Lymphatic: Aortoiliac and branch vessel atherosclerosis without aneurysm or dissection. Reproductive: Prostate is unremarkable. Other: No abdominal wall hernia or abnormality. No abdominopelvic ascites. Musculoskeletal: Degenerative disc disease L5-S1. No acute nor suspicious osseous abnormality. IMPRESSION: 1. Nonspecific faint ground-glass opacities are seen in both upper lobes left greater than right as well as lingula. Findings may represent small foci of alveolitis or pneumonitis are nonspecific. 2. Minimal coronary arteriosclerosis. 3. Mild fluid distention of small bowel without mechanical obstruction suspicious for mild small bowel enteritis. 4. Morphologic appearance of cirrhosis. 5. Three subcentimeter hypodensities in the right hepatic lobe are noted too small to further characterize but statistically consistent with cysts or hemangiomata. 6. Degenerative disc disease L5-S1. Electronically Signed   By: Tollie Eth M.D.   On: 03/04/2017 18:02   Ct Angio Chest Pe W/cm &/or Wo Cm  Result Date: 03/26/2017 CLINICAL DATA:  62 year old male with recent discharge from the hospital for pneumonia and sepsis. Worsening of shortness of breath and confusion. EXAM: CT ANGIOGRAPHY CHEST CT ABDOMEN AND PELVIS WITH CONTRAST  TECHNIQUE: Multidetector CT imaging of the chest was performed using the standard protocol during bolus administration of intravenous contrast. Multiplanar CT image reconstructions and MIPs were obtained to evaluate the vascular anatomy. Multidetector CT imaging of the abdomen and pelvis was performed using the standard protocol during bolus administration of intravenous contrast. CONTRAST:  ISOVUE-370 IOPAMIDOL (ISOVUE-370) INJECTION 76% COMPARISON:  Chest CT dated 03/04/2017 FINDINGS: CTA CHEST FINDINGS Cardiovascular: There is no cardiomegaly or pericardial effusion. The thoracic aorta is unremarkable. The origins of the great vessels of the aortic arch appear patent. There is no CT evidence of pulmonary embolism. Mediastinum/Nodes: Bilateral hilar and mediastinal adenopathy, reactive. Right hilar lymph node measures 13 mm in short axis. The esophagus is grossly unremarkable. No mediastinal fluid collection. Lungs/Pleura: There are extensive bilateral patchy ground-glass opacity and interlobular septal thickening with upper lobe predominant and new compared to the prior CT. Differentials include ARDS, bacterial pneumonia, acute interstitial pneumonia, or pulmonary alveolar proteinosis. Other etiologies are not excluded. Clinical correlation and pulmonary consult is advised. There is moderate bilateral pleural effusions, new since the prior CT with associated partial compressive atelectasis of the lower lobes. No pneumothorax. The central airways are patent. Musculoskeletal: There is diffuse subcutaneous edema and anasarca. The osseous structures are intact. Review of the MIP images confirms the above findings. CT ABDOMEN and PELVIS FINDINGS No intra-abdominal free air.  Small ascites. Hepatobiliary: Indeterminate 15 mm hypodense lesion in the right lobe of the liver similar to prior CT. No intrahepatic biliary ductal dilatation. The gallbladder is unremarkable. Pancreas: Unremarkable. No pancreatic ductal  dilatation or surrounding inflammatory changes. Spleen: Normal in size without focal abnormality. Adrenals/Urinary Tract: The adrenal glands, kidneys, and the visualized ureters appear unremarkable. The urinary bladder is decompressed around a Foley catheter. Air within the bladder introduced via catheter. Stomach/Bowel: There is no bowel obstruction or active inflammation. Normal appendix. Vascular/Lymphatic: Mild aortoiliac atherosclerotic disease. No portal venous gas. There is no adenopathy. Reproductive: The prostate and seminal vesicles are grossly  unremarkable. Other: Diffuse subcutaneous edema and anasarca. Musculoskeletal: Degenerative changes of the spine. No acute osseous pathology. Review of the MIP images confirms the above findings. IMPRESSION: 1. Diffuse bilateral airspace density and interlobular septal prominence with a "crazy paving" appearance and new since the prior CT. Differentials include ARDS, bacterial pneumonia, acute interstitial pneumonitis, or pulmonary alveolar proteinosis. Clinical correlation and pulmonary consult is advised. 2. Interval development of moderate size bilateral pleural effusions with associated partial compressive atelectasis of the lower lobes. 3. No CT evidence of pulmonary artery embolus. 4. Small ascites and anasarca. Electronically Signed   By: Elgie Collard M.D.   On: 03/26/2017 05:04   Ct Abdomen Pelvis W Contrast  Result Date: 03/26/2017 CLINICAL DATA:  62 year old male with recent discharge from the hospital for pneumonia and sepsis. Worsening of shortness of breath and confusion. EXAM: CT ANGIOGRAPHY CHEST CT ABDOMEN AND PELVIS WITH CONTRAST TECHNIQUE: Multidetector CT imaging of the chest was performed using the standard protocol during bolus administration of intravenous contrast. Multiplanar CT image reconstructions and MIPs were obtained to evaluate the vascular anatomy. Multidetector CT imaging of the abdomen and pelvis was performed using the  standard protocol during bolus administration of intravenous contrast. CONTRAST:  ISOVUE-370 IOPAMIDOL (ISOVUE-370) INJECTION 76% COMPARISON:  Chest CT dated 03/04/2017 FINDINGS: CTA CHEST FINDINGS Cardiovascular: There is no cardiomegaly or pericardial effusion. The thoracic aorta is unremarkable. The origins of the great vessels of the aortic arch appear patent. There is no CT evidence of pulmonary embolism. Mediastinum/Nodes: Bilateral hilar and mediastinal adenopathy, reactive. Right hilar lymph node measures 13 mm in short axis. The esophagus is grossly unremarkable. No mediastinal fluid collection. Lungs/Pleura: There are extensive bilateral patchy ground-glass opacity and interlobular septal thickening with upper lobe predominant and new compared to the prior CT. Differentials include ARDS, bacterial pneumonia, acute interstitial pneumonia, or pulmonary alveolar proteinosis. Other etiologies are not excluded. Clinical correlation and pulmonary consult is advised. There is moderate bilateral pleural effusions, new since the prior CT with associated partial compressive atelectasis of the lower lobes. No pneumothorax. The central airways are patent. Musculoskeletal: There is diffuse subcutaneous edema and anasarca. The osseous structures are intact. Review of the MIP images confirms the above findings. CT ABDOMEN and PELVIS FINDINGS No intra-abdominal free air.  Small ascites. Hepatobiliary: Indeterminate 15 mm hypodense lesion in the right lobe of the liver similar to prior CT. No intrahepatic biliary ductal dilatation. The gallbladder is unremarkable. Pancreas: Unremarkable. No pancreatic ductal dilatation or surrounding inflammatory changes. Spleen: Normal in size without focal abnormality. Adrenals/Urinary Tract: The adrenal glands, kidneys, and the visualized ureters appear unremarkable. The urinary bladder is decompressed around a Foley catheter. Air within the bladder introduced via catheter.  Stomach/Bowel: There is no bowel obstruction or active inflammation. Normal appendix. Vascular/Lymphatic: Mild aortoiliac atherosclerotic disease. No portal venous gas. There is no adenopathy. Reproductive: The prostate and seminal vesicles are grossly unremarkable. Other: Diffuse subcutaneous edema and anasarca. Musculoskeletal: Degenerative changes of the spine. No acute osseous pathology. Review of the MIP images confirms the above findings. IMPRESSION: 1. Diffuse bilateral airspace density and interlobular septal prominence with a "crazy paving" appearance and new since the prior CT. Differentials include ARDS, bacterial pneumonia, acute interstitial pneumonitis, or pulmonary alveolar proteinosis. Clinical correlation and pulmonary consult is advised. 2. Interval development of moderate size bilateral pleural effusions with associated partial compressive atelectasis of the lower lobes. 3. No CT evidence of pulmonary artery embolus. 4. Small ascites and anasarca. Electronically Signed   By: Burtis Junes  Radparvar M.D.   On: 03/26/2017 05:04   Ct Abdomen Pelvis W Contrast  Result Date: 03/04/2017 CLINICAL DATA:  Unintended weight loss. Nonlocalized abdominal pain. Recent nausea and vomiting. History of daily ETOH use and seizures. EXAM: CT CHEST, ABDOMEN, AND PELVIS WITH CONTRAST TECHNIQUE: Multidetector CT imaging of the chest, abdomen and pelvis was performed following the standard protocol during bolus administration of intravenous contrast. CONTRAST:  ISOVUE-300 IOPAMIDOL (ISOVUE-300) INJECTION 61% COMPARISON:  CT AP 08/06/2009 FINDINGS: CT CHEST FINDINGS Cardiovascular: Normal branch pattern of the great vessels. No aortic aneurysm or dissection. No large central pulmonary embolus. Normal sized heart without pericardial effusion. Minimal coronary arteriosclerosis along the circumflex. Mediastinum/Nodes: No enlarged mediastinal, hilar, or axillary lymph nodes. Thyroid gland, trachea, and esophagus  demonstrate no significant findings. Lungs/Pleura: Minimal scarring and/or atelectasis at the apices. Faint nonspecific ground-glass opacities in both upper lobes and lingula with subpleural areas of atelectasis and/or scarring in the right middle lobe. No effusion or pneumothorax. Musculoskeletal: Bilateral gynecomastia. CT ABDOMEN PELVIS FINDINGS Hepatobiliary: Subtle surface nodularity of the liver that may reflect morphologic changes of cirrhosis. A pair of adjacent subcentimeter hypodensities in the right hepatic lobe are noted compatible with cysts or hemangiomata. A third similar subcentimeter hypodensity is seen further caudad anteriorly within the right hepatic lobe measuring up to 8 mm. No biliary dilatation. Normal gallbladder. Pancreas: No ductal dilatation, inflammation or mass. Spleen: Normal Adrenals/Urinary Tract: Normal bilateral adrenal glands. Symmetric enhancement of both kidneys without obstructive uropathy. Normal bladder. Stomach/Bowel: Small hiatal hernia. Physiologic distention of the stomach. Mild small bowel distention with fluid with slight fold thickening in the central abdomen compatible with an enteritis. No mechanical source of obstruction is seen. Decompressed descending colon through rectum. Liquid stool noted within the cecum and ascending colon. No evidence of appendicitis. Vascular/Lymphatic: Aortoiliac and branch vessel atherosclerosis without aneurysm or dissection. Reproductive: Prostate is unremarkable. Other: No abdominal wall hernia or abnormality. No abdominopelvic ascites. Musculoskeletal: Degenerative disc disease L5-S1. No acute nor suspicious osseous abnormality. IMPRESSION: 1. Nonspecific faint ground-glass opacities are seen in both upper lobes left greater than right as well as lingula. Findings may represent small foci of alveolitis or pneumonitis are nonspecific. 2. Minimal coronary arteriosclerosis. 3. Mild fluid distention of small bowel without mechanical  obstruction suspicious for mild small bowel enteritis. 4. Morphologic appearance of cirrhosis. 5. Three subcentimeter hypodensities in the right hepatic lobe are noted too small to further characterize but statistically consistent with cysts or hemangiomata. 6. Degenerative disc disease L5-S1. Electronically Signed   By: Tollie Eth M.D.   On: 03/04/2017 18:02   Dg Chest Port 1 View  Result Date: 03/27/2017 CLINICAL DATA:  Clinically suspected pneumonia. EXAM: PORTABLE CHEST 1 VIEW COMPARISON:  Portable chest x-ray of March 26, 2017 FINDINGS: The lungs are well-expanded. The interstitial markings remain increased diffusely. Patchy airspace opacity is noted especially on the right. The heart is normal in size. The pulmonary vascularity is indistinct. The mediastinum is normal in width. There is no pleural effusion. The bony thorax is unremarkable. IMPRESSION: Persistent confluent interstitial and some airspace opacity bilaterally. Findings are consistent with pneumonia, edema, or ARDS in the appropriate clinical setting. Electronically Signed   By: David  [place] M.D.   On: 03/27/2017 14:00   Dg Chest Port 1 View  Result Date: 03/26/2017 CLINICAL DATA:  Pneumonia and sepsis EXAM: PORTABLE CHEST 1 VIEW COMPARISON:  03/25/2017, 03/23/2017, 03/17/2017, CT chest 03/04/2017, 03/26/2017 FINDINGS: Fairly extensive diffuse bilateral interstitial and ground-glass opacity. Small pleural effusions.  Stable cardiomediastinal silhouette. No pneumothorax. IMPRESSION: Extensive interstitial and ground-glass opacity which may reflect edema, diffuse pneumonia, or ARDS. Moderate pleural effusions on CT are not well demonstrated on portable view of the chest. Electronically Signed   By: Jasmine PangKim  Fujinaga M.D.   On: 03/26/2017 18:30   Dg Chest Portable 1 View  Result Date: 03/17/2017 CLINICAL DATA:  Chest pain starting at 1830 hours. EXAM: PORTABLE CHEST 1 VIEW COMPARISON:  03/04/2017 CXR FINDINGS: Portable AP semi upright view.  Heart size is mildly enlarged but this may be due to the portable technique and slightly low lung volumes. No overt pulmonary edema, pneumonic consolidation or effusion. There is bibasilar as well as right upper lobe atelectasis. No acute nor suspicious osseous abnormalities. IMPRESSION: Bibasilar and right upper lobe atelectasis. Borderline cardiomegaly. Slightly low lung volumes. Electronically Signed   By: Tollie Ethavid  Kwon M.D.   On: 03/17/2017 21:33     CBC Recent Labs  Lab 03/21/17 1338 03/22/17 0408 03/25/17 2024 03/27/17 0629 03/28/17 0553  WBC 15.4* 9.2 11.6* 8.2 10.9*  HGB 15.1 12.4* 10.7* 9.6* 9.9*  HCT 45.1 35.9* 32.4* 28.4* 30.0*  PLT 259 187 281 246 282  MCV 98.9 97.8 99.6 99.0 101.0*  MCH 33.2 33.7 33.0 33.4 33.2  MCHC 33.6 34.5 33.1 33.8 32.9  RDW 14.9* 14.4 14.6* 15.2* 15.3*  LYMPHSABS 0.7*  --   --  0.2* 0.2*  MONOABS 0.7  --   --  0.4 0.4  EOSABS 0.0  --   --  0.0 0.0  BASOSABS 0.0  --   --  0.0 0.0    Chemistries  Recent Labs  Lab 03/21/17 1338  03/22/17 1521 03/23/17 0733 03/24/17 0612 03/25/17 2024 03/26/17 0520 03/27/17 0629 03/28/17 0553  NA 128*   < >  --  136 136 137  --  140 143  K 3.3*   < >  --  3.9 4.3 4.2  --  3.4* 4.2  CL 99*   < >  --  113* 112* 107  --  108 111  CO2 15*   < >  --  18* 18* 21*  --  22 23  GLUCOSE 117*   < >  --  83 136* 97  --  133* 123*  BUN 10   < >  --  12 15 16   --  22* 32*  CREATININE 1.08   < >  --  0.97 0.91 0.83  --  0.88 1.02  CALCIUM 8.6*   < >  --  8.1* 8.4* 8.8*  --  8.6* 9.0  MG  --   --  1.8 1.9 2.0  --   --  2.2 2.5*  AST 40  --   --   --   --   --  108*  --   --   ALT 14*  --   --   --   --   --  36  --   --   ALKPHOS 114  --   --   --   --   --  212*  --   --   BILITOT 1.8*  --   --   --   --   --  1.4*  --   --    < > = values in this interval not displayed.   ------------------------------------------------------------------------------------------------------------------ estimated creatinine clearance  is 68.6 mL/min (by C-G formula based on SCr of 1.02 mg/dL). ------------------------------------------------------------------------------------------------------------------ No results for input(s): HGBA1C in the  last 72 hours. ------------------------------------------------------------------------------------------------------------------ No results for input(s): CHOL, HDL, LDLCALC, TRIG, CHOLHDL, LDLDIRECT in the last 72 hours. ------------------------------------------------------------------------------------------------------------------ Recent Labs    03/26/17 0631  TSH 2.056   ------------------------------------------------------------------------------------------------------------------ No results for input(s): VITAMINB12, FOLATE, FERRITIN, TIBC, IRON, RETICCTPCT in the last 72 hours.  Coagulation profile Recent Labs  Lab 03/21/17 1338 03/26/17 0631  INR 1.31 1.18    No results for input(s): DDIMER in the last 72 hours.  Cardiac Enzymes Recent Labs  Lab 03/21/17 1338 03/25/17 2024 03/25/17 2221  TROPONINI <0.03 0.03* <0.03   ------------------------------------------------------------------------------------------------------------------ Invalid input(s): POCBNP    Assessment & Plan   This is a 62 year old male admitted for pneumonia.  1.    Acute hypoxic respiratory failure due to B/L pneumonia possible ARDS Speech to see regarding swallow eval 2. Acute respiratory failure  With ARDS from persistent bilateral HCAP /Aspiration pneumonia-  Continue IV antibiotics continue steroids 3. Sepsis: present on admission, improving on Abx 4. Uncontrolled hypertension continue metoprolol labetalol as needed 5.  Hx of seizure disorder continue Keppra 6. Hx Nicotine dependency        Code Status Orders  (From admission, onward)        Start     Ordered   03/26/17 0630  Full code  Continuous     03/26/17 0629    Code Status History    Date Active Date  Inactive Code Status Order ID Comments User Context   03/21/2017 1813 03/24/2017 2058 Full Code [id number]  Adrian Saran, MD Inpatient   03/13/2017 1004 03/14/2017 1346 Full Code [id number]  Enid Baas, MD Inpatient   03/12/2017 1606 03/13/2017 1004 DNR [id number]  Ihor Austin, MD Inpatient   03/04/2017 1923 03/04/2017 2355 DNR [id number]  Alford Highland, MD ED           Consults pulmonary critical care  DVT Prophylaxis  Lovenox   Lab Results  Component Value Date   PLT 282 03/28/2017     Time Spent in minutes 34 minutes   Greater than 50% of time spent in care coordination and counseling patient regarding the condition and plan of care.   Auburn Bilberry M.D on 03/28/2017 at 1:01 PM  Between 7am to 6pm - Pager - [phone]  After 6pm go to [email or web address] - Social research officer, government  [employer]   Office  [phone]

## 2017-03-28 NOTE — Evaluation (Signed)
Clinical/Bedside Swallow Evaluation Patient Details  Name: Marco [name] MRN: [id number] Date of Birth: 1955/12/06  Today's Date: 03/28/2017 Time: SLP Start Time (ACUTE ONLY): 1140 SLP Stop Time (ACUTE ONLY): 1240 SLP Time Calculation (min) (ACUTE ONLY): 60 min  Past Medical History:  Past Medical History:  Diagnosis Date  . Anxiety   . Arthritis   . Depression   . Heartburn   . Seizures (HCC)    Past Surgical History:  Past Surgical History:  Procedure Laterality Date  . KNEE ARTHROSCOPY  1975   HPI:  Pt is a 62 y/o male with past medical history of seizure disorder, heartburn, arthritis, depression/anxiety, ETOH use at home per Wife presents to the emergency department with shortness of breath.  The patient was discharged from the hospital yesterday following treatment for pneumonia.  He was also discharged with an indwelling Foley catheter.  He was unable to purchase his antibiotics following discharge and thus missed 1 dose of his pneumonia treatment.  Upon return to the emergency department the patient rapidly became more agitated and confused.  His oxygen saturations gradually dropped to below 88% requiring 3 L of oxygen via nasal cannula.  He received cefepime and vancomycin in the emergency department but appeared to worsen clinically.  The patient's blood pressure also dramatically elevated to more than 200/100 and heart rate greater than 130.  His respiratory rate also increased to greater than 30.  Nitropaste was applied to his chest and he was given Benadryl due to concern for possible allergic reaction.  The patient's agitation improved and his blood pressure decreased but he remained diaphoretic and short of breath despite good oxygen saturations on supplemental O2.  A CT of his chest and abdomen was obtained to rule out pulmonary embolism or acute intracranial process.  It showed an intralobular thickening and inflammatory pattern along with new pleural effusions bilaterally,  right greater than left. Pt's PMH includes Heartburn which Wife reported that he eats "a lot of TUMS" at home. Pt endorsed significant s/s of REFLUX and regurgitation of REFLUX material to the pharynx awaking w/ a Sour, Burning feeling in his throat in the mornings at times. Noted pt has had some decline in respiratory status in the past year per chart notes.    Assessment / Plan / Recommendation Clinical Impression  Pt appears to present w/ adequate oropharyngeal phase swallow function but suspect possible delayed pharyngeal swallow initiation and reduced control w/ multiple sips of thin liquids; pt is at reduced risk for aspiration when following general aspiration precautions. Pt consumed po trials of ice chips, thin liquids via cup, and purees/soft solids w/ no immediate, overt s/s of aspiration noted until he took multiple sips of thin liquids via cup. Pt had been instructed to take SINGLE, small sips but took MULTIPLE sips during 1 trial presentation which was followed by immediate coughing. Pt and Wife reported the frequent occurence of "heartburn" w/ waking to a sour, burning feeling in his throat in the mornings. Pt also eat TUMS frequently at home per report. Suspect pt's REFLUX activity could have impact on the pharynx and may reduce pharyngeal sensation for the pharyngeal swallow initiation thus impacting timing of the swallow initiation. This can increase risk for aspiration/penetration which can in turn impact Pulmonary status. With education and instruction, pt consumed trials of thin liquids via Cup using single, small sips then trials of puree/soft solids w/ no overt s/s of aspiration noted; clear vocal quality b/t trials. Oral phase appeared WF for bolus  management and clearing though min increased time needed for mastication of increased texture. It was then he exhibited min increased respiratory effort w/ the exertion of the trials. Pt was educated/instructed on taking REST BREAKS to avoid  increased WOB/SOB w/ po trials. Pt fed self w/ setup. Recommend a Dysphagia level 3(mech soft) w/ thin liquids diet - NO STRAWS and use SINGLE, SMALL sips. Recommend PIlls in Puree Whole; Rest Breaks during meals. NSG/ST will monitor pt's toleration of diet while admitted. Strongly recommend f/u w/ GI for assessment and management of REFLUX to lessen its impact overall.  SLP Visit Diagnosis: Dysphagia, oropharyngeal phase (R13.12)(Esophageal s/s of REFLUX)    Aspiration Risk  Mild aspiration risk(but reduced when following general precautions)    Diet Recommendation  Dysphagia level 3(mech soft, moistened foods easy to chew to lessen exertion); Thin liquids - NO STRAWS. Aspiration precautions; Rest Breaks during meals to lessen WOB.  Medication Administration: Whole meds with puree(for safer swallowing)    Other  Recommendations Recommended Consults: Consider GI evaluation(Dietician f/u) Oral Care Recommendations: Oral care BID;Patient independent with oral care   Follow up Recommendations None      Frequency and Duration min 2x/week  1 week       Prognosis Prognosis for Safe Diet Advancement: Fair(-Good) Barriers to Reach Goals: Behavior      Swallow Study   General Date of Onset: 03/25/17 HPI: Pt is a 62 y/o male with past medical history of seizure disorder, heartburn, arthritis, depression/anxiety, ETOH use at home per Wife presents to the emergency department with shortness of breath.  The patient was discharged from the hospital yesterday following treatment for pneumonia.  He was also discharged with an indwelling Foley catheter.  He was unable to purchase his antibiotics following discharge and thus missed 1 dose of his pneumonia treatment.  Upon return to the emergency department the patient rapidly became more agitated and confused.  His oxygen saturations gradually dropped to below 88% requiring 3 L of oxygen via nasal cannula.  He received cefepime and vancomycin in the  emergency department but appeared to worsen clinically.  The patient's blood pressure also dramatically elevated to more than 200/100 and heart rate greater than 130.  His respiratory rate also increased to greater than 30.  Nitropaste was applied to his chest and he was given Benadryl due to concern for possible allergic reaction.  The patient's agitation improved and his blood pressure decreased but he remained diaphoretic and short of breath despite good oxygen saturations on supplemental O2.  A CT of his chest and abdomen was obtained to rule out pulmonary embolism or acute intracranial process.  It showed an intralobular thickening and inflammatory pattern along with new pleural effusions bilaterally, right greater than left. Pt's PMH includes Heartburn which Wife reported that he eats "a lot of TUMS" at home. Pt endorsed significant s/s of REFLUX and regurgitation of REFLUX material to the pharynx awaking w/ a Sour, Burning feeling in his throat in the mornings at times. Noted pt has had some decline in respiratory status in the past year per chart notes.  Type of Study: Bedside Swallow Evaluation Previous Swallow Assessment: none Diet Prior to this Study: NPO(regular at home) Temperature Spikes Noted: No(wbc 10.9) Respiratory Status: Nasal cannula(2-3 liters) History of Recent Intubation: No Behavior/Cognition: Alert;Cooperative;Pleasant mood;Distractible;Requires cueing(min) Oral Cavity Assessment: Within Functional Limits;Dry Oral Care Completed by SLP: Recent completion by staff Oral Cavity - Dentition: Adequate natural dentition;Missing dentition(few) Vision: Functional for self-feeding Self-Feeding Abilities: Able to  feed self;Needs assist;Needs set up Patient Positioning: Upright in chair Baseline Vocal Quality: Normal;Low vocal intensity(min) Volitional Cough: Strong Volitional Swallow: Able to elicit    Oral/Motor/Sensory Function Overall Oral Motor/Sensory Function: Within  functional limits   Ice Chips Ice chips: Within functional limits Presentation: Spoon(fed; 5 trials)   Thin Liquid Thin Liquid: Impaired Presentation: Cup;Self Fed(~4 ozs) Oral Phase Impairments: (none) Oral Phase Functional Implications: (none) Pharyngeal  Phase Impairments: Cough - Immediate(x1 w/ multiple swallows ) Other Comments: pt had been instructed to take SINGLE, small sips but took MULTIPLE sips instead    Nectar Thick Nectar Thick Liquid: Not tested   Honey Thick Honey Thick Liquid: Not tested   Puree Puree: Within functional limits Presentation: Spoon;Self Fed(8 trials)   Solid   GO   Solid: Impaired Presentation: (fed; 4 trials) Oral Phase Impairments: (none) Oral Phase Functional Implications: (min increased mastication time) Pharyngeal Phase Impairments: (min increased respiratory effort w/ exertion of trials)         Jerilynn Som, MS, CCC-SLP Labrina Lines 03/28/2017,4:06 PM

## 2017-03-28 NOTE — Progress Notes (Signed)
MD notified of pt labored breathing and wheezing. O2 sats WDL. BP elevated, HR improving. Orders to give extra dose of lasix 40mg  IV x 1 now. Will give and continue to monitor. If no improvement, per MD give xopenex prn dose.

## 2017-03-28 NOTE — Progress Notes (Signed)
Physical Therapy Treatment Patient Details Name: Prentis Langdon MRN: [id number] DOB: 1955/07/16 Today's Date: 03/28/2017    History of Present Illness 62 yo male with onset of HCAP diagnosed recently and septic, mult admissions to ED recently, acute respiratory failure.  Has hypoxia, ascites, anasarca, HTN, may get a thoracentesis in a day or so.  Has indwelling foley, not on O2 previously.  PMHx:  seizures, PNA, sepsis, OA    PT Comments    Pt bed resting.  Initially declines session but then requests to get up to chair.  Bed mobility and transfer with min assist.  Generally decreased safety and fatigued with activity.  Remained in recliner and encouraged to call nursing when he wanted to return to bed.   Follow Up Recommendations  SNF     Equipment Recommendations  Rolling walker with 5" wheels    Recommendations for Other Services       Precautions / Restrictions Precautions Precautions: Fall Precaution Comments: CIWA Restrictions Weight Bearing Restrictions: No    Mobility  Bed Mobility Overal bed mobility: Needs Assistance Bed Mobility: Supine to Sit     Supine to sit: Min assist        Transfers Overall transfer level: Needs assistance   Transfers: Sit to/from Stand Sit to Stand: Min guard;Min assist            Ambulation/Gait Ambulation/Gait assistance: Min assist Ambulation Distance (Feet): 3 Feet Assistive device: Rolling walker (2 wheeled) Gait Pattern/deviations: Step-to pattern   Gait velocity interpretation: Below normal speed for age/gender General Gait Details: unable to walk further than chair   Stairs            Wheelchair Mobility    Modified Rankin (Stroke Patients Only)       Balance Overall balance assessment: Needs assistance Sitting-balance support: Feet supported Sitting balance-Leahy Scale: Fair     Standing balance support: Bilateral upper extremity supported Standing balance-Leahy Scale: Poor                               Cognition Arousal/Alertness: Awake/alert Behavior During Therapy: Impulsive Overall Cognitive Status: Within Functional Limits for tasks assessed                                        Exercises      General Comments        Pertinent Vitals/Pain Pain Assessment: No/denies pain    Home Living                      Prior Function            PT Goals (current goals can now be found in the care plan section) Progress towards PT goals: Not progressing toward goals - comment    Frequency    Min 2X/week      PT Plan Current plan remains appropriate    Co-evaluation              AM-PAC PT "6 Clicks" Daily Activity  Outcome Measure  Difficulty turning over in bed (including adjusting bedclothes, sheets and blankets)?: A Little Difficulty moving from lying on back to sitting on the side of the bed? : Unable Difficulty sitting down on and standing up from a chair with arms (e.g., wheelchair, bedside commode, etc,.)?: Unable Help needed moving to and from  a bed to chair (including a wheelchair)?: A Lot Help needed walking in hospital room?: A Lot Help needed climbing 3-5 steps with a railing? : Total 6 Click Score: 10    End of Session Equipment Utilized During Treatment: Gait belt;Oxygen Activity Tolerance: Patient limited by fatigue Patient left: in chair;with call bell/phone within reach;with chair alarm set         Time: [id number] PT Time Calculation (min) (ACUTE ONLY): 12 min  Charges:  $Therapeutic Activity: 8-22 mins                    G Codes:       Danielle DessSarah Timmy Bubeck, PTA 03/28/17, 11:40 AM

## 2017-03-28 NOTE — Clinical Social Work Note (Signed)
Clinical Social Work Assessment  Patient Details  Name: Marco RegisterMichael Gulas MRN: [id number] Date of Birth: 12-04-1955  Date of referral:  03/28/17               Reason for consult:  Discharge Planning                Permission sought to share information with:  Family Supports Permission granted to share information::  Yes, Verbal Permission Granted  Name::        Agency::     Relationship::     Contact Information:     Housing/Transportation Living arrangements for the past 2 months:  Single Family Home Source of Information:  Patient Patient Interpreter Needed:  None Criminal Activity/Legal Involvement Pertinent to Current Situation/Hospitalization:  No - Comment as needed Significant Relationships:  Spouse Lives with:  Spouse Do you feel safe going back to the place where you live?  Yes Need for family participation in patient care:  Yes (Comment)  Care giving concerns:  Patient lives with his spouse.    Social Worker assessment / plan:  CSW visited patient this morning. Patient has been short of breath this morning and is on oxygen and has had breathing treatments. CSW informed patient that he could nod or shake his head for yes or no if he wanted to rather than talk. This way he could conserve energy. CSW explained role and purpose of visit. Patient nodded to working with PT and to knowing their recommendation for STR. When asked if wanted CSW to pursue this for him he clearly shook his head no. CSW asked if he had someone that could manage his care at home and he nodded yes. CSW noted that patient has a wife and patient nodded yes to if the caregiver would be his wife. CSW will continue to follow in the event patient changes his mind.  Employment status:  Disabled (Comment on whether or not currently receiving Disability) Insurance information:  Medicare PT Recommendations:  Skilled Nursing Facility Information / Referral to community resources:  Skilled Nursing  Facility  Patient/Family's Response to care:  Patient smiled and nodded at [facility].   Patient/Family's Understanding of and Emotional Response to Diagnosis, Current Treatment, and Prognosis:  Patient was trying to maintain his breathing.  Emotional Assessment Appearance:  Appears older than stated age Attitude/Demeanor/Rapport:  (cooperative) Affect (typically observed):  Calm Orientation:    Alcohol / Substance use:  Not Applicable Psych involvement (Current and /or in the community):  No (Comment)  Discharge Needs  Concerns to be addressed:  Care Coordination Readmission within the last 30 days:  No Current discharge risk:  None Barriers to Discharge:  No Barriers Identified   York SpanielMonica Tinamarie Przybylski, LCSW 03/28/2017, 11:37 AM

## 2017-03-29 LAB — BASIC METABOLIC PANEL
ANION GAP: 10 (ref 5–15)
BUN: 41 mg/dL — ABNORMAL HIGH (ref 6–20)
CALCIUM: 9.2 mg/dL (ref 8.9–10.3)
CHLORIDE: 105 mmol/L (ref 101–111)
CO2: 26 mmol/L (ref 22–32)
Creatinine, Ser: 1.13 mg/dL (ref 0.61–1.24)
GFR calc non Af Amer: >60 mL/min (ref >60)
Glucose, Bld: 135 mg/dL — ABNORMAL HIGH (ref 65–99)
Potassium: 4.2 mmol/L (ref 3.5–5.1)
SODIUM: 141 mmol/L (ref 135–145)

## 2017-03-29 LAB — LEVETIRACETAM LEVEL: LEVETIRACETAM: 25.2 ug/mL (ref 10.0–40.0)

## 2017-03-29 MED ORDER — PANTOPRAZOLE SODIUM 40 MG PO TBEC
40.0000 mg | DELAYED_RELEASE_TABLET | Freq: Every day | ORAL | Status: DC
  Administered 2017-03-30 – 2017-04-01 (×3): 40 mg via ORAL
  Filled 2017-03-29 (×3): qty 1

## 2017-03-29 MED ORDER — SODIUM CHLORIDE 0.9 % IV SOLN
3.0000 g | Freq: Four times a day (QID) | INTRAVENOUS | Status: DC
Start: 2017-03-29 — End: 2017-03-31
  Administered 2017-03-29 – 2017-03-31 (×8): 3 g via INTRAVENOUS
  Filled 2017-03-29 (×11): qty 3

## 2017-03-29 NOTE — Progress Notes (Signed)
Pharmacy Antibiotic Note  Marco RegisterMichael [name] is a 62 y.o. male admitted on 03/25/2017 with pneumonia.  Pharmacy has been consulted for cefepime dosing.  Plan: Day 5 IV cefepime. Continue cefepime 2 grams q 8 hours.  Progress notes mention possible aspiration PNA- would recommend considering changing Abx to Unasyn for anaerobic coverage.   Height: 5\' 6"  (167.6 cm) Weight: 155 lb 3.3 oz (70.4 kg) IBW/kg (Calculated) : 63.8  Temp (24hrs), Avg:98.1 F (36.7 C), Min:97 F (36.1 C), Max:99.2 F (37.3 C)  Recent Labs  Lab 03/24/17 0612 03/25/17 2024 03/27/17 0629 03/28/17 0553 03/29/17 0630  WBC  --  11.6* 8.2 10.9*  --   CREATININE 0.91 0.83 0.88 1.02 1.13    Estimated Creatinine Clearance: 61.9 mL/min (by C-G formula based on SCr of 1.13 mg/dL).    Allergies  Allergen Reactions  . Hydrocodone-Acetaminophen Nausea Only and Nausea And Vomiting    Antimicrobials this admission: 3/26 vancomycin>> 3/26 3/26 cefepime>>   Microbiology results: 3/21 BCx: NG final  3/21 MRSA PCR: (-)  3/25 CXR: bilateral opacities R>L  Thank you for allowing pharmacy to be a part of this patient's care.  Marco [name], PharmD, BCPS Clinical Pharmacist 03/29/2017 8:43 AM

## 2017-03-29 NOTE — Progress Notes (Signed)
[employer] [facility] [place] [facility]                                                                                                                                                                                  Patient Demographics   Marco RegisterMichael [name], is a 62 y.o. male, DOB - 1955-07-05, ZOX:[id number]  Admit date - 03/25/2017   Admitting Physician Arnaldo NatalMichael S Diamond, MD  Outpatient Primary MD for the patient is Marco MinaHedrick, James, MD   LOS - 3  Subjective: Patient still very short of breath but more awake  Review of Systems:   Constitutional: Positive for malaise/fatigue and weight loss. Negative for chills and fever.  HENT: Negative.  Negative for ear discharge, ear pain, hearing loss, nosebleeds and sore throat.   Eyes: Negative.  Negative for blurred vision and pain.  Respiratory: Positive for cough and shortness of breath. Negative for hemoptysis and wheezing.   Cardiovascular: Negative.  Negative for chest pain, palpitations and leg swelling.  Gastrointestinal: Negative.  Negative for abdominal pain, blood in stool, diarrhea, nausea and vomiting.  Genitourinary: Negative.  Negative for dysuria.  Musculoskeletal: Negative.  Negative for back pain.  Skin: Negative.   Neurological: Negative for dizziness, tremors, speech change, focal weakness, seizures and headaches.  Endo/Heme/Allergies: Negative.  Does not bruise/bleed easily.  Psychiatric/Behavioral: Positive for memory loss. Negative for depression, hallucinations and suicidal ideas.   Vitals:   Vitals:   03/29/17 0404 03/29/17 0740 03/29/17 1115 03/29/17 1211  BP: (!) 166/95  (!) 157/86 (!) 149/85  Pulse: (!) 103  (!) 105 (!) 106  Resp: (!) 25     Temp: 97.9 F (36.6 C)   97.8 F (36.6 C)  TempSrc: Oral   Oral  SpO2: 100% 93%  93%  Weight:      Height:        Wt Readings from Last 3 Encounters:  03/29/17 155 lb 3.3 oz (70.4 kg)  03/24/17 165 lb 6.4 oz (75 kg)  03/17/17 140 lb (63.5 kg)      Intake/Output Summary (Last 24 hours) at 03/29/2017 1335 Last data filed at 03/29/2017 1143 Gross per 24 hour  Intake 917 ml  Output 2700 ml  Net -1783 ml    Physical Exam:   GENERAL: Improved mild respiratory distress HEAD, EYES, EARS, NOSE AND THROAT: Atraumatic, normocephalic. Extraocular muscles are intact. Pupils equal and reactive to light. Sclerae anicteric. No conjunctival injection. No oro-pharyngeal erythema.  NECK: Supple. There is no jugular venous distention. No bruits, no lymphadenopathy, no thyromegaly.  HEART: Regular rate and rhythm,. No murmurs, no rubs, no clicks.  LUNGS: Decreased breath sounds bilaterally positive accessory muscle usage ABDOMEN: Soft,  flat, nontender, nondistended. Has good bowel sounds. No hepatosplenomegaly appreciated.  EXTREMITIES: No evidence of any cyanosis, clubbing, or peripheral edema.  +2 pedal and radial pulses bilaterally.  NEUROLOGIC: Lethargic SKIN: Moist and warm with no rashes appreciated.  Psych: Not anxious, depressed LN: No inguinal LN enlargement    Antibiotics   Anti-infectives (From admission, onward)   Start     Dose/Rate Route Frequency Ordered Stop   03/29/17 1400  Ampicillin-Sulbactam (UNASYN) 3 g in sodium chloride 0.9 % 100 mL IVPB     3 g 200 mL/hr over 30 Minutes Intravenous Every 6 hours 03/29/17 1324     03/26/17 1000  ceFEPIme (MAXIPIME) 2 g in sodium chloride 0.9 % 100 mL IVPB  Status:  Discontinued     2 g 200 mL/hr over 30 Minutes Intravenous Every 8 hours 03/26/17 0237 03/29/17 1324   03/26/17 0900  vancomycin (VANCOCIN) IVPB 1000 mg/200 mL premix  Status:  Discontinued     1,000 mg 200 mL/hr over 60 Minutes Intravenous Every 12 hours 03/26/17 0410 03/26/17 1029   03/26/17 0115  ceFEPIme (MAXIPIME) 2 g in sodium chloride 0.9 % 100 mL IVPB     2 g 200 mL/hr over 30 Minutes Intravenous  Once 03/26/17 0100 03/26/17 0222   03/26/17 0115  vancomycin (VANCOCIN) IVPB 1000 mg/200 mL premix     1,000  mg 200 mL/hr over 60 Minutes Intravenous  Once 03/26/17 0100 03/26/17 0630   03/26/17 0000  Ampicillin-Sulbactam (UNASYN) 3 g in sodium chloride 0.9 % 100 mL IVPB  Status:  Discontinued     3 g 200 mL/hr over 30 Minutes Intravenous Every 6 hours 03/25/17 2325 03/26/17 0148      Medications   Scheduled Meds: . budesonide (PULMICORT) nebulizer solution  0.25 mg Nebulization BID  . docusate sodium  100 mg Oral BID  . enoxaparin (LOVENOX) injection  40 mg Subcutaneous Q24H  . feeding supplement (ENSURE ENLIVE)  237 mL Oral TID BM  . folic acid  1 mg Oral Daily  . furosemide  40 mg Intravenous Q12H  . ipratropium-albuterol  3 mL Nebulization Q4H  . levETIRAcetam  750 mg Oral BID  . methylPREDNISolone (SOLU-MEDROL) injection  40 mg Intravenous Q6H  . metoprolol tartrate  12.5 mg Oral BID  . multivitamin with minerals  1 tablet Oral Daily  . multivitamin-lutein  1 capsule Oral Daily  . nicotine  21 mg Transdermal Daily  . [START ON 03/30/2017] pantoprazole  40 mg Oral Daily  . tamsulosin  0.4 mg Oral Daily  . thiamine  100 mg Oral Daily  . cyanocobalamin  2,000 mcg Oral Daily  . vitamin C  250 mg Oral BID   Continuous Infusions: . ampicillin-sulbactam (UNASYN) IV     PRN Meds:.acetaminophen **OR** acetaminophen, hydrALAZINE, labetalol, levalbuterol, metoprolol tartrate, ondansetron **OR** ondansetron (ZOFRAN) IV   Data Review:   Micro Results Recent Results (from the past 240 hour(s))  Culture, blood (Routine x 2)     Status: None   Collection Time: 03/21/17  1:41 PM  Result Value Ref Range Status   Specimen Description BLOOD RIGHT [facility]  Final   Special Requests   Final    BOTTLES DRAWN AEROBIC AND ANAEROBIC Blood Culture adequate volume   Culture   Final    NO GROWTH 5 DAYS Performed at [facility], [address] [place], [place] [address]    Report Status 03/26/2017 FINAL  Final  Culture, blood (Routine x 2)  Status: None   Collection Time: 03/21/17  1:41  PM  Result Value Ref Range Status   Specimen Description BLOOD  LEFT HAND  Final   Special Requests   Final    BOTTLES DRAWN AEROBIC AND ANAEROBIC Blood Culture results may not be optimal due to an inadequate volume of blood received in culture bottles   Culture   Final    NO GROWTH 5 DAYS Performed at [facility], [address] [place], [place] [address]    Report Status 03/26/2017 FINAL  Final  MRSA PCR Screening     Status: None   Collection Time: 03/21/17  6:21 PM  Result Value Ref Range Status   MRSA by PCR NEGATIVE NEGATIVE Final    Comment:        The GeneXpert MRSA Assay (FDA approved for NASAL specimens only), is one component of a comprehensive MRSA colonization surveillance program. It is not intended to diagnose MRSA infection nor to guide or monitor treatment for MRSA infections. Performed at [facility], [address] [place], [place] [address]   MRSA PCR Screening     Status: None   Collection Time: 03/26/17  6:34 AM  Result Value Ref Range Status   MRSA by PCR NEGATIVE NEGATIVE Final    Comment:        The GeneXpert MRSA Assay (FDA approved for NASAL specimens only), is one component of a comprehensive MRSA colonization surveillance program. It is not intended to diagnose MRSA infection nor to guide or monitor treatment for MRSA infections. Performed at [facility], [address], [place], [place] [address]     Radiology Reports Dg Chest 1 View  Result Date: 03/23/2017 CLINICAL DATA:  Shortness of breath for the past 3 days.  Smoker. EXAM: CHEST  1 VIEW COMPARISON:  03/21/2017. FINDINGS: Interval borderline enlargement of the cardiac silhouette and extensive right lung airspace opacity. The interstitial markings on the left are prominent with faint airspace opacities with mild progression. No pleural fluid seen. Thoracolumbar spine degenerative changes. IMPRESSION: 1. Significant worsening of airspace opacity  throughout the right lung, suspicious for pneumonia. 2. Mild increase in prominence of the interstitial markings and faint airspace opacities on the left, also suspicious for pneumonia. 3. Interval borderline cardiomegaly. This is accentuated by the portable AP technique and poor inspiration. Electronically Signed   By: Beckie Salts M.D.   On: 03/23/2017 14:10   Dg Chest 2 View  Result Date: 03/28/2017 CLINICAL DATA:  SOB EXAM: CHEST - 2 VIEW COMPARISON:  03/27/2017 FINDINGS: Bilateral diffuse interstitial and alveolar airspace opacities. No pleural effusion or pneumothorax. Normal cardiomediastinal silhouette. No acute osseous abnormality. IMPRESSION: 1. Bilateral diffuse interstitial and alveolar airspace opacities which may reflect pulmonary edema versus multilobar pneumonia. Electronically Signed   By: Elige Ko   On: 03/28/2017 10:34   Dg Chest 2 View  Result Date: 03/25/2017 CLINICAL DATA:  Shortness of Breath EXAM: CHEST - 2 VIEW COMPARISON:  03/23/2017 FINDINGS: Small bilateral pleural effusions. Patchy bilateral airspace opacities are similar to prior study, compatible with pneumonia. Heart is normal size. IMPRESSION: Bilateral airspace opacities, right greater than left are similar prior study, compatible with multifocal pneumonia. Small bilateral effusions. Electronically Signed   By: Charlett Nose M.D.   On: 03/25/2017 20:56   Dg Chest 2 View  Result Date: 03/21/2017 CLINICAL DATA:  Not feeling well.  Shortness of breath. EXAM: CHEST - 2 VIEW COMPARISON:  03/17/2017. FINDINGS: BILATERAL airspace opacities have worsened RIGHT  greater than LEFT since the prior radiograph consistent with pneumonia. No effusion or pneumothorax. No significant lobar atelectasis. Normal heart size. No bony abnormality. Suspected COPD. IMPRESSION: Worsening aeration suggesting BILATERAL pneumonia, worse on the RIGHT. Electronically Signed   By: Elsie Stain M.D.   On: 03/21/2017 14:06   Dg Chest 2 View  Result  Date: 03/04/2017 CLINICAL DATA:  Cough EXAM: CHEST  2 VIEW COMPARISON:  11/10/2016 FINDINGS: Normal heart size and mediastinal contours. Borderline hyperinflation. No acute infiltrate or edema. No effusion or pneumothorax. No acute osseous findings. IMPRESSION: Negative chest. Electronically Signed   By: Marnee Spring M.D.   On: 03/04/2017 15:20   Ct Head Wo Contrast  Result Date: 03/17/2017 CLINICAL DATA:  Chest pain starting at 1830 hours with dyspnea. Altered level of consciousness. Patient could not recognize family members. EXAM: CT HEAD WITHOUT CONTRAST TECHNIQUE: Contiguous axial images were obtained from the base of the skull through the vertex without intravenous contrast. COMPARISON:  03/04/2017 FINDINGS: Brain: Stable superficial and central atrophy with chronic appearing small vessel ischemic disease. No acute intracranial hemorrhage midline shift or edema. No intra-axial mass nor extra-axial fluid collections. Midline fourth ventricle and basal cisterns. Vascular: No hyperdense vessel sign. Skull: No acute skull fracture or suspicious osseous lesions. Sinuses/Orbits: Stable findings of polypoid mucosal thickening involving the medial right maxillary sinus. Partial opacification of left mastoid air cells also chronic. Other: None IMPRESSION: 1. Stable involutional changes of the brain with small vessel ischemic disease. No acute intracranial abnormality. 2. Stable polypoid opacification medial aspect right maxillary sinus potentially represent small polyp or mucous retention cyst. Chronic left mastoid effusion. Electronically Signed   By: Tollie Eth M.D.   On: 03/17/2017 21:38   Ct Head Wo Contrast  Result Date: 03/04/2017 CLINICAL DATA:  62 year old male with daily alcohol intake. Seizures, on seizure medication. Somnolence and confusion. Initial encounter. EXAM: CT HEAD WITHOUT CONTRAST TECHNIQUE: Contiguous axial images were obtained from the base of the skull through the vertex without  intravenous contrast. COMPARISON:  11/11/2013 head CT. FINDINGS: Brain: No intracranial hemorrhage or CT evidence of large acute infarct. Moderate global atrophy. No intracranial mass lesion noted on this unenhanced exam. Vascular: Minimal vascular calcifications Skull: No acute abnormality. Sinuses/Orbits: No acute orbital abnormality. Mild polypoid opacification medial aspect right maxillary sinus. Remainder of visualized paranasal sinuses are clear. Other: Partial opacification left mastoid air cells without obstructing lesion of the eustachian tube noted. IMPRESSION: No acute intracranial abnormality noted. Moderate global atrophy. Polypoid opacification medial aspect right maxillary sinus and partial opacification left mastoid air cells. Electronically Signed   By: Lacy Duverney M.D.   On: 03/04/2017 17:57   Ct Chest W Contrast  Result Date: 03/04/2017 CLINICAL DATA:  Unintended weight loss. Nonlocalized abdominal pain. Recent nausea and vomiting. History of daily ETOH use and seizures. EXAM: CT CHEST, ABDOMEN, AND PELVIS WITH CONTRAST TECHNIQUE: Multidetector CT imaging of the chest, abdomen and pelvis was performed following the standard protocol during bolus administration of intravenous contrast. CONTRAST:  ISOVUE-300 IOPAMIDOL (ISOVUE-300) INJECTION 61% COMPARISON:  CT AP 08/06/2009 FINDINGS: CT CHEST FINDINGS Cardiovascular: Normal branch pattern of the great vessels. No aortic aneurysm or dissection. No large central pulmonary embolus. Normal sized heart without pericardial effusion. Minimal coronary arteriosclerosis along the circumflex. Mediastinum/Nodes: No enlarged mediastinal, hilar, or axillary lymph nodes. Thyroid gland, trachea, and esophagus demonstrate no significant findings. Lungs/Pleura: Minimal scarring and/or atelectasis at the apices. Faint nonspecific ground-glass opacities in both upper lobes and lingula with subpleural  areas of atelectasis and/or scarring in the right middle  lobe. No effusion or pneumothorax. Musculoskeletal: Bilateral gynecomastia. CT ABDOMEN PELVIS FINDINGS Hepatobiliary: Subtle surface nodularity of the liver that may reflect morphologic changes of cirrhosis. A pair of adjacent subcentimeter hypodensities in the right hepatic lobe are noted compatible with cysts or hemangiomata. A third similar subcentimeter hypodensity is seen further caudad anteriorly within the right hepatic lobe measuring up to 8 mm. No biliary dilatation. Normal gallbladder. Pancreas: No ductal dilatation, inflammation or mass. Spleen: Normal Adrenals/Urinary Tract: Normal bilateral adrenal glands. Symmetric enhancement of both kidneys without obstructive uropathy. Normal bladder. Stomach/Bowel: Small hiatal hernia. Physiologic distention of the stomach. Mild small bowel distention with fluid with slight fold thickening in the central abdomen compatible with an enteritis. No mechanical source of obstruction is seen. Decompressed descending colon through rectum. Liquid stool noted within the cecum and ascending colon. No evidence of appendicitis. Vascular/Lymphatic: Aortoiliac and branch vessel atherosclerosis without aneurysm or dissection. Reproductive: Prostate is unremarkable. Other: No abdominal wall hernia or abnormality. No abdominopelvic ascites. Musculoskeletal: Degenerative disc disease L5-S1. No acute nor suspicious osseous abnormality. IMPRESSION: 1. Nonspecific faint ground-glass opacities are seen in both upper lobes left greater than right as well as lingula. Findings may represent small foci of alveolitis or pneumonitis are nonspecific. 2. Minimal coronary arteriosclerosis. 3. Mild fluid distention of small bowel without mechanical obstruction suspicious for mild small bowel enteritis. 4. Morphologic appearance of cirrhosis. 5. Three subcentimeter hypodensities in the right hepatic lobe are noted too small to further characterize but statistically consistent with cysts or  hemangiomata. 6. Degenerative disc disease L5-S1. Electronically Signed   By: Tollie Eth M.D.   On: 03/04/2017 18:02   Ct Angio Chest Pe W/cm &/or Wo Cm  Result Date: 03/26/2017 CLINICAL DATA:  62 year old male with recent discharge from the hospital for pneumonia and sepsis. Worsening of shortness of breath and confusion. EXAM: CT ANGIOGRAPHY CHEST CT ABDOMEN AND PELVIS WITH CONTRAST TECHNIQUE: Multidetector CT imaging of the chest was performed using the standard protocol during bolus administration of intravenous contrast. Multiplanar CT image reconstructions and MIPs were obtained to evaluate the vascular anatomy. Multidetector CT imaging of the abdomen and pelvis was performed using the standard protocol during bolus administration of intravenous contrast. CONTRAST:  ISOVUE-370 IOPAMIDOL (ISOVUE-370) INJECTION 76% COMPARISON:  Chest CT dated 03/04/2017 FINDINGS: CTA CHEST FINDINGS Cardiovascular: There is no cardiomegaly or pericardial effusion. The thoracic aorta is unremarkable. The origins of the great vessels of the aortic arch appear patent. There is no CT evidence of pulmonary embolism. Mediastinum/Nodes: Bilateral hilar and mediastinal adenopathy, reactive. Right hilar lymph node measures 13 mm in short axis. The esophagus is grossly unremarkable. No mediastinal fluid collection. Lungs/Pleura: There are extensive bilateral patchy ground-glass opacity and interlobular septal thickening with upper lobe predominant and new compared to the prior CT. Differentials include ARDS, bacterial pneumonia, acute interstitial pneumonia, or pulmonary alveolar proteinosis. Other etiologies are not excluded. Clinical correlation and pulmonary consult is advised. There is moderate bilateral pleural effusions, new since the prior CT with associated partial compressive atelectasis of the lower lobes. No pneumothorax. The central airways are patent. Musculoskeletal: There is diffuse subcutaneous edema and  anasarca. The osseous structures are intact. Review of the MIP images confirms the above findings. CT ABDOMEN and PELVIS FINDINGS No intra-abdominal free air.  Small ascites. Hepatobiliary: Indeterminate 15 mm hypodense lesion in the right lobe of the liver similar to prior CT. No intrahepatic biliary ductal dilatation. The gallbladder is unremarkable.  Pancreas: Unremarkable. No pancreatic ductal dilatation or surrounding inflammatory changes. Spleen: Normal in size without focal abnormality. Adrenals/Urinary Tract: The adrenal glands, kidneys, and the visualized ureters appear unremarkable. The urinary bladder is decompressed around a Foley catheter. Air within the bladder introduced via catheter. Stomach/Bowel: There is no bowel obstruction or active inflammation. Normal appendix. Vascular/Lymphatic: Mild aortoiliac atherosclerotic disease. No portal venous gas. There is no adenopathy. Reproductive: The prostate and seminal vesicles are grossly unremarkable. Other: Diffuse subcutaneous edema and anasarca. Musculoskeletal: Degenerative changes of the spine. No acute osseous pathology. Review of the MIP images confirms the above findings. IMPRESSION: 1. Diffuse bilateral airspace density and interlobular septal prominence with a "crazy paving" appearance and new since the prior CT. Differentials include ARDS, bacterial pneumonia, acute interstitial pneumonitis, or pulmonary alveolar proteinosis. Clinical correlation and pulmonary consult is advised. 2. Interval development of moderate size bilateral pleural effusions with associated partial compressive atelectasis of the lower lobes. 3. No CT evidence of pulmonary artery embolus. 4. Small ascites and anasarca. Electronically Signed   By: Elgie Collard M.D.   On: 03/26/2017 05:04   Ct Abdomen Pelvis W Contrast  Result Date: 03/26/2017 CLINICAL DATA:  62 year old male with recent discharge from the hospital for pneumonia and sepsis. Worsening of shortness of  breath and confusion. EXAM: CT ANGIOGRAPHY CHEST CT ABDOMEN AND PELVIS WITH CONTRAST TECHNIQUE: Multidetector CT imaging of the chest was performed using the standard protocol during bolus administration of intravenous contrast. Multiplanar CT image reconstructions and MIPs were obtained to evaluate the vascular anatomy. Multidetector CT imaging of the abdomen and pelvis was performed using the standard protocol during bolus administration of intravenous contrast. CONTRAST:  ISOVUE-370 IOPAMIDOL (ISOVUE-370) INJECTION 76% COMPARISON:  Chest CT dated 03/04/2017 FINDINGS: CTA CHEST FINDINGS Cardiovascular: There is no cardiomegaly or pericardial effusion. The thoracic aorta is unremarkable. The origins of the great vessels of the aortic arch appear patent. There is no CT evidence of pulmonary embolism. Mediastinum/Nodes: Bilateral hilar and mediastinal adenopathy, reactive. Right hilar lymph node measures 13 mm in short axis. The esophagus is grossly unremarkable. No mediastinal fluid collection. Lungs/Pleura: There are extensive bilateral patchy ground-glass opacity and interlobular septal thickening with upper lobe predominant and new compared to the prior CT. Differentials include ARDS, bacterial pneumonia, acute interstitial pneumonia, or pulmonary alveolar proteinosis. Other etiologies are not excluded. Clinical correlation and pulmonary consult is advised. There is moderate bilateral pleural effusions, new since the prior CT with associated partial compressive atelectasis of the lower lobes. No pneumothorax. The central airways are patent. Musculoskeletal: There is diffuse subcutaneous edema and anasarca. The osseous structures are intact. Review of the MIP images confirms the above findings. CT ABDOMEN and PELVIS FINDINGS No intra-abdominal free air.  Small ascites. Hepatobiliary: Indeterminate 15 mm hypodense lesion in the right lobe of the liver similar to prior CT. No intrahepatic biliary ductal  dilatation. The gallbladder is unremarkable. Pancreas: Unremarkable. No pancreatic ductal dilatation or surrounding inflammatory changes. Spleen: Normal in size without focal abnormality. Adrenals/Urinary Tract: The adrenal glands, kidneys, and the visualized ureters appear unremarkable. The urinary bladder is decompressed around a Foley catheter. Air within the bladder introduced via catheter. Stomach/Bowel: There is no bowel obstruction or active inflammation. Normal appendix. Vascular/Lymphatic: Mild aortoiliac atherosclerotic disease. No portal venous gas. There is no adenopathy. Reproductive: The prostate and seminal vesicles are grossly unremarkable. Other: Diffuse subcutaneous edema and anasarca. Musculoskeletal: Degenerative changes of the spine. No acute osseous pathology. Review of the MIP images confirms the above findings. IMPRESSION:  1. Diffuse bilateral airspace density and interlobular septal prominence with a "crazy paving" appearance and new since the prior CT. Differentials include ARDS, bacterial pneumonia, acute interstitial pneumonitis, or pulmonary alveolar proteinosis. Clinical correlation and pulmonary consult is advised. 2. Interval development of moderate size bilateral pleural effusions with associated partial compressive atelectasis of the lower lobes. 3. No CT evidence of pulmonary artery embolus. 4. Small ascites and anasarca. Electronically Signed   By: Elgie Collard M.D.   On: 03/26/2017 05:04   Ct Abdomen Pelvis W Contrast  Result Date: 03/04/2017 CLINICAL DATA:  Unintended weight loss. Nonlocalized abdominal pain. Recent nausea and vomiting. History of daily ETOH use and seizures. EXAM: CT CHEST, ABDOMEN, AND PELVIS WITH CONTRAST TECHNIQUE: Multidetector CT imaging of the chest, abdomen and pelvis was performed following the standard protocol during bolus administration of intravenous contrast. CONTRAST:  ISOVUE-300 IOPAMIDOL (ISOVUE-300) INJECTION 61% COMPARISON:  CT AP  08/06/2009 FINDINGS: CT CHEST FINDINGS Cardiovascular: Normal branch pattern of the great vessels. No aortic aneurysm or dissection. No large central pulmonary embolus. Normal sized heart without pericardial effusion. Minimal coronary arteriosclerosis along the circumflex. Mediastinum/Nodes: No enlarged mediastinal, hilar, or axillary lymph nodes. Thyroid gland, trachea, and esophagus demonstrate no significant findings. Lungs/Pleura: Minimal scarring and/or atelectasis at the apices. Faint nonspecific ground-glass opacities in both upper lobes and lingula with subpleural areas of atelectasis and/or scarring in the right middle lobe. No effusion or pneumothorax. Musculoskeletal: Bilateral gynecomastia. CT ABDOMEN PELVIS FINDINGS Hepatobiliary: Subtle surface nodularity of the liver that may reflect morphologic changes of cirrhosis. A pair of adjacent subcentimeter hypodensities in the right hepatic lobe are noted compatible with cysts or hemangiomata. A third similar subcentimeter hypodensity is seen further caudad anteriorly within the right hepatic lobe measuring up to 8 mm. No biliary dilatation. Normal gallbladder. Pancreas: No ductal dilatation, inflammation or mass. Spleen: Normal Adrenals/Urinary Tract: Normal bilateral adrenal glands. Symmetric enhancement of both kidneys without obstructive uropathy. Normal bladder. Stomach/Bowel: Small hiatal hernia. Physiologic distention of the stomach. Mild small bowel distention with fluid with slight fold thickening in the central abdomen compatible with an enteritis. No mechanical source of obstruction is seen. Decompressed descending colon through rectum. Liquid stool noted within the cecum and ascending colon. No evidence of appendicitis. Vascular/Lymphatic: Aortoiliac and branch vessel atherosclerosis without aneurysm or dissection. Reproductive: Prostate is unremarkable. Other: No abdominal wall hernia or abnormality. No abdominopelvic ascites. Musculoskeletal:  Degenerative disc disease L5-S1. No acute nor suspicious osseous abnormality. IMPRESSION: 1. Nonspecific faint ground-glass opacities are seen in both upper lobes left greater than right as well as lingula. Findings may represent small foci of alveolitis or pneumonitis are nonspecific. 2. Minimal coronary arteriosclerosis. 3. Mild fluid distention of small bowel without mechanical obstruction suspicious for mild small bowel enteritis. 4. Morphologic appearance of cirrhosis. 5. Three subcentimeter hypodensities in the right hepatic lobe are noted too small to further characterize but statistically consistent with cysts or hemangiomata. 6. Degenerative disc disease L5-S1. Electronically Signed   By: Tollie Eth M.D.   On: 03/04/2017 18:02   Dg Chest Port 1 View  Result Date: 03/27/2017 CLINICAL DATA:  Clinically suspected pneumonia. EXAM: PORTABLE CHEST 1 VIEW COMPARISON:  Portable chest x-ray of March 26, 2017 FINDINGS: The lungs are well-expanded. The interstitial markings remain increased diffusely. Patchy airspace opacity is noted especially on the right. The heart is normal in size. The pulmonary vascularity is indistinct. The mediastinum is normal in width. There is no pleural effusion. The bony thorax is unremarkable. IMPRESSION: Persistent  confluent interstitial and some airspace opacity bilaterally. Findings are consistent with pneumonia, edema, or ARDS in the appropriate clinical setting. Electronically Signed   By: David  [place] M.D.   On: 03/27/2017 14:00   Dg Chest Port 1 View  Result Date: 03/26/2017 CLINICAL DATA:  Pneumonia and sepsis EXAM: PORTABLE CHEST 1 VIEW COMPARISON:  03/25/2017, 03/23/2017, 03/17/2017, CT chest 03/04/2017, 03/26/2017 FINDINGS: Fairly extensive diffuse bilateral interstitial and ground-glass opacity. Small pleural effusions. Stable cardiomediastinal silhouette. No pneumothorax. IMPRESSION: Extensive interstitial and ground-glass opacity which may reflect edema, diffuse  pneumonia, or ARDS. Moderate pleural effusions on CT are not well demonstrated on portable view of the chest. Electronically Signed   By: Jasmine Pang M.D.   On: 03/26/2017 18:30   Dg Chest Portable 1 View  Result Date: 03/17/2017 CLINICAL DATA:  Chest pain starting at 1830 hours. EXAM: PORTABLE CHEST 1 VIEW COMPARISON:  03/04/2017 CXR FINDINGS: Portable AP semi upright view. Heart size is mildly enlarged but this may be due to the portable technique and slightly low lung volumes. No overt pulmonary edema, pneumonic consolidation or effusion. There is bibasilar as well as right upper lobe atelectasis. No acute nor suspicious osseous abnormalities. IMPRESSION: Bibasilar and right upper lobe atelectasis. Borderline cardiomegaly. Slightly low lung volumes. Electronically Signed   By: Tollie Eth M.D.   On: 03/17/2017 21:33     CBC Recent Labs  Lab 03/25/17 2024 03/27/17 0629 03/28/17 0553  WBC 11.6* 8.2 10.9*  HGB 10.7* 9.6* 9.9*  HCT 32.4* 28.4* 30.0*  PLT 281 246 282  MCV 99.6 99.0 101.0*  MCH 33.0 33.4 33.2  MCHC 33.1 33.8 32.9  RDW 14.6* 15.2* 15.3*  LYMPHSABS  --  0.2* 0.2*  MONOABS  --  0.4 0.4  EOSABS  --  0.0 0.0  BASOSABS  --  0.0 0.0    Chemistries  Recent Labs  Lab 03/22/17 1521  03/23/17 0733 03/24/17 0612 03/25/17 2024 03/26/17 0520 03/27/17 0629 03/28/17 0553 03/29/17 0630  NA  --    < > 136 136 137  --  140 143 141  K  --    < > 3.9 4.3 4.2  --  3.4* 4.2 4.2  CL  --    < > 113* 112* 107  --  108 111 105  CO2  --    < > 18* 18* 21*  --  22 23 26   GLUCOSE  --    < > 83 136* 97  --  133* 123* 135*  BUN  --    < > 12 15 16   --  22* 32* 41*  CREATININE  --    < > 0.97 0.91 0.83  --  0.88 1.02 1.13  CALCIUM  --    < > 8.1* 8.4* 8.8*  --  8.6* 9.0 9.2  MG 1.8  --  1.9 2.0  --   --  2.2 2.5*  --   AST  --   --   --   --   --  108*  --   --   --   ALT  --   --   --   --   --  36  --   --   --   ALKPHOS  --   --   --   --   --  212*  --   --   --   BILITOT  --    --   --   --   --  1.4*  --   --   --    < > = values in this interval not displayed.   ------------------------------------------------------------------------------------------------------------------ estimated creatinine clearance is 61.9 mL/min (by C-G formula based on SCr of 1.13 mg/dL). ------------------------------------------------------------------------------------------------------------------ No results for input(s): HGBA1C in the last 72 hours. ------------------------------------------------------------------------------------------------------------------ No results for input(s): CHOL, HDL, LDLCALC, TRIG, CHOLHDL, LDLDIRECT in the last 72 hours. ------------------------------------------------------------------------------------------------------------------ No results for input(s): TSH, T4TOTAL, T3FREE, THYROIDAB in the last 72 hours.  Invalid input(s): FREET3 ------------------------------------------------------------------------------------------------------------------ No results for input(s): VITAMINB12, FOLATE, FERRITIN, TIBC, IRON, RETICCTPCT in the last 72 hours.  Coagulation profile Recent Labs  Lab 03/26/17 0631  INR 1.18    No results for input(s): DDIMER in the last 72 hours.  Cardiac Enzymes Recent Labs  Lab 03/25/17 2024 03/25/17 2221  TROPONINI 0.03* <0.03   ------------------------------------------------------------------------------------------------------------------ Invalid input(s): POCBNP    Assessment & Plan   This is a 62 year old male admitted for pneumonia.  1.    Acute hypoxic respiratory failure due to B/L pneumonia possible ARDS Change antibiotics to Unasyn 2. Acute respiratory failure  With ARDS from persistent bilateral HCAP /Aspiration pneumonia-  Continue IV antibiotics continue steroids slow to improve 3. Sepsis: present on admission, improving on Abx 4. Uncontrolled hypertension continue metoprolol labetalol as needed,  blood pressure now stable 5.  Hx of seizure disorder continue Keppra 6. Hx Nicotine dependency smoking cessation provided 4 minutes spent nicotine patch offered strongly recommend he stop smoking        Code Status Orders  (From admission, onward)        Start     Ordered   03/26/17 0630  Full code  Continuous     03/26/17 0629    Code Status History    Date Active Date Inactive Code Status Order ID Comments User Context   03/21/2017 1813 03/24/2017 2058 Full Code [id number]  Adrian Saran, MD Inpatient   03/13/2017 1004 03/14/2017 1346 Full Code [id number]  Enid Baas, MD Inpatient   03/12/2017 1606 03/13/2017 1004 DNR [id number]  Ihor Austin, MD Inpatient   03/04/2017 1923 03/04/2017 2355 DNR [id number]  Alford Highland, MD ED           Consults pulmonary critical care  DVT Prophylaxis  Lovenox   Lab Results  Component Value Date   PLT 282 03/28/2017     Time Spent in minutes 34 minutes   Greater than 50% of time spent in care coordination and counseling patient regarding the condition and plan of care.   Auburn Bilberry M.D on 03/29/2017 at 1:35 PM  Between 7am to 6pm - Pager - [phone]  After 6pm go to [email or web address] - Social research officer, government  [employer]   Office  [phone]

## 2017-03-29 NOTE — Care Management Important Message (Signed)
Important Message  Patient Details  Name: Marco [name] MRN: [id number] Date of Birth: 1955-03-02   Medicare Important Message Given:  Yes Signed IM notice given   Eber HongGreene, Preslyn Warr R, RN 03/29/2017, 8:04 AM

## 2017-03-29 NOTE — Progress Notes (Signed)
Pt refusing bipap again for 2nd night in a row.

## 2017-03-29 NOTE — Progress Notes (Signed)
Notified Dr. Allena KatzPatel of 5 beat run of SVT. RN to administer prn metoprolol IV. Per MD okay to add order parameter to maintain O2 89% or greater.

## 2017-03-30 LAB — VITAMIN B12: VITAMIN B 12: 728 pg/mL (ref 180–914)

## 2017-03-30 LAB — AMMONIA: Ammonia: 14 umol/L (ref 9–35)

## 2017-03-30 NOTE — Progress Notes (Signed)
Speech Therapy Note: reviewed chart notes noting MD's indication of Acute hypoxic respiratory failure due to B/L pneumonia possible ARDS. Met w/ pt(family in room) who reported toleration of oral intake including thin liquids w/ no coughing or choking - no reports of such in chart as well. Labs and O2 reviewed in chart; recent CXR. Pt is currently on a mech soft w/ thin liquids using general aspiration precautions BUT NO STRAWS is recommended. Reviewed w/ pt the need to take SMALL, SINGLE sips by Cup for safer drinking and swallowing; Pills in Puree for safer swallowing. Pt agreed.  MD/NSG to reconsult ST services if any decline in status while admitted. Pt agreed.   Orinda Kenner, [place], CCC-SLP

## 2017-03-30 NOTE — Progress Notes (Signed)
[employer] -  at [facility]                                                                                                                                                                                  Patient Demographics   Marco [name], is a 62 y.o. male, DOB - [birth year], UJW:[id number]  Admit date - 03/25/2017   Admitting Physician Arnaldo NatalMichael S Diamond, MD  Outpatient Primary MD for the patient is Jerl MinaHedrick, James, MD   LOS - 4  Subjective: Patient's breathing seems to have improved.  Continues to put out good amount of urine  Review of Systems:   Constitutional: Positive for malaise/fatigue and weight loss. Negative for chills and fever.  HENT: Negative.  Negative for ear discharge, ear pain, hearing loss, nosebleeds and sore throat.   Eyes: Negative.  Negative for blurred vision and pain.  Respiratory: Positive for cough and shortness of breath. Negative for hemoptysis and wheezing.   Cardiovascular: Negative.  Negative for chest pain, palpitations and leg swelling.  Gastrointestinal: Negative.  Negative for abdominal pain, blood in stool, diarrhea, nausea and vomiting.  Genitourinary: Negative.  Negative for dysuria.  Musculoskeletal: Negative.  Negative for back pain.  Skin: Negative.   Neurological: Negative for dizziness, tremors, speech change, focal weakness, seizures and headaches.  Endo/Heme/Allergies: Negative.  Does not bruise/bleed easily.  Psychiatric/Behavioral: Positive for memory loss. Negative for depression, hallucinations and suicidal ideas.   Vitals:   Vitals:   03/30/17 0410 03/30/17 0452 03/30/17 0531 03/30/17 1220  BP:   (!) 149/82 133/66  Pulse:   100 (!) 104  Resp:   20 20  Temp:   97.7 F (36.5 C) (!) 97.5 F (36.4 C)  TempSrc:   Oral Oral  SpO2: 95% 95% 97% 97%  Weight:      Height:        Wt Readings from Last 3 Encounters:  03/30/17 156 lb 4.9 oz (70.9 kg)  03/24/17 165 lb 6.4 oz (75 kg)  03/17/17 140 lb (63.5  kg)     Intake/Output Summary (Last 24 hours) at 03/30/2017 1244 Last data filed at 03/30/2017 1003 Gross per 24 hour  Intake 770 ml  Output 3250 ml  Net -2480 ml    Physical Exam:   GENERAL: Improved mild respiratory distress HEAD, EYES, EARS, NOSE AND THROAT: Atraumatic, normocephalic. Extraocular muscles are intact. Pupils equal and reactive to light. Sclerae anicteric. No conjunctival injection. No oro-pharyngeal erythema.  NECK: Supple. There is no jugular venous distention. No bruits, no lymphadenopathy, no thyromegaly.  HEART: Regular rate and rhythm,. No murmurs, no rubs, no clicks.  LUNGS: Decreased breath sounds bilaterally positive accessory muscle usage  ABDOMEN: Soft, flat, nontender, nondistended. Has good bowel sounds. No hepatosplenomegaly appreciated.  EXTREMITIES: No evidence of any cyanosis, clubbing, or peripheral edema.  +2 pedal and radial pulses bilaterally.  NEUROLOGIC: Lethargic SKIN: Moist and warm with no rashes appreciated.  Psych: Not anxious, depressed LN: No inguinal LN enlargement    Antibiotics   Anti-infectives (From admission, onward)   Start     Dose/Rate Route Frequency Ordered Stop   03/29/17 1400  Ampicillin-Sulbactam (UNASYN) 3 g in sodium chloride 0.9 % 100 mL IVPB     3 g 200 mL/hr over 30 Minutes Intravenous Every 6 hours 03/29/17 1324     03/26/17 1000  ceFEPIme (MAXIPIME) 2 g in sodium chloride 0.9 % 100 mL IVPB  Status:  Discontinued     2 g 200 mL/hr over 30 Minutes Intravenous Every 8 hours 03/26/17 0237 03/29/17 1324   03/26/17 0900  vancomycin (VANCOCIN) IVPB 1000 mg/200 mL premix  Status:  Discontinued     1,000 mg 200 mL/hr over 60 Minutes Intravenous Every 12 hours 03/26/17 0410 03/26/17 1029   03/26/17 0115  ceFEPIme (MAXIPIME) 2 g in sodium chloride 0.9 % 100 mL IVPB     2 g 200 mL/hr over 30 Minutes Intravenous  Once 03/26/17 0100 03/26/17 0222   03/26/17 0115  vancomycin (VANCOCIN) IVPB 1000 mg/200 mL premix     1,000  mg 200 mL/hr over 60 Minutes Intravenous  Once 03/26/17 0100 03/26/17 0630   03/26/17 0000  Ampicillin-Sulbactam (UNASYN) 3 g in sodium chloride 0.9 % 100 mL IVPB  Status:  Discontinued     3 g 200 mL/hr over 30 Minutes Intravenous Every 6 hours 03/25/17 2325 03/26/17 0148      Medications   Scheduled Meds: . budesonide (PULMICORT) nebulizer solution  0.25 mg Nebulization BID  . docusate sodium  100 mg Oral BID  . enoxaparin (LOVENOX) injection  40 mg Subcutaneous Q24H  . feeding supplement (ENSURE ENLIVE)  237 mL Oral TID BM  . folic acid  1 mg Oral Daily  . furosemide  40 mg Intravenous Q12H  . ipratropium-albuterol  3 mL Nebulization Q4H  . levETIRAcetam  750 mg Oral BID  . methylPREDNISolone (SOLU-MEDROL) injection  40 mg Intravenous Q6H  . metoprolol tartrate  12.5 mg Oral BID  . multivitamin with minerals  1 tablet Oral Daily  . multivitamin-lutein  1 capsule Oral Daily  . nicotine  21 mg Transdermal Daily  . pantoprazole  40 mg Oral Daily  . tamsulosin  0.4 mg Oral Daily  . thiamine  100 mg Oral Daily  . cyanocobalamin  2,000 mcg Oral Daily  . vitamin C  250 mg Oral BID   Continuous Infusions: . ampicillin-sulbactam (UNASYN) IV Stopped (03/30/17 0834)   PRN Meds:.acetaminophen **OR** acetaminophen, hydrALAZINE, labetalol, levalbuterol, metoprolol tartrate, ondansetron **OR** ondansetron (ZOFRAN) IV   Data Review:   Micro Results Recent Results (from the past 240 hour(s))  Culture, blood (Routine x 2)     Status: None   Collection Time: 03/21/17  1:41 PM  Result Value Ref Range Status   Specimen Description BLOOD RIGHT [facility]  Final   Special Requests   Final    BOTTLES DRAWN AEROBIC AND ANAEROBIC Blood Culture adequate volume   Culture   Final    NO GROWTH 5 DAYS Performed at [facility], [address] [place], [place] [address]    Report Status 03/26/2017 FINAL  Final  Culture, blood (Routine x 2)  Status: None   Collection Time: 03/21/17  1:41  PM  Result Value Ref Range Status   Specimen Description BLOOD  LEFT HAND  Final   Special Requests   Final    BOTTLES DRAWN AEROBIC AND ANAEROBIC Blood Culture results may not be optimal due to an inadequate volume of blood received in culture bottles   Culture   Final    NO GROWTH 5 DAYS Performed at [facility], [address] [place], [place] [address]    Report Status 03/26/2017 FINAL  Final  MRSA PCR Screening     Status: None   Collection Time: 03/21/17  6:21 PM  Result Value Ref Range Status   MRSA by PCR NEGATIVE NEGATIVE Final    Comment:        The GeneXpert MRSA Assay (FDA approved for NASAL specimens only), is one component of a comprehensive MRSA colonization surveillance program. It is not intended to diagnose MRSA infection nor to guide or monitor treatment for MRSA infections. Performed at [facility], [address] [place], [place] [address]   MRSA PCR Screening     Status: None   Collection Time: 03/26/17  6:34 AM  Result Value Ref Range Status   MRSA by PCR NEGATIVE NEGATIVE Final    Comment:        The GeneXpert MRSA Assay (FDA approved for NASAL specimens only), is one component of a comprehensive MRSA colonization surveillance program. It is not intended to diagnose MRSA infection nor to guide or monitor treatment for MRSA infections. Performed at [facility], [address] [place], [place] [address]     Radiology Reports Dg Chest 1 View  Result Date: 03/23/2017 CLINICAL DATA:  Shortness of breath for the past 3 days.  Smoker. EXAM: CHEST  1 VIEW COMPARISON:  03/21/2017. FINDINGS: Interval borderline enlargement of the cardiac silhouette and extensive right lung airspace opacity. The interstitial markings on the left are prominent with faint airspace opacities with mild progression. No pleural fluid seen. Thoracolumbar spine degenerative changes. IMPRESSION: 1. Significant worsening of airspace opacity  throughout the right lung, suspicious for pneumonia. 2. Mild increase in prominence of the interstitial markings and faint airspace opacities on the left, also suspicious for pneumonia. 3. Interval borderline cardiomegaly. This is accentuated by the portable AP technique and poor inspiration. Electronically Signed   By: Beckie Salts M.D.   On: 03/23/2017 14:10   Dg Chest 2 View  Result Date: 03/28/2017 CLINICAL DATA:  SOB EXAM: CHEST - 2 VIEW COMPARISON:  03/27/2017 FINDINGS: Bilateral diffuse interstitial and alveolar airspace opacities. No pleural effusion or pneumothorax. Normal cardiomediastinal silhouette. No acute osseous abnormality. IMPRESSION: 1. Bilateral diffuse interstitial and alveolar airspace opacities which may reflect pulmonary edema versus multilobar pneumonia. Electronically Signed   By: Elige Ko   On: 03/28/2017 10:34   Dg Chest 2 View  Result Date: 03/25/2017 CLINICAL DATA:  Shortness of Breath EXAM: CHEST - 2 VIEW COMPARISON:  03/23/2017 FINDINGS: Small bilateral pleural effusions. Patchy bilateral airspace opacities are similar to prior study, compatible with pneumonia. Heart is normal size. IMPRESSION: Bilateral airspace opacities, right greater than left are similar prior study, compatible with multifocal pneumonia. Small bilateral effusions. Electronically Signed   By: Charlett Nose M.D.   On: 03/25/2017 20:56   Dg Chest 2 View  Result Date: 03/21/2017 CLINICAL DATA:  Not feeling well.  Shortness of breath. EXAM: CHEST - 2 VIEW COMPARISON:  03/17/2017. FINDINGS: BILATERAL airspace opacities have worsened RIGHT  greater than LEFT since the prior radiograph consistent with pneumonia. No effusion or pneumothorax. No significant lobar atelectasis. Normal heart size. No bony abnormality. Suspected COPD. IMPRESSION: Worsening aeration suggesting BILATERAL pneumonia, worse on the RIGHT. Electronically Signed   By: Elsie Stain M.D.   On: 03/21/2017 14:06   Dg Chest 2 View  Result  Date: 03/04/2017 CLINICAL DATA:  Cough EXAM: CHEST  2 VIEW COMPARISON:  11/10/2016 FINDINGS: Normal heart size and mediastinal contours. Borderline hyperinflation. No acute infiltrate or edema. No effusion or pneumothorax. No acute osseous findings. IMPRESSION: Negative chest. Electronically Signed   By: Marnee Spring M.D.   On: 03/04/2017 15:20   Ct Head Wo Contrast  Result Date: 03/17/2017 CLINICAL DATA:  Chest pain starting at 1830 hours with dyspnea. Altered level of consciousness. Patient could not recognize family members. EXAM: CT HEAD WITHOUT CONTRAST TECHNIQUE: Contiguous axial images were obtained from the base of the skull through the vertex without intravenous contrast. COMPARISON:  03/04/2017 FINDINGS: Brain: Stable superficial and central atrophy with chronic appearing small vessel ischemic disease. No acute intracranial hemorrhage midline shift or edema. No intra-axial mass nor extra-axial fluid collections. Midline fourth ventricle and basal cisterns. Vascular: No hyperdense vessel sign. Skull: No acute skull fracture or suspicious osseous lesions. Sinuses/Orbits: Stable findings of polypoid mucosal thickening involving the medial right maxillary sinus. Partial opacification of left mastoid air cells also chronic. Other: None IMPRESSION: 1. Stable involutional changes of the brain with small vessel ischemic disease. No acute intracranial abnormality. 2. Stable polypoid opacification medial aspect right maxillary sinus potentially represent small polyp or mucous retention cyst. Chronic left mastoid effusion. Electronically Signed   By: Tollie Eth M.D.   On: 03/17/2017 21:38   Ct Head Wo Contrast  Result Date: 03/04/2017 CLINICAL DATA:  62 year old male with daily alcohol intake. Seizures, on seizure medication. Somnolence and confusion. Initial encounter. EXAM: CT HEAD WITHOUT CONTRAST TECHNIQUE: Contiguous axial images were obtained from the base of the skull through the vertex without  intravenous contrast. COMPARISON:  11/11/2013 head CT. FINDINGS: Brain: No intracranial hemorrhage or CT evidence of large acute infarct. Moderate global atrophy. No intracranial mass lesion noted on this unenhanced exam. Vascular: Minimal vascular calcifications Skull: No acute abnormality. Sinuses/Orbits: No acute orbital abnormality. Mild polypoid opacification medial aspect right maxillary sinus. Remainder of visualized paranasal sinuses are clear. Other: Partial opacification left mastoid air cells without obstructing lesion of the eustachian tube noted. IMPRESSION: No acute intracranial abnormality noted. Moderate global atrophy. Polypoid opacification medial aspect right maxillary sinus and partial opacification left mastoid air cells. Electronically Signed   By: Lacy Duverney M.D.   On: 03/04/2017 17:57   Ct Chest W Contrast  Result Date: 03/04/2017 CLINICAL DATA:  Unintended weight loss. Nonlocalized abdominal pain. Recent nausea and vomiting. History of daily ETOH use and seizures. EXAM: CT CHEST, ABDOMEN, AND PELVIS WITH CONTRAST TECHNIQUE: Multidetector CT imaging of the chest, abdomen and pelvis was performed following the standard protocol during bolus administration of intravenous contrast. CONTRAST:  ISOVUE-300 IOPAMIDOL (ISOVUE-300) INJECTION 61% COMPARISON:  CT AP 08/06/2009 FINDINGS: CT CHEST FINDINGS Cardiovascular: Normal branch pattern of the great vessels. No aortic aneurysm or dissection. No large central pulmonary embolus. Normal sized heart without pericardial effusion. Minimal coronary arteriosclerosis along the circumflex. Mediastinum/Nodes: No enlarged mediastinal, hilar, or axillary lymph nodes. Thyroid gland, trachea, and esophagus demonstrate no significant findings. Lungs/Pleura: Minimal scarring and/or atelectasis at the apices. Faint nonspecific ground-glass opacities in both upper lobes and lingula with subpleural  areas of atelectasis and/or scarring in the right middle  lobe. No effusion or pneumothorax. Musculoskeletal: Bilateral gynecomastia. CT ABDOMEN PELVIS FINDINGS Hepatobiliary: Subtle surface nodularity of the liver that may reflect morphologic changes of cirrhosis. A pair of adjacent subcentimeter hypodensities in the right hepatic lobe are noted compatible with cysts or hemangiomata. A third similar subcentimeter hypodensity is seen further caudad anteriorly within the right hepatic lobe measuring up to 8 mm. No biliary dilatation. Normal gallbladder. Pancreas: No ductal dilatation, inflammation or mass. Spleen: Normal Adrenals/Urinary Tract: Normal bilateral adrenal glands. Symmetric enhancement of both kidneys without obstructive uropathy. Normal bladder. Stomach/Bowel: Small hiatal hernia. Physiologic distention of the stomach. Mild small bowel distention with fluid with slight fold thickening in the central abdomen compatible with an enteritis. No mechanical source of obstruction is seen. Decompressed descending colon through rectum. Liquid stool noted within the cecum and ascending colon. No evidence of appendicitis. Vascular/Lymphatic: Aortoiliac and branch vessel atherosclerosis without aneurysm or dissection. Reproductive: Prostate is unremarkable. Other: No abdominal wall hernia or abnormality. No abdominopelvic ascites. Musculoskeletal: Degenerative disc disease L5-S1. No acute nor suspicious osseous abnormality. IMPRESSION: 1. Nonspecific faint ground-glass opacities are seen in both upper lobes left greater than right as well as lingula. Findings may represent small foci of alveolitis or pneumonitis are nonspecific. 2. Minimal coronary arteriosclerosis. 3. Mild fluid distention of small bowel without mechanical obstruction suspicious for mild small bowel enteritis. 4. Morphologic appearance of cirrhosis. 5. Three subcentimeter hypodensities in the right hepatic lobe are noted too small to further characterize but statistically consistent with cysts or  hemangiomata. 6. Degenerative disc disease L5-S1. Electronically Signed   By: Tollie Eth M.D.   On: 03/04/2017 18:02   Ct Angio Chest Pe W/cm &/or Wo Cm  Result Date: 03/26/2017 CLINICAL DATA:  62 year old male with recent discharge from the hospital for pneumonia and sepsis. Worsening of shortness of breath and confusion. EXAM: CT ANGIOGRAPHY CHEST CT ABDOMEN AND PELVIS WITH CONTRAST TECHNIQUE: Multidetector CT imaging of the chest was performed using the standard protocol during bolus administration of intravenous contrast. Multiplanar CT image reconstructions and MIPs were obtained to evaluate the vascular anatomy. Multidetector CT imaging of the abdomen and pelvis was performed using the standard protocol during bolus administration of intravenous contrast. CONTRAST:  ISOVUE-370 IOPAMIDOL (ISOVUE-370) INJECTION 76% COMPARISON:  Chest CT dated 03/04/2017 FINDINGS: CTA CHEST FINDINGS Cardiovascular: There is no cardiomegaly or pericardial effusion. The thoracic aorta is unremarkable. The origins of the great vessels of the aortic arch appear patent. There is no CT evidence of pulmonary embolism. Mediastinum/Nodes: Bilateral hilar and mediastinal adenopathy, reactive. Right hilar lymph node measures 13 mm in short axis. The esophagus is grossly unremarkable. No mediastinal fluid collection. Lungs/Pleura: There are extensive bilateral patchy ground-glass opacity and interlobular septal thickening with upper lobe predominant and new compared to the prior CT. Differentials include ARDS, bacterial pneumonia, acute interstitial pneumonia, or pulmonary alveolar proteinosis. Other etiologies are not excluded. Clinical correlation and pulmonary consult is advised. There is moderate bilateral pleural effusions, new since the prior CT with associated partial compressive atelectasis of the lower lobes. No pneumothorax. The central airways are patent. Musculoskeletal: There is diffuse subcutaneous edema and  anasarca. The osseous structures are intact. Review of the MIP images confirms the above findings. CT ABDOMEN and PELVIS FINDINGS No intra-abdominal free air.  Small ascites. Hepatobiliary: Indeterminate 15 mm hypodense lesion in the right lobe of the liver similar to prior CT. No intrahepatic biliary ductal dilatation. The gallbladder is unremarkable.  Pancreas: Unremarkable. No pancreatic ductal dilatation or surrounding inflammatory changes. Spleen: Normal in size without focal abnormality. Adrenals/Urinary Tract: The adrenal glands, kidneys, and the visualized ureters appear unremarkable. The urinary bladder is decompressed around a Foley catheter. Air within the bladder introduced via catheter. Stomach/Bowel: There is no bowel obstruction or active inflammation. Normal appendix. Vascular/Lymphatic: Mild aortoiliac atherosclerotic disease. No portal venous gas. There is no adenopathy. Reproductive: The prostate and seminal vesicles are grossly unremarkable. Other: Diffuse subcutaneous edema and anasarca. Musculoskeletal: Degenerative changes of the spine. No acute osseous pathology. Review of the MIP images confirms the above findings. IMPRESSION: 1. Diffuse bilateral airspace density and interlobular septal prominence with a "crazy paving" appearance and new since the prior CT. Differentials include ARDS, bacterial pneumonia, acute interstitial pneumonitis, or pulmonary alveolar proteinosis. Clinical correlation and pulmonary consult is advised. 2. Interval development of moderate size bilateral pleural effusions with associated partial compressive atelectasis of the lower lobes. 3. No CT evidence of pulmonary artery embolus. 4. Small ascites and anasarca. Electronically Signed   By: Elgie Collard M.D.   On: 03/26/2017 05:04   Ct Abdomen Pelvis W Contrast  Result Date: 03/26/2017 CLINICAL DATA:  62 year old male with recent discharge from the hospital for pneumonia and sepsis. Worsening of shortness of  breath and confusion. EXAM: CT ANGIOGRAPHY CHEST CT ABDOMEN AND PELVIS WITH CONTRAST TECHNIQUE: Multidetector CT imaging of the chest was performed using the standard protocol during bolus administration of intravenous contrast. Multiplanar CT image reconstructions and MIPs were obtained to evaluate the vascular anatomy. Multidetector CT imaging of the abdomen and pelvis was performed using the standard protocol during bolus administration of intravenous contrast. CONTRAST:  ISOVUE-370 IOPAMIDOL (ISOVUE-370) INJECTION 76% COMPARISON:  Chest CT dated 03/04/2017 FINDINGS: CTA CHEST FINDINGS Cardiovascular: There is no cardiomegaly or pericardial effusion. The thoracic aorta is unremarkable. The origins of the great vessels of the aortic arch appear patent. There is no CT evidence of pulmonary embolism. Mediastinum/Nodes: Bilateral hilar and mediastinal adenopathy, reactive. Right hilar lymph node measures 13 mm in short axis. The esophagus is grossly unremarkable. No mediastinal fluid collection. Lungs/Pleura: There are extensive bilateral patchy ground-glass opacity and interlobular septal thickening with upper lobe predominant and new compared to the prior CT. Differentials include ARDS, bacterial pneumonia, acute interstitial pneumonia, or pulmonary alveolar proteinosis. Other etiologies are not excluded. Clinical correlation and pulmonary consult is advised. There is moderate bilateral pleural effusions, new since the prior CT with associated partial compressive atelectasis of the lower lobes. No pneumothorax. The central airways are patent. Musculoskeletal: There is diffuse subcutaneous edema and anasarca. The osseous structures are intact. Review of the MIP images confirms the above findings. CT ABDOMEN and PELVIS FINDINGS No intra-abdominal free air.  Small ascites. Hepatobiliary: Indeterminate 15 mm hypodense lesion in the right lobe of the liver similar to prior CT. No intrahepatic biliary ductal  dilatation. The gallbladder is unremarkable. Pancreas: Unremarkable. No pancreatic ductal dilatation or surrounding inflammatory changes. Spleen: Normal in size without focal abnormality. Adrenals/Urinary Tract: The adrenal glands, kidneys, and the visualized ureters appear unremarkable. The urinary bladder is decompressed around a Foley catheter. Air within the bladder introduced via catheter. Stomach/Bowel: There is no bowel obstruction or active inflammation. Normal appendix. Vascular/Lymphatic: Mild aortoiliac atherosclerotic disease. No portal venous gas. There is no adenopathy. Reproductive: The prostate and seminal vesicles are grossly unremarkable. Other: Diffuse subcutaneous edema and anasarca. Musculoskeletal: Degenerative changes of the spine. No acute osseous pathology. Review of the MIP images confirms the above findings. IMPRESSION:  1. Diffuse bilateral airspace density and interlobular septal prominence with a "crazy paving" appearance and new since the prior CT. Differentials include ARDS, bacterial pneumonia, acute interstitial pneumonitis, or pulmonary alveolar proteinosis. Clinical correlation and pulmonary consult is advised. 2. Interval development of moderate size bilateral pleural effusions with associated partial compressive atelectasis of the lower lobes. 3. No CT evidence of pulmonary artery embolus. 4. Small ascites and anasarca. Electronically Signed   By: Elgie Collard M.D.   On: 03/26/2017 05:04   Ct Abdomen Pelvis W Contrast  Result Date: 03/04/2017 CLINICAL DATA:  Unintended weight loss. Nonlocalized abdominal pain. Recent nausea and vomiting. History of daily ETOH use and seizures. EXAM: CT CHEST, ABDOMEN, AND PELVIS WITH CONTRAST TECHNIQUE: Multidetector CT imaging of the chest, abdomen and pelvis was performed following the standard protocol during bolus administration of intravenous contrast. CONTRAST:  ISOVUE-300 IOPAMIDOL (ISOVUE-300) INJECTION 61% COMPARISON:  CT AP  08/06/2009 FINDINGS: CT CHEST FINDINGS Cardiovascular: Normal branch pattern of the great vessels. No aortic aneurysm or dissection. No large central pulmonary embolus. Normal sized heart without pericardial effusion. Minimal coronary arteriosclerosis along the circumflex. Mediastinum/Nodes: No enlarged mediastinal, hilar, or axillary lymph nodes. Thyroid gland, trachea, and esophagus demonstrate no significant findings. Lungs/Pleura: Minimal scarring and/or atelectasis at the apices. Faint nonspecific ground-glass opacities in both upper lobes and lingula with subpleural areas of atelectasis and/or scarring in the right middle lobe. No effusion or pneumothorax. Musculoskeletal: Bilateral gynecomastia. CT ABDOMEN PELVIS FINDINGS Hepatobiliary: Subtle surface nodularity of the liver that may reflect morphologic changes of cirrhosis. A pair of adjacent subcentimeter hypodensities in the right hepatic lobe are noted compatible with cysts or hemangiomata. A third similar subcentimeter hypodensity is seen further caudad anteriorly within the right hepatic lobe measuring up to 8 mm. No biliary dilatation. Normal gallbladder. Pancreas: No ductal dilatation, inflammation or mass. Spleen: Normal Adrenals/Urinary Tract: Normal bilateral adrenal glands. Symmetric enhancement of both kidneys without obstructive uropathy. Normal bladder. Stomach/Bowel: Small hiatal hernia. Physiologic distention of the stomach. Mild small bowel distention with fluid with slight fold thickening in the central abdomen compatible with an enteritis. No mechanical source of obstruction is seen. Decompressed descending colon through rectum. Liquid stool noted within the cecum and ascending colon. No evidence of appendicitis. Vascular/Lymphatic: Aortoiliac and branch vessel atherosclerosis without aneurysm or dissection. Reproductive: Prostate is unremarkable. Other: No abdominal wall hernia or abnormality. No abdominopelvic ascites. Musculoskeletal:  Degenerative disc disease L5-S1. No acute nor suspicious osseous abnormality. IMPRESSION: 1. Nonspecific faint ground-glass opacities are seen in both upper lobes left greater than right as well as lingula. Findings may represent small foci of alveolitis or pneumonitis are nonspecific. 2. Minimal coronary arteriosclerosis. 3. Mild fluid distention of small bowel without mechanical obstruction suspicious for mild small bowel enteritis. 4. Morphologic appearance of cirrhosis. 5. Three subcentimeter hypodensities in the right hepatic lobe are noted too small to further characterize but statistically consistent with cysts or hemangiomata. 6. Degenerative disc disease L5-S1. Electronically Signed   By: Tollie Eth M.D.   On: 03/04/2017 18:02   Dg Chest Port 1 View  Result Date: 03/27/2017 CLINICAL DATA:  Clinically suspected pneumonia. EXAM: PORTABLE CHEST 1 VIEW COMPARISON:  Portable chest x-ray of March 26, 2017 FINDINGS: The lungs are well-expanded. The interstitial markings remain increased diffusely. Patchy airspace opacity is noted especially on the right. The heart is normal in size. The pulmonary vascularity is indistinct. The mediastinum is normal in width. There is no pleural effusion. The bony thorax is unremarkable. IMPRESSION: Persistent  confluent interstitial and some airspace opacity bilaterally. Findings are consistent with pneumonia, edema, or ARDS in the appropriate clinical setting. Electronically Signed   By: David  [place] M.D.   On: 03/27/2017 14:00   Dg Chest Port 1 View  Result Date: 03/26/2017 CLINICAL DATA:  Pneumonia and sepsis EXAM: PORTABLE CHEST 1 VIEW COMPARISON:  03/25/2017, 03/23/2017, 03/17/2017, CT chest 03/04/2017, 03/26/2017 FINDINGS: Fairly extensive diffuse bilateral interstitial and ground-glass opacity. Small pleural effusions. Stable cardiomediastinal silhouette. No pneumothorax. IMPRESSION: Extensive interstitial and ground-glass opacity which may reflect edema, diffuse  pneumonia, or ARDS. Moderate pleural effusions on CT are not well demonstrated on portable view of the chest. Electronically Signed   By: Jasmine Pang M.D.   On: 03/26/2017 18:30   Dg Chest Portable 1 View  Result Date: 03/17/2017 CLINICAL DATA:  Chest pain starting at 1830 hours. EXAM: PORTABLE CHEST 1 VIEW COMPARISON:  03/04/2017 CXR FINDINGS: Portable AP semi upright view. Heart size is mildly enlarged but this may be due to the portable technique and slightly low lung volumes. No overt pulmonary edema, pneumonic consolidation or effusion. There is bibasilar as well as right upper lobe atelectasis. No acute nor suspicious osseous abnormalities. IMPRESSION: Bibasilar and right upper lobe atelectasis. Borderline cardiomegaly. Slightly low lung volumes. Electronically Signed   By: Tollie Eth M.D.   On: 03/17/2017 21:33     CBC Recent Labs  Lab 03/25/17 2024 03/27/17 0629 03/28/17 0553  WBC 11.6* 8.2 10.9*  HGB 10.7* 9.6* 9.9*  HCT 32.4* 28.4* 30.0*  PLT 281 246 282  MCV 99.6 99.0 101.0*  MCH 33.0 33.4 33.2  MCHC 33.1 33.8 32.9  RDW 14.6* 15.2* 15.3*  LYMPHSABS  --  0.2* 0.2*  MONOABS  --  0.4 0.4  EOSABS  --  0.0 0.0  BASOSABS  --  0.0 0.0    Chemistries  Recent Labs  Lab 03/24/17 0612 03/25/17 2024 03/26/17 0520 03/27/17 0629 03/28/17 0553 03/29/17 0630  NA 136 137  --  140 143 141  K 4.3 4.2  --  3.4* 4.2 4.2  CL 112* 107  --  108 111 105  CO2 18* 21*  --  22 23 26   GLUCOSE 136* 97  --  133* 123* 135*  BUN 15 16  --  22* 32* 41*  CREATININE 0.91 0.83  --  0.88 1.02 1.13  CALCIUM 8.4* 8.8*  --  8.6* 9.0 9.2  MG 2.0  --   --  2.2 2.5*  --   AST  --   --  108*  --   --   --   ALT  --   --  36  --   --   --   ALKPHOS  --   --  212*  --   --   --   BILITOT  --   --  1.4*  --   --   --    ------------------------------------------------------------------------------------------------------------------ estimated creatinine clearance is 61.9 mL/min (by C-G formula based  on SCr of 1.13 mg/dL). ------------------------------------------------------------------------------------------------------------------ No results for input(s): HGBA1C in the last 72 hours. ------------------------------------------------------------------------------------------------------------------ No results for input(s): CHOL, HDL, LDLCALC, TRIG, CHOLHDL, LDLDIRECT in the last 72 hours. ------------------------------------------------------------------------------------------------------------------ No results for input(s): TSH, T4TOTAL, T3FREE, THYROIDAB in the last 72 hours.  Invalid input(s): FREET3 ------------------------------------------------------------------------------------------------------------------ No results for input(s): VITAMINB12, FOLATE, FERRITIN, TIBC, IRON, RETICCTPCT in the last 72 hours.  Coagulation profile Recent Labs  Lab 03/26/17 0631  INR 1.18    No results for input(s):  DDIMER in the last 72 hours.  Cardiac Enzymes Recent Labs  Lab 03/25/17 2024 03/25/17 2221  TROPONINI 0.03* <0.03   ------------------------------------------------------------------------------------------------------------------ Invalid input(s): POCBNP    Assessment & Plan   This is a 62 year old male admitted for pneumonia.  1.    Acute hypoxic respiratory failure due to B/L pneumonia possible ARDS Continue Unasyn changed to Augmentin tomorrow 2. Acute respiratory failure  With ARDS from persistent bilateral HCAP /Aspiration pneumonia-  Continue IV antibiotics continue steroids slow to improve continue IV Lasix 3. Sepsis: present on admission, improving on Abx 4. Uncontrolled hypertension continue metoprolol labetalol as needed, blood pressure now stable 5.  Hx of seizure disorder continue Keppra 6. Hx Nicotine dependency smoking cessation provided         Code Status Orders  (From admission, onward)        Start     Ordered   03/26/17 0630  Full  code  Continuous     03/26/17 0629    Code Status History    Date Active Date Inactive Code Status Order ID Comments User Context   03/21/2017 1813 03/24/2017 2058 Full Code [id number]  Adrian Saran, MD Inpatient   03/13/2017 1004 03/14/2017 1346 Full Code [id number]  Enid Baas, MD Inpatient   03/12/2017 1606 03/13/2017 1004 DNR [id number]  Ihor Austin, MD Inpatient   03/04/2017 1923 03/04/2017 2355 DNR [id number]  Alford Highland, MD ED           Consults pulmonary critical care  DVT Prophylaxis  Lovenox   Lab Results  Component Value Date   PLT 282 03/28/2017     Time Spent in minutes 34 minutes   Greater than 50% of time spent in care coordination and counseling patient regarding the condition and plan of care.   Auburn Bilberry M.D on 03/30/2017 at 12:44 PM  Between 7am to 6pm - Pager - [phone]  After 6pm go to [email or web address] - Social research officer, government  [employer]   Office  [phone]

## 2017-03-31 ENCOUNTER — Inpatient Hospital Stay: Payer: Medicare Other

## 2017-03-31 MED ORDER — THIAMINE HCL 100 MG/ML IJ SOLN
100.0000 mg | Freq: Every day | INTRAMUSCULAR | Status: DC
  Administered 2017-03-31: 100 mg via INTRAVENOUS
  Filled 2017-03-31: qty 2

## 2017-03-31 MED ORDER — AMOXICILLIN-POT CLAVULANATE 875-125 MG PO TABS
1.0000 | ORAL_TABLET | Freq: Two times a day (BID) | ORAL | Status: DC
  Administered 2017-03-31 – 2017-04-01 (×3): 1 via ORAL
  Filled 2017-03-31 (×3): qty 1

## 2017-03-31 MED ORDER — LORAZEPAM 2 MG/ML IJ SOLN
1.0000 mg | Freq: Once | INTRAMUSCULAR | Status: AC
  Administered 2017-04-01: 1 mg via INTRAVENOUS
  Filled 2017-03-31: qty 1

## 2017-03-31 NOTE — Plan of Care (Signed)
Pt more agiatted, expressing anger.  Does not remember speaking with wife on the phone.  Not receptive to offers to help or therapeutic communication.  Bradly Chrisougherty, Trent Theisen E, RN

## 2017-03-31 NOTE — Progress Notes (Signed)
Patient has refused bipap 

## 2017-03-31 NOTE — Progress Notes (Signed)
[employer] - [place] at [facility]                                                                                                                                                                                  Patient Demographics   Marco [name], is a 62 y.o. male, DOB - 09-23-1955, ZOX:[id number]  Admit date - 03/25/2017   Admitting Physician Arnaldo Natal, MD  Outpatient Primary MD for the patient is Jerl Mina, MD   LOS - 5  Subjective: Pt confused not sure what day it is, where he is  His wife requesting him to be placed to a skilled nursing facility  Review of Systems:   Constitutional: Positive for malaise/fatigue and weight loss. Negative for chills and fever.  HENT: Negative.  Negative for ear discharge, ear pain, hearing loss, nosebleeds and sore throat.   Eyes: Negative.  Negative for blurred vision and pain.  Respiratory: Positive for cough and shortness of breath. Negative for hemoptysis and wheezing.   Cardiovascular: Negative.  Negative for chest pain, palpitations and leg swelling.  Gastrointestinal: Negative.  Negative for abdominal pain, blood in stool, diarrhea, nausea and vomiting.  Genitourinary: Negative.  Negative for dysuria.  Musculoskeletal: Negative.  Negative for back pain.  Skin: Negative.   Neurological: Negative for dizziness, tremors, speech change, focal weakness, seizures and headaches.  Endo/Heme/Allergies: Negative.  Does not bruise/bleed easily.  Psychiatric/Behavioral: Positive for memory loss. Negative for depression, hallucinations and suicidal ideas.   Vitals:   Vitals:   03/31/17 0500 03/31/17 0745 03/31/17 1131 03/31/17 1221  BP:    131/75  Pulse:    100  Resp:    14  Temp:    98.5 F (36.9 C)  TempSrc:    Oral  SpO2:  91% 94% 98%  Weight: 157 lb 13.6 oz (71.6 kg)     Height:        Wt Readings from Last 3 Encounters:  03/31/17 157 lb 13.6 oz (71.6 kg)  03/24/17 165 lb 6.4 oz (75 kg)  03/17/17 140 lb  (63.5 kg)     Intake/Output Summary (Last 24 hours) at 03/31/2017 1346 Last data filed at 03/31/2017 0601 Gross per 24 hour  Intake -  Output 2000 ml  Net -2000 ml    Physical Exam:   GENERAL: Improved mild respiratory distress HEAD, EYES, EARS, NOSE AND THROAT: Atraumatic, normocephalic. Extraocular muscles are intact. Pupils equal and reactive to light. Sclerae anicteric. No conjunctival injection. No oro-pharyngeal erythema.  NECK: Supple. There is no jugular venous distention. No bruits, no lymphadenopathy, no thyromegaly.  HEART: Regular rate and rhythm,. No murmurs, no rubs, no clicks.  LUNGS: Decreased  breath sounds bilaterally positive accessory muscle usage ABDOMEN: Soft, flat, nontender, nondistended. Has good bowel sounds. No hepatosplenomegaly appreciated.  EXTREMITIES: No evidence of any cyanosis, clubbing, or peripheral edema.  +2 pedal and radial pulses bilaterally.  NEUROLOGIC: Lethargic SKIN: Moist and warm with no rashes appreciated.  Psych: Not anxious, depressed LN: No inguinal LN enlargement    Antibiotics   Anti-infectives (From admission, onward)   Start     Dose/Rate Route Frequency Ordered Stop   03/29/17 1400  Ampicillin-Sulbactam (UNASYN) 3 g in sodium chloride 0.9 % 100 mL IVPB     3 g 200 mL/hr over 30 Minutes Intravenous Every 6 hours 03/29/17 1324     03/26/17 1000  ceFEPIme (MAXIPIME) 2 g in sodium chloride 0.9 % 100 mL IVPB  Status:  Discontinued     2 g 200 mL/hr over 30 Minutes Intravenous Every 8 hours 03/26/17 0237 03/29/17 1324   03/26/17 0900  vancomycin (VANCOCIN) IVPB 1000 mg/200 mL premix  Status:  Discontinued     1,000 mg 200 mL/hr over 60 Minutes Intravenous Every 12 hours 03/26/17 0410 03/26/17 1029   03/26/17 0115  ceFEPIme (MAXIPIME) 2 g in sodium chloride 0.9 % 100 mL IVPB     2 g 200 mL/hr over 30 Minutes Intravenous  Once 03/26/17 0100 03/26/17 0222   03/26/17 0115  vancomycin (VANCOCIN) IVPB 1000 mg/200 mL premix     1,000  mg 200 mL/hr over 60 Minutes Intravenous  Once 03/26/17 0100 03/26/17 0630   03/26/17 0000  Ampicillin-Sulbactam (UNASYN) 3 g in sodium chloride 0.9 % 100 mL IVPB  Status:  Discontinued     3 g 200 mL/hr over 30 Minutes Intravenous Every 6 hours 03/25/17 2325 03/26/17 0148      Medications   Scheduled Meds: . budesonide (PULMICORT) nebulizer solution  0.25 mg Nebulization BID  . docusate sodium  100 mg Oral BID  . enoxaparin (LOVENOX) injection  40 mg Subcutaneous Q24H  . feeding supplement (ENSURE ENLIVE)  237 mL Oral TID BM  . folic acid  1 mg Oral Daily  . furosemide  40 mg Intravenous Q12H  . ipratropium-albuterol  3 mL Nebulization Q4H  . levETIRAcetam  750 mg Oral BID  . methylPREDNISolone (SOLU-MEDROL) injection  40 mg Intravenous Q6H  . metoprolol tartrate  12.5 mg Oral BID  . multivitamin with minerals  1 tablet Oral Daily  . multivitamin-lutein  1 capsule Oral Daily  . nicotine  21 mg Transdermal Daily  . pantoprazole  40 mg Oral Daily  . tamsulosin  0.4 mg Oral Daily  . thiamine injection  100 mg Intravenous Daily  . thiamine  100 mg Oral Daily  . cyanocobalamin  2,000 mcg Oral Daily  . vitamin C  250 mg Oral BID   Continuous Infusions: . ampicillin-sulbactam (UNASYN) IV Stopped (03/31/17 1020)   PRN Meds:.acetaminophen **OR** acetaminophen, hydrALAZINE, labetalol, levalbuterol, metoprolol tartrate, ondansetron **OR** ondansetron (ZOFRAN) IV   Data Review:   Micro Results Recent Results (from the past 240 hour(s))  MRSA PCR Screening     Status: None   Collection Time: 03/21/17  6:21 PM  Result Value Ref Range Status   MRSA by PCR NEGATIVE NEGATIVE Final    Comment:        The GeneXpert MRSA Assay (FDA approved for NASAL specimens only), is one component of a comprehensive MRSA colonization surveillance program. It is not intended to diagnose MRSA infection nor to guide or monitor treatment for MRSA infections. Performed at  [facility], [address] [place], [place] [address]   MRSA PCR Screening     Status: None   Collection Time: 03/26/17  6:34 AM  Result Value Ref Range Status   MRSA by PCR NEGATIVE NEGATIVE Final    Comment:        The GeneXpert MRSA Assay (FDA approved for NASAL specimens only), is one component of a comprehensive MRSA colonization surveillance program. It is not intended to diagnose MRSA infection nor to guide or monitor treatment for MRSA infections. Performed at [facility], [address], [place], [place] [address]     Radiology Reports Dg Chest 1 View  Result Date: 03/23/2017 CLINICAL DATA:  Shortness of breath for the past 3 days.  Smoker. EXAM: CHEST  1 VIEW COMPARISON:  03/21/2017. FINDINGS: Interval borderline enlargement of the cardiac silhouette and extensive right lung airspace opacity. The interstitial markings on the left are prominent with faint airspace opacities with mild progression. No pleural fluid seen. Thoracolumbar spine degenerative changes. IMPRESSION: 1. Significant worsening of airspace opacity throughout the right lung, suspicious for pneumonia. 2. Mild increase in prominence of the interstitial markings and faint airspace opacities on the left, also suspicious for pneumonia. 3. Interval borderline cardiomegaly. This is accentuated by the portable AP technique and poor inspiration. Electronically Signed   By: Beckie Salts M.D.   On: 03/23/2017 14:10   Dg Chest 2 View  Result Date: 03/28/2017 CLINICAL DATA:  SOB EXAM: CHEST - 2 VIEW COMPARISON:  03/27/2017 FINDINGS: Bilateral diffuse interstitial and alveolar airspace opacities. No pleural effusion or pneumothorax. Normal cardiomediastinal silhouette. No acute osseous abnormality. IMPRESSION: 1. Bilateral diffuse interstitial and alveolar airspace opacities which may reflect pulmonary edema versus multilobar pneumonia. Electronically Signed   By: Elige Ko   On: 03/28/2017 10:34   Dg Chest 2 View  Result  Date: 03/25/2017 CLINICAL DATA:  Shortness of Breath EXAM: CHEST - 2 VIEW COMPARISON:  03/23/2017 FINDINGS: Small bilateral pleural effusions. Patchy bilateral airspace opacities are similar to prior study, compatible with pneumonia. Heart is normal size. IMPRESSION: Bilateral airspace opacities, right greater than left are similar prior study, compatible with multifocal pneumonia. Small bilateral effusions. Electronically Signed   By: Charlett Nose M.D.   On: 03/25/2017 20:56   Dg Chest 2 View  Result Date: 03/21/2017 CLINICAL DATA:  Not feeling well.  Shortness of breath. EXAM: CHEST - 2 VIEW COMPARISON:  03/17/2017. FINDINGS: BILATERAL airspace opacities have worsened RIGHT greater than LEFT since the prior radiograph consistent with pneumonia. No effusion or pneumothorax. No significant lobar atelectasis. Normal heart size. No bony abnormality. Suspected COPD. IMPRESSION: Worsening aeration suggesting BILATERAL pneumonia, worse on the RIGHT. Electronically Signed   By: Elsie Stain M.D.   On: 03/21/2017 14:06   Dg Chest 2 View  Result Date: 03/04/2017 CLINICAL DATA:  Cough EXAM: CHEST  2 VIEW COMPARISON:  11/10/2016 FINDINGS: Normal heart size and mediastinal contours. Borderline hyperinflation. No acute infiltrate or edema. No effusion or pneumothorax. No acute osseous findings. IMPRESSION: Negative chest. Electronically Signed   By: Marnee Spring M.D.   On: 03/04/2017 15:20   Ct Head Wo Contrast  Result Date: 03/31/2017 CLINICAL DATA:  Altered level of consciousness. EXAM: CT HEAD WITHOUT CONTRAST TECHNIQUE: Contiguous axial images were obtained from the base of the skull through the vertex without intravenous contrast. COMPARISON:  03/17/2017 FINDINGS: Brain: No evidence of acute infarction, hemorrhage, hydrocephalus, extra-axial collection or mass lesion/mass effect. Mild cerebral volume loss. Vascular: No  hyperdense vessel or unexpected calcification. Skull: Normal. Negative for fracture or  focal lesion. Sinuses/Orbits: Partial left mastoid opacification, chronic. Negative nasopharynx. Probable retention cysts in the medial right maxillary sinus, stable from 2013. IMPRESSION: No acute finding or change from prior. Electronically Signed   By: Marnee Spring M.D.   On: 03/31/2017 11:38   Ct Head Wo Contrast  Result Date: 03/17/2017 CLINICAL DATA:  Chest pain starting at 1830 hours with dyspnea. Altered level of consciousness. Patient could not recognize family members. EXAM: CT HEAD WITHOUT CONTRAST TECHNIQUE: Contiguous axial images were obtained from the base of the skull through the vertex without intravenous contrast. COMPARISON:  03/04/2017 FINDINGS: Brain: Stable superficial and central atrophy with chronic appearing small vessel ischemic disease. No acute intracranial hemorrhage midline shift or edema. No intra-axial mass nor extra-axial fluid collections. Midline fourth ventricle and basal cisterns. Vascular: No hyperdense vessel sign. Skull: No acute skull fracture or suspicious osseous lesions. Sinuses/Orbits: Stable findings of polypoid mucosal thickening involving the medial right maxillary sinus. Partial opacification of left mastoid air cells also chronic. Other: None IMPRESSION: 1. Stable involutional changes of the brain with small vessel ischemic disease. No acute intracranial abnormality. 2. Stable polypoid opacification medial aspect right maxillary sinus potentially represent small polyp or mucous retention cyst. Chronic left mastoid effusion. Electronically Signed   By: Tollie Eth M.D.   On: 03/17/2017 21:38   Ct Head Wo Contrast  Result Date: 03/04/2017 CLINICAL DATA:  62 year old male with daily alcohol intake. Seizures, on seizure medication. Somnolence and confusion. Initial encounter. EXAM: CT HEAD WITHOUT CONTRAST TECHNIQUE: Contiguous axial images were obtained from the base of the skull through the vertex without intravenous contrast. COMPARISON:  11/11/2013 head CT.  FINDINGS: Brain: No intracranial hemorrhage or CT evidence of large acute infarct. Moderate global atrophy. No intracranial mass lesion noted on this unenhanced exam. Vascular: Minimal vascular calcifications Skull: No acute abnormality. Sinuses/Orbits: No acute orbital abnormality. Mild polypoid opacification medial aspect right maxillary sinus. Remainder of visualized paranasal sinuses are clear. Other: Partial opacification left mastoid air cells without obstructing lesion of the eustachian tube noted. IMPRESSION: No acute intracranial abnormality noted. Moderate global atrophy. Polypoid opacification medial aspect right maxillary sinus and partial opacification left mastoid air cells. Electronically Signed   By: Lacy Duverney M.D.   On: 03/04/2017 17:57   Ct Chest W Contrast  Result Date: 03/04/2017 CLINICAL DATA:  Unintended weight loss. Nonlocalized abdominal pain. Recent nausea and vomiting. History of daily ETOH use and seizures. EXAM: CT CHEST, ABDOMEN, AND PELVIS WITH CONTRAST TECHNIQUE: Multidetector CT imaging of the chest, abdomen and pelvis was performed following the standard protocol during bolus administration of intravenous contrast. CONTRAST:  ISOVUE-300 IOPAMIDOL (ISOVUE-300) INJECTION 61% COMPARISON:  CT AP 08/06/2009 FINDINGS: CT CHEST FINDINGS Cardiovascular: Normal branch pattern of the great vessels. No aortic aneurysm or dissection. No large central pulmonary embolus. Normal sized heart without pericardial effusion. Minimal coronary arteriosclerosis along the circumflex. Mediastinum/Nodes: No enlarged mediastinal, hilar, or axillary lymph nodes. Thyroid gland, trachea, and esophagus demonstrate no significant findings. Lungs/Pleura: Minimal scarring and/or atelectasis at the apices. Faint nonspecific ground-glass opacities in both upper lobes and lingula with subpleural areas of atelectasis and/or scarring in the right middle lobe. No effusion or pneumothorax. Musculoskeletal:  Bilateral gynecomastia. CT ABDOMEN PELVIS FINDINGS Hepatobiliary: Subtle surface nodularity of the liver that may reflect morphologic changes of cirrhosis. A pair of adjacent subcentimeter hypodensities in the right hepatic lobe are noted compatible with cysts or hemangiomata. A third  similar subcentimeter hypodensity is seen further caudad anteriorly within the right hepatic lobe measuring up to 8 mm. No biliary dilatation. Normal gallbladder. Pancreas: No ductal dilatation, inflammation or mass. Spleen: Normal Adrenals/Urinary Tract: Normal bilateral adrenal glands. Symmetric enhancement of both kidneys without obstructive uropathy. Normal bladder. Stomach/Bowel: Small hiatal hernia. Physiologic distention of the stomach. Mild small bowel distention with fluid with slight fold thickening in the central abdomen compatible with an enteritis. No mechanical source of obstruction is seen. Decompressed descending colon through rectum. Liquid stool noted within the cecum and ascending colon. No evidence of appendicitis. Vascular/Lymphatic: Aortoiliac and branch vessel atherosclerosis without aneurysm or dissection. Reproductive: Prostate is unremarkable. Other: No abdominal wall hernia or abnormality. No abdominopelvic ascites. Musculoskeletal: Degenerative disc disease L5-S1. No acute nor suspicious osseous abnormality. IMPRESSION: 1. Nonspecific faint ground-glass opacities are seen in both upper lobes left greater than right as well as lingula. Findings may represent small foci of alveolitis or pneumonitis are nonspecific. 2. Minimal coronary arteriosclerosis. 3. Mild fluid distention of small bowel without mechanical obstruction suspicious for mild small bowel enteritis. 4. Morphologic appearance of cirrhosis. 5. Three subcentimeter hypodensities in the right hepatic lobe are noted too small to further characterize but statistically consistent with cysts or hemangiomata. 6. Degenerative disc disease L5-S1.  Electronically Signed   By: Tollie Eth M.D.   On: 03/04/2017 18:02   Ct Angio Chest Pe W/cm &/or Wo Cm  Result Date: 03/26/2017 CLINICAL DATA:  62 year old male with recent discharge from the hospital for pneumonia and sepsis. Worsening of shortness of breath and confusion. EXAM: CT ANGIOGRAPHY CHEST CT ABDOMEN AND PELVIS WITH CONTRAST TECHNIQUE: Multidetector CT imaging of the chest was performed using the standard protocol during bolus administration of intravenous contrast. Multiplanar CT image reconstructions and MIPs were obtained to evaluate the vascular anatomy. Multidetector CT imaging of the abdomen and pelvis was performed using the standard protocol during bolus administration of intravenous contrast. CONTRAST:  ISOVUE-370 IOPAMIDOL (ISOVUE-370) INJECTION 76% COMPARISON:  Chest CT dated 03/04/2017 FINDINGS: CTA CHEST FINDINGS Cardiovascular: There is no cardiomegaly or pericardial effusion. The thoracic aorta is unremarkable. The origins of the great vessels of the aortic arch appear patent. There is no CT evidence of pulmonary embolism. Mediastinum/Nodes: Bilateral hilar and mediastinal adenopathy, reactive. Right hilar lymph node measures 13 mm in short axis. The esophagus is grossly unremarkable. No mediastinal fluid collection. Lungs/Pleura: There are extensive bilateral patchy ground-glass opacity and interlobular septal thickening with upper lobe predominant and new compared to the prior CT. Differentials include ARDS, bacterial pneumonia, acute interstitial pneumonia, or pulmonary alveolar proteinosis. Other etiologies are not excluded. Clinical correlation and pulmonary consult is advised. There is moderate bilateral pleural effusions, new since the prior CT with associated partial compressive atelectasis of the lower lobes. No pneumothorax. The central airways are patent. Musculoskeletal: There is diffuse subcutaneous edema and anasarca. The osseous structures are intact. Review of the  MIP images confirms the above findings. CT ABDOMEN and PELVIS FINDINGS No intra-abdominal free air.  Small ascites. Hepatobiliary: Indeterminate 15 mm hypodense lesion in the right lobe of the liver similar to prior CT. No intrahepatic biliary ductal dilatation. The gallbladder is unremarkable. Pancreas: Unremarkable. No pancreatic ductal dilatation or surrounding inflammatory changes. Spleen: Normal in size without focal abnormality. Adrenals/Urinary Tract: The adrenal glands, kidneys, and the visualized ureters appear unremarkable. The urinary bladder is decompressed around a Foley catheter. Air within the bladder introduced via catheter. Stomach/Bowel: There is no bowel obstruction or active inflammation. Normal appendix.  Vascular/Lymphatic: Mild aortoiliac atherosclerotic disease. No portal venous gas. There is no adenopathy. Reproductive: The prostate and seminal vesicles are grossly unremarkable. Other: Diffuse subcutaneous edema and anasarca. Musculoskeletal: Degenerative changes of the spine. No acute osseous pathology. Review of the MIP images confirms the above findings. IMPRESSION: 1. Diffuse bilateral airspace density and interlobular septal prominence with a "crazy paving" appearance and new since the prior CT. Differentials include ARDS, bacterial pneumonia, acute interstitial pneumonitis, or pulmonary alveolar proteinosis. Clinical correlation and pulmonary consult is advised. 2. Interval development of moderate size bilateral pleural effusions with associated partial compressive atelectasis of the lower lobes. 3. No CT evidence of pulmonary artery embolus. 4. Small ascites and anasarca. Electronically Signed   By: Elgie Collard M.D.   On: 03/26/2017 05:04   Ct Abdomen Pelvis W Contrast  Result Date: 03/26/2017 CLINICAL DATA:  62 year old male with recent discharge from the hospital for pneumonia and sepsis. Worsening of shortness of breath and confusion. EXAM: CT ANGIOGRAPHY CHEST CT ABDOMEN  AND PELVIS WITH CONTRAST TECHNIQUE: Multidetector CT imaging of the chest was performed using the standard protocol during bolus administration of intravenous contrast. Multiplanar CT image reconstructions and MIPs were obtained to evaluate the vascular anatomy. Multidetector CT imaging of the abdomen and pelvis was performed using the standard protocol during bolus administration of intravenous contrast. CONTRAST:  ISOVUE-370 IOPAMIDOL (ISOVUE-370) INJECTION 76% COMPARISON:  Chest CT dated 03/04/2017 FINDINGS: CTA CHEST FINDINGS Cardiovascular: There is no cardiomegaly or pericardial effusion. The thoracic aorta is unremarkable. The origins of the great vessels of the aortic arch appear patent. There is no CT evidence of pulmonary embolism. Mediastinum/Nodes: Bilateral hilar and mediastinal adenopathy, reactive. Right hilar lymph node measures 13 mm in short axis. The esophagus is grossly unremarkable. No mediastinal fluid collection. Lungs/Pleura: There are extensive bilateral patchy ground-glass opacity and interlobular septal thickening with upper lobe predominant and new compared to the prior CT. Differentials include ARDS, bacterial pneumonia, acute interstitial pneumonia, or pulmonary alveolar proteinosis. Other etiologies are not excluded. Clinical correlation and pulmonary consult is advised. There is moderate bilateral pleural effusions, new since the prior CT with associated partial compressive atelectasis of the lower lobes. No pneumothorax. The central airways are patent. Musculoskeletal: There is diffuse subcutaneous edema and anasarca. The osseous structures are intact. Review of the MIP images confirms the above findings. CT ABDOMEN and PELVIS FINDINGS No intra-abdominal free air.  Small ascites. Hepatobiliary: Indeterminate 15 mm hypodense lesion in the right lobe of the liver similar to prior CT. No intrahepatic biliary ductal dilatation. The gallbladder is unremarkable. Pancreas:  Unremarkable. No pancreatic ductal dilatation or surrounding inflammatory changes. Spleen: Normal in size without focal abnormality. Adrenals/Urinary Tract: The adrenal glands, kidneys, and the visualized ureters appear unremarkable. The urinary bladder is decompressed around a Foley catheter. Air within the bladder introduced via catheter. Stomach/Bowel: There is no bowel obstruction or active inflammation. Normal appendix. Vascular/Lymphatic: Mild aortoiliac atherosclerotic disease. No portal venous gas. There is no adenopathy. Reproductive: The prostate and seminal vesicles are grossly unremarkable. Other: Diffuse subcutaneous edema and anasarca. Musculoskeletal: Degenerative changes of the spine. No acute osseous pathology. Review of the MIP images confirms the above findings. IMPRESSION: 1. Diffuse bilateral airspace density and interlobular septal prominence with a "crazy paving" appearance and new since the prior CT. Differentials include ARDS, bacterial pneumonia, acute interstitial pneumonitis, or pulmonary alveolar proteinosis. Clinical correlation and pulmonary consult is advised. 2. Interval development of moderate size bilateral pleural effusions with associated partial compressive atelectasis of the lower  lobes. 3. No CT evidence of pulmonary artery embolus. 4. Small ascites and anasarca. Electronically Signed   By: Elgie Collard M.D.   On: 03/26/2017 05:04   Ct Abdomen Pelvis W Contrast  Result Date: 03/04/2017 CLINICAL DATA:  Unintended weight loss. Nonlocalized abdominal pain. Recent nausea and vomiting. History of daily ETOH use and seizures. EXAM: CT CHEST, ABDOMEN, AND PELVIS WITH CONTRAST TECHNIQUE: Multidetector CT imaging of the chest, abdomen and pelvis was performed following the standard protocol during bolus administration of intravenous contrast. CONTRAST:  ISOVUE-300 IOPAMIDOL (ISOVUE-300) INJECTION 61% COMPARISON:  CT AP 08/06/2009 FINDINGS: CT CHEST FINDINGS Cardiovascular:  Normal branch pattern of the great vessels. No aortic aneurysm or dissection. No large central pulmonary embolus. Normal sized heart without pericardial effusion. Minimal coronary arteriosclerosis along the circumflex. Mediastinum/Nodes: No enlarged mediastinal, hilar, or axillary lymph nodes. Thyroid gland, trachea, and esophagus demonstrate no significant findings. Lungs/Pleura: Minimal scarring and/or atelectasis at the apices. Faint nonspecific ground-glass opacities in both upper lobes and lingula with subpleural areas of atelectasis and/or scarring in the right middle lobe. No effusion or pneumothorax. Musculoskeletal: Bilateral gynecomastia. CT ABDOMEN PELVIS FINDINGS Hepatobiliary: Subtle surface nodularity of the liver that may reflect morphologic changes of cirrhosis. A pair of adjacent subcentimeter hypodensities in the right hepatic lobe are noted compatible with cysts or hemangiomata. A third similar subcentimeter hypodensity is seen further caudad anteriorly within the right hepatic lobe measuring up to 8 mm. No biliary dilatation. Normal gallbladder. Pancreas: No ductal dilatation, inflammation or mass. Spleen: Normal Adrenals/Urinary Tract: Normal bilateral adrenal glands. Symmetric enhancement of both kidneys without obstructive uropathy. Normal bladder. Stomach/Bowel: Small hiatal hernia. Physiologic distention of the stomach. Mild small bowel distention with fluid with slight fold thickening in the central abdomen compatible with an enteritis. No mechanical source of obstruction is seen. Decompressed descending colon through rectum. Liquid stool noted within the cecum and ascending colon. No evidence of appendicitis. Vascular/Lymphatic: Aortoiliac and branch vessel atherosclerosis without aneurysm or dissection. Reproductive: Prostate is unremarkable. Other: No abdominal wall hernia or abnormality. No abdominopelvic ascites. Musculoskeletal: Degenerative disc disease L5-S1. No acute nor suspicious  osseous abnormality. IMPRESSION: 1. Nonspecific faint ground-glass opacities are seen in both upper lobes left greater than right as well as lingula. Findings may represent small foci of alveolitis or pneumonitis are nonspecific. 2. Minimal coronary arteriosclerosis. 3. Mild fluid distention of small bowel without mechanical obstruction suspicious for mild small bowel enteritis. 4. Morphologic appearance of cirrhosis. 5. Three subcentimeter hypodensities in the right hepatic lobe are noted too small to further characterize but statistically consistent with cysts or hemangiomata. 6. Degenerative disc disease L5-S1. Electronically Signed   By: Tollie Eth M.D.   On: 03/04/2017 18:02   Dg Chest Port 1 View  Result Date: 03/27/2017 CLINICAL DATA:  Clinically suspected pneumonia. EXAM: PORTABLE CHEST 1 VIEW COMPARISON:  Portable chest x-ray of March 26, 2017 FINDINGS: The lungs are well-expanded. The interstitial markings remain increased diffusely. Patchy airspace opacity is noted especially on the right. The heart is normal in size. The pulmonary vascularity is indistinct. The mediastinum is normal in width. There is no pleural effusion. The bony thorax is unremarkable. IMPRESSION: Persistent confluent interstitial and some airspace opacity bilaterally. Findings are consistent with pneumonia, edema, or ARDS in the appropriate clinical setting. Electronically Signed   By: David  [place] M.D.   On: 03/27/2017 14:00   Dg Chest Port 1 View  Result Date: 03/26/2017 CLINICAL DATA:  Pneumonia and sepsis EXAM: PORTABLE CHEST 1 VIEW  COMPARISON:  03/25/2017, 03/23/2017, 03/17/2017, CT chest 03/04/2017, 03/26/2017 FINDINGS: Fairly extensive diffuse bilateral interstitial and ground-glass opacity. Small pleural effusions. Stable cardiomediastinal silhouette. No pneumothorax. IMPRESSION: Extensive interstitial and ground-glass opacity which may reflect edema, diffuse pneumonia, or ARDS. Moderate pleural effusions on CT are  not well demonstrated on portable view of the chest. Electronically Signed   By: Jasmine Pang M.D.   On: 03/26/2017 18:30   Dg Chest Portable 1 View  Result Date: 03/17/2017 CLINICAL DATA:  Chest pain starting at 1830 hours. EXAM: PORTABLE CHEST 1 VIEW COMPARISON:  03/04/2017 CXR FINDINGS: Portable AP semi upright view. Heart size is mildly enlarged but this may be due to the portable technique and slightly low lung volumes. No overt pulmonary edema, pneumonic consolidation or effusion. There is bibasilar as well as right upper lobe atelectasis. No acute nor suspicious osseous abnormalities. IMPRESSION: Bibasilar and right upper lobe atelectasis. Borderline cardiomegaly. Slightly low lung volumes. Electronically Signed   By: Tollie Eth M.D.   On: 03/17/2017 21:33     CBC Recent Labs  Lab 03/25/17 2024 03/27/17 0629 03/28/17 0553  WBC 11.6* 8.2 10.9*  HGB 10.7* 9.6* 9.9*  HCT 32.4* 28.4* 30.0*  PLT 281 246 282  MCV 99.6 99.0 101.0*  MCH 33.0 33.4 33.2  MCHC 33.1 33.8 32.9  RDW 14.6* 15.2* 15.3*  LYMPHSABS  --  0.2* 0.2*  MONOABS  --  0.4 0.4  EOSABS  --  0.0 0.0  BASOSABS  --  0.0 0.0    Chemistries  Recent Labs  Lab 03/25/17 2024 03/26/17 0520 03/27/17 0629 03/28/17 0553 03/29/17 0630  NA 137  --  140 143 141  K 4.2  --  3.4* 4.2 4.2  CL 107  --  108 111 105  CO2 21*  --  22 23 26   GLUCOSE 97  --  133* 123* 135*  BUN 16  --  22* 32* 41*  CREATININE 0.83  --  0.88 1.02 1.13  CALCIUM 8.8*  --  8.6* 9.0 9.2  MG  --   --  2.2 2.5*  --   AST  --  108*  --   --   --   ALT  --  36  --   --   --   ALKPHOS  --  212*  --   --   --   BILITOT  --  1.4*  --   --   --    ------------------------------------------------------------------------------------------------------------------ estimated creatinine clearance is 61.9 mL/min (by C-G formula based on SCr of 1.13  mg/dL). ------------------------------------------------------------------------------------------------------------------ No results for input(s): HGBA1C in the last 72 hours. ------------------------------------------------------------------------------------------------------------------ No results for input(s): CHOL, HDL, LDLCALC, TRIG, CHOLHDL, LDLDIRECT in the last 72 hours. ------------------------------------------------------------------------------------------------------------------ No results for input(s): TSH, T4TOTAL, T3FREE, THYROIDAB in the last 72 hours.  Invalid input(s): FREET3 ------------------------------------------------------------------------------------------------------------------ Recent Labs    03/29/17 0630  VITAMINB12 728    Coagulation profile Recent Labs  Lab 03/26/17 0631  INR 1.18    No results for input(s): DDIMER in the last 72 hours.  Cardiac Enzymes Recent Labs  Lab 03/25/17 2024 03/25/17 2221  TROPONINI 0.03* <0.03   ------------------------------------------------------------------------------------------------------------------ Invalid input(s): POCBNP    Assessment & Plan   This is a 62 year old male admitted for pneumonia.  1.    Acute hypoxic respiratory failure due to B/L pneumonia possible ARDS Changed to Augmentin today 2. Acute respiratory failure  With ARDS from persistent bilateral HCAP /Aspiration pneumonia-  Oral antibiotics continue steroids slow to  improve continue IV Lasix 3. Sepsis: present on admission, improving on Abx 4. Uncontrolled hypertension continue metoprolol labetalol as needed, blood pressure now stable 5.  Hx of seizure disorder continue Keppra 6. Hx Nicotine dependency smoking cessation provided         Code Status Orders  (From admission, onward)        Start     Ordered   03/26/17 0630  Full code  Continuous     03/26/17 0629    Code Status History    Date Active Date Inactive  Code Status Order ID Comments User Context   03/21/2017 1813 03/24/2017 2058 Full Code [id number]  Adrian Saran, MD Inpatient   03/13/2017 1004 03/14/2017 1346 Full Code [id number]  Enid Baas, MD Inpatient   03/12/2017 1606 03/13/2017 1004 DNR [id number]  Ihor Austin, MD Inpatient   03/04/2017 1923 03/04/2017 2355 DNR [id number]  Alford Highland, MD ED           Consults pulmonary critical care  DVT Prophylaxis  Lovenox   Lab Results  Component Value Date   PLT 282 03/28/2017     Time Spent in minutes 34 minutes   Greater than 50% of time spent in care coordination and counseling patient regarding the condition and plan of care.   Auburn Bilberry M.D on 03/31/2017 at 1:46 PM  Between 7am to 6pm - Pager - [phone]  After 6pm go to [email or web address] - Social research officer, government  [employer]   Office  [phone]

## 2017-03-31 NOTE — Clinical Social Work Note (Signed)
CSW received consult for SNF placement. The CSW has already discussed the PT recommendation with the patient, and the patient is declining SNF. CSW is signing off. Please consult should needs arise.  Argentina PonderKaren Martha Khamauri Bauernfeind, MSW, Theresia MajorsLCSWA [phone]

## 2017-03-31 NOTE — Progress Notes (Signed)
03/31/2017 5:56 PM  Pt angry, agitated, cursing frequently.  Seems to have a problem with his phone.  Tested his phone to ensure that it worked.  Claimed "I have been trying to eat for hours."  Offered to set up food tray and help patient eat but he refused.  Pt yelled for me to leave the room.  I obliged and will round on pt again before change of shift.  Bradly Chrisougherty, Marybelle Giraldo E, RN

## 2017-04-01 LAB — BASIC METABOLIC PANEL
Anion gap: 8 (ref 5–15)
BUN: 35 mg/dL — AB (ref 6–20)
CHLORIDE: 101 mmol/L (ref 101–111)
CO2: 36 mmol/L — ABNORMAL HIGH (ref 22–32)
Calcium: 9.1 mg/dL (ref 8.9–10.3)
Creatinine, Ser: 0.71 mg/dL (ref 0.61–1.24)
GFR calc Af Amer: >60 mL/min (ref >60)
GFR calc non Af Amer: >60 mL/min (ref >60)
GLUCOSE: 110 mg/dL — AB (ref 65–99)
POTASSIUM: 4 mmol/L (ref 3.5–5.1)
Sodium: 145 mmol/L (ref 135–145)

## 2017-04-01 LAB — MAGNESIUM: Magnesium: 2.1 mg/dL (ref 1.7–2.4)

## 2017-04-01 MED ORDER — METOPROLOL TARTRATE 25 MG PO TABS: 12.5000 mg | ORAL_TABLET | Freq: Two times a day (BID) | ORAL | 0 refills | Status: DC

## 2017-04-01 MED ORDER — AMOXICILLIN-POT CLAVULANATE 875-125 MG PO TABS: 1.0000 | ORAL_TABLET | Freq: Two times a day (BID) | ORAL | 0 refills | Status: DC

## 2017-04-01 MED ORDER — PREDNISONE 20 MG PO TABS: ORAL_TABLET | ORAL | 0 refills | Status: DC

## 2017-04-01 MED ORDER — PREDNISONE 20 MG PO TABS: 40.0000 mg | ORAL_TABLET | Freq: Every day | ORAL | Status: DC

## 2017-04-01 MED ORDER — ADULT MULTIVITAMIN W/MINERALS CH: 1.0000 | ORAL_TABLET | Freq: Every day | ORAL | Status: DC

## 2017-04-01 MED ORDER — PREDNISONE 10 MG PO TABS: ORAL_TABLET | ORAL | Status: DC

## 2017-04-01 MED ORDER — THIAMINE HCL 100 MG/ML IJ SOLN: 100.0000 mg | Freq: Every day | INTRAMUSCULAR | Status: DC

## 2017-04-01 MED ORDER — THIAMINE HCL 100 MG PO TABS: 100.0000 mg | ORAL_TABLET | Freq: Every day | ORAL | Status: DC

## 2017-04-01 NOTE — Clinical Social Work Placement (Signed)
   CLINICAL SOCIAL WORK PLACEMENT  NOTE  Date:  04/01/2017  Patient Details  Name: Marco [name] MRN: [id number] Date of Birth: 1955/08/24  Clinical Social Work is seeking post-discharge placement for this patient at the Skilled  Nursing Facility level of care (*CSW will initial, date and re-position this form in  chart as items are completed):  Yes   Patient/family provided with [place] Clinical Social Work Department's list of facilities offering this level of care within the geographic area requested by the patient (or if unable, by the patient's family).  Yes   Patient/family informed of their freedom to choose among providers that offer the needed level of care, that participate in Medicare, Medicaid or managed care program needed by the patient, have an available bed and are willing to accept the patient.  Yes   Patient/family informed of [facility] ownership interest [facility] and [facility], as well as of the fact that they are under no obligation to receive care at these facilities.  PASRR submitted to EDS on 04/01/17     PASRR number received on 04/01/17     Existing PASRR number confirmed on       FL2 transmitted to all facilities in geographic area requested by pt/family on 04/01/17     FL2 transmitted to all facilities within larger geographic area on       Patient informed that his/her managed care company has contracts with or will negotiate with certain facilities, including the following:        Yes   Patient/family informed of bed offers received.  Patient chooses bed at [facility])     Physician recommends and patient chooses bed at [facility](SNF)    Patient to be transferred to [facility] on 04/01/17.  Patient to be transferred to facility by (EMS)     Patient family notified on 04/01/17 of transfer.  Name of family member notified:  wife: cora     PHYSICIAN       Additional Comment:     _______________________________________________ York SpanielMonica Tashe Purdon, LCSW 04/01/2017, 12:34 PM

## 2017-04-01 NOTE — NC FL2 (Signed)
[place] MEDICAID FL2 LEVEL OF CARE SCREENING TOOL     IDENTIFICATION  Patient Name: Marco [name] Birthdate: [birth year] Sex: male Admission Date (Current Location): 03/25/2017  [place] and [place] Number:  Chiropodist and Address:  [facility], [address], [place], [place] [address]      Provider Number: [id number]  Attending Physician Name and Address:  Shaune Pollack, MD  Relative Name and Phone Number:       Current Level of Care: Hospital Recommended Level of Care: Skilled Nursing Facility Prior Approval Number:    Date Approved/Denied:   PASRR Number:    Discharge Plan: SNF    Current Diagnoses: Patient Active Problem List   Diagnosis Date Noted  . HCAP (healthcare-associated pneumonia) 03/26/2017  . Malnutrition of moderate degree 03/22/2017  . Sepsis (HCC) 03/21/2017  . Hyponatremia 03/12/2017  . Acute encephalopathy 03/04/2017    Orientation RESPIRATION BLADDER Height & Weight     Self, Time, Situation, Place  Normal, O2(2 liters) Continent Weight: 151 lb 10.8 oz (68.8 kg) Height:  5\' 6"  (167.6 cm)  BEHAVIORAL SYMPTOMS/MOOD NEUROLOGICAL BOWEL NUTRITION STATUS  (none) (none) Continent Diet(dysphagia 3)  AMBULATORY STATUS COMMUNICATION OF NEEDS Skin   Extensive Assist Verbally Normal                       Personal Care Assistance Level of Assistance  Bathing, Dressing, Feeding Bathing Assistance: Maximum assistance Feeding assistance: Maximum assistance Dressing Assistance: Maximum assistance     Functional Limitations Info  Sight Sight Info: Impaired        SPECIAL CARE FACTORS FREQUENCY                       Contractures Contractures Info: Not present    Additional Factors Info                  Current Medications (04/01/2017):  This is the current hospital active medication list Current Facility-Administered Medications  Medication Dose Route Frequency Provider Last Rate Last Dose   . acetaminophen (TYLENOL) tablet 650 mg  650 mg Oral Q6H PRN Arnaldo Natal, MD   650 mg at 03/31/17 2142   Or  . acetaminophen (TYLENOL) suppository 650 mg  650 mg Rectal Q6H PRN Arnaldo Natal, MD      . amoxicillin-clavulanate (AUGMENTIN) 875-125 MG per tablet 1 tablet  1 tablet Oral Q12H Auburn Bilberry, MD   1 tablet at 03/31/17 2132  . budesonide (PULMICORT) nebulizer solution 0.25 mg  0.25 mg Nebulization BID Auburn Bilberry, MD   0.25 mg at 04/01/17 0830  . docusate sodium (COLACE) capsule 100 mg  100 mg Oral BID Arnaldo Natal, MD   100 mg at 03/31/17 2132  . enoxaparin (LOVENOX) injection 40 mg  40 mg Subcutaneous Q24H Arnaldo Natal, MD   40 mg at 03/31/17 2132  . feeding supplement (ENSURE ENLIVE) (ENSURE ENLIVE) liquid 237 mL  237 mL Oral TID BM Auburn Bilberry, MD   237 mL at 03/31/17 2013  . folic acid (FOLVITE) tablet 1 mg  1 mg Oral Daily Annett Fabian, MD   1 mg at 03/31/17 0915  . furosemide (LASIX) injection 40 mg  40 mg Intravenous Q12H Auburn Bilberry, MD   40 mg at 04/01/17 0420  . hydrALAZINE (APRESOLINE) injection 10 mg  10 mg Intravenous Q4H PRN Karl Ito, MD   10 mg at 03/26/17 1521  . ipratropium-albuterol (  DUONEB) 0.5-2.5 (3) MG/3ML nebulizer solution 3 mL  3 mL Nebulization Q4H Annett Fabianichards, Karol A, MD   3 mL at 04/01/17 0830  . labetalol (NORMODYNE,TRANDATE) injection 5-10 mg  5-10 mg Intravenous Q2H PRN Arnaldo Nataliamond, Avyon S, MD   10 mg at 03/26/17 1108  . levalbuterol (XOPENEX) nebulizer solution 0.63 mg  0.63 mg Nebulization Q6H PRN Cammy CopaMaier, Angela, MD      . levETIRAcetam (KEPPRA) tablet 750 mg  750 mg Oral BID Cammy CopaMaier, Angela, MD   750 mg at 03/31/17 2132  . methylPREDNISolone sodium succinate (SOLU-MEDROL) 40 mg/mL injection 40 mg  40 mg Intravenous Q6H Annett Fabianichards, Karol A, MD   40 mg at 04/01/17 0420  . metoprolol tartrate (LOPRESSOR) injection 5 mg  5 mg Intravenous Q6H PRN Cammy CopaMaier, Angela, MD   5 mg at 03/29/17 1702  . metoprolol tartrate  (LOPRESSOR) tablet 12.5 mg  12.5 mg Oral BID Annett Fabianichards, Karol A, MD   12.5 mg at 03/31/17 2132  . multivitamin with minerals tablet 1 tablet  1 tablet Oral Daily Auburn BilberryPatel, Shreyang, MD   1 tablet at 03/31/17 0915  . multivitamin-lutein (OCUVITE-LUTEIN) capsule 1 capsule  1 capsule Oral Daily Annett Fabianichards, Karol A, MD   1 capsule at 03/31/17 0915  . nicotine (NICODERM CQ - dosed in mg/24 hours) patch 21 mg  21 mg Transdermal Daily Arnaldo Nataliamond, Dvontae S, MD   21 mg at 03/31/17 0916  . ondansetron (ZOFRAN) tablet 4 mg  4 mg Oral Q6H PRN Arnaldo Nataliamond, Halim S, MD       Or  . ondansetron [facility](ZOFRAN) injection 4 mg  4 mg Intravenous Q6H PRN Arnaldo Nataliamond, Amear S, MD   4 mg at 03/26/17 1645  . pantoprazole (PROTONIX) EC tablet 40 mg  40 mg Oral Daily Gardner CandleHallaji, Sheema M, RPH   40 mg at 03/31/17 0914  . tamsulosin (FLOMAX) capsule 0.4 mg  0.4 mg Oral Daily Arnaldo Nataliamond, Zekiel S, MD   0.4 mg at 03/31/17 0915  . thiamine (B-1) injection 100 mg  100 mg Intravenous Daily Auburn BilberryPatel, Shreyang, MD   100 mg at 03/31/17 1125  . thiamine (VITAMIN B-1) tablet 100 mg  100 mg Oral Daily Annett Fabianichards, Karol A, MD   100 mg at 03/31/17 [id number]  . vitamin B-12 (CYANOCOBALAMIN) tablet 2,000 mcg  2,000 mcg Oral Daily Arnaldo Nataliamond, Armstead S, MD   2,000 mcg at 03/31/17 [id number]  . vitamin C (ASCORBIC ACID) tablet 250 mg  250 mg Oral BID Auburn BilberryPatel, Shreyang, MD   250 mg at 03/31/17 2140     Discharge Medications: Please see discharge summary for a list of discharge medications.  Relevant Imaging Results:  Relevant Lab Results:   Additional Information ss: [id number]  York SpanielMonica Kathreen Dileo, LCSW

## 2017-04-01 NOTE — Care Management Important Message (Signed)
Important Message  Patient Details  Name: Marco RegisterMichael Skellenger MRN: [id number] Date of Birth: 1955/10/24   Medicare Important Message Given:  Yes    Chapman FitchBOWEN, Jeson Camacho T, RN 04/01/2017, 12:05 PM

## 2017-04-01 NOTE — Progress Notes (Signed)
[facility] called for transport. Tele and pulse ox removed.

## 2017-04-01 NOTE — Clinical Social Work Note (Signed)
[facility] Patient and wife are aware and in agreement to transfer there today. [facility] End[facility]Kelly [facility] and [facility] information has been sent. Patient to transfer via EMS. York SpanielMonica Lancer Thurner MSW,LCSW [phone]

## 2017-04-01 NOTE — Care Management (Signed)
[facility] would be [facility] to SNF.

## 2017-04-01 NOTE — Progress Notes (Signed)
Pt quite agitated, loud and cursing these past two shifts. Pt forgetful. Stated that his wife did not come all day when she was in fact visiting for most of the day, per nurse. Pt. still agitated mid morning , MD notified and Ativan 1 mg ordered and admin. Pt slept into the morning.

## 2017-04-01 NOTE — Clinical Social Work Note (Signed)
Patient's wife asked to speak with CSW in patient's room. Patient and wife stated that they now want STR. Patient is ready for discharge today. Bed search initiated. York SpanielMonica Brae Gartman MSW,LCW [phone]

## 2017-04-01 NOTE — Discharge Instructions (Signed)
Heat healthy diet. Fall precaution. Keep [place] until see urologist in clinic. O2 [place] 1-2 L .

## 2017-04-01 NOTE — Discharge Summary (Addendum)
[facility] [place] [facility]   PATIENT NAME: Marco RegisterMichael [name]    MR#:  [id number]  DATE OF BIRTH:  Jan 16, 1955  DATE OF ADMISSION:  03/25/2017   ADMITTING PHYSICIAN: Marco NatalMichael S Diamond, MD  DATE OF DISCHARGE: 04/01/2017 PRIMARY CARE PHYSICIAN: Marco MinaHedrick, James, MD   ADMISSION DIAGNOSIS:  HCAP (healthcare-associated pneumonia) [J18.9] Dyspnea, unspecified type [R06.00] DISCHARGE DIAGNOSIS:  Active Problems:   HCAP (healthcare-associated pneumonia)  SECONDARY DIAGNOSIS:   Past Medical History:  Diagnosis Date  . Anxiety   . Arthritis   . Depression   . Heartburn   . Seizures [facility](HCC)    HOSPITAL COURSE:  This is a 62 year old male admitted for pneumonia.  1.   Acute hypoxic respiratory failure due to B/L pneumonia possible ARDS Changed to Augmentin for 4 more days. Taper prednisone. 2. Acute respiratory failureWith ARDS from persistent bilateral HCAP /Aspiration pneumonia-  Oral antibiotics continue steroids slow to improve. He was give IV Lasix, hold due to dehydration. 3. Sepsis: present on admission, improving on Abx 4. Uncontrolled hypertension continue metoprolol labetalol as needed, blood pressure now stable 5.Hx of seizure disorder continue Keppra 6. Hx Nicotine dependency smoking cessation provided   PT: SNF.  DISCHARGE CONDITIONS:  Stable, discharge to SNF. CONSULTS OBTAINED:   DRUG ALLERGIES:   Allergies  Allergen Reactions  . Hydrocodone-Acetaminophen Nausea Only and Nausea And Vomiting   DISCHARGE MEDICATIONS:   Allergies as of 04/01/2017      Reactions   Hydrocodone-acetaminophen Nausea Only, Nausea And Vomiting      Medication List    TAKE these medications   albuterol 108 (90 Base) MCG/ACT inhaler Commonly known as:  PROVENTIL HFA;VENTOLIN HFA Inhale 2 puffs into the lungs every 6 (six) hours as needed for wheezing or shortness of breath.   amoxicillin-clavulanate 875-125 MG tablet Commonly known as:   AUGMENTIN Take 1 tablet by mouth every 12 (twelve) hours. What changed:  when to take this   cyanocobalamin 2000 MCG tablet Take 1 tablet (2,000 mcg total) by mouth daily.   feeding supplement (ENSURE ENLIVE) Liqd Take 237 mLs by mouth 2 (two) times daily between meals.   levETIRAcetam 750 MG tablet Commonly known as:  KEPPRA Take 1 tablet (750 mg total) by mouth 2 (two) times daily.   metoprolol tartrate 25 MG tablet Commonly known as:  LOPRESSOR Take 0.5 tablets (12.5 mg total) by mouth 2 (two) times daily.   mometasone-formoterol 200-5 MCG/ACT Aero Commonly known as:  DULERA Inhale 2 puffs into the lungs 2 (two) times daily.   multivitamin with minerals Tabs tablet Take 1 tablet by mouth daily.   nicotine 21 mg/24hr patch Commonly known as:  NICODERM CQ - dosed in mg/24 hours Place 1 patch (21 mg total) onto the skin daily.   pantoprazole 40 MG tablet Commonly known as:  PROTONIX Take 1 tablet (40 mg total) by mouth daily.   predniSONE 10 MG tablet Commonly known as:  DELTASONE 40 mg po daily for 1 day, 30 mg po daily for 1 day, 20 mg po daily for 1 day, 10 mg po daily for 1 day.   tamsulosin 0.4 MG Caps capsule Commonly known as:  FLOMAX Take 1 capsule (0.4 mg total) by mouth daily.   thiamine 100 MG tablet Take 1 tablet (100 mg total) by mouth daily.        DISCHARGE INSTRUCTIONS:  See AVS. If you experience worsening of your admission symptoms, develop shortness of breath, life threatening emergency, suicidal or  homicidal thoughts you must seek medical attention immediately by calling 911 or calling your MD immediately  if symptoms less severe.  You Must read complete instructions/literature along with all the possible adverse reactions/side effects for all the Medicines you take and that have been prescribed to you. Take any new Medicines after you have completely understood and accpet all the possible adverse reactions/side effects.   Please note  You  were cared for by a hospitalist during your hospital stay. If you have any questions about your discharge medications or the care you received while you were in the hospital after you are discharged, you can call the unit and asked to speak with the hospitalist on call if the hospitalist that took care of you is not available. Once you are discharged, your primary care physician will handle any further medical issues. Please note that NO REFILLS for any discharge medications will be authorized once you are discharged, as it is imperative that you return to your primary care physician (or establish a relationship with a primary care physician if you do not have one) for your aftercare needs so that they can reassess your need for medications and monitor your lab values.    On the day of Discharge:  VITAL SIGNS:  Blood pressure 122/66, pulse 90, temperature (!) 97.4 F (36.3 C), temperature source Axillary, resp. rate 18, height 5\' 6"  (1.676 m), weight 151 lb 10.8 oz (68.8 kg), SpO2 95 %. PHYSICAL EXAMINATION:  GENERAL:  62 y.o.-year-old patient lying in the bed with no acute distress.  EYES: Pupils equal, round, reactive to light and accommodation. No scleral icterus. Extraocular muscles intact.  HEENT: Head atraumatic, normocephalic. Oropharynx and nasopharynx clear.  NECK:  Supple, no jugular venous distention. No thyroid enlargement, no tenderness.  LUNGS: Normal breath sounds bilaterally, no wheezing, rales,rhonchi or crepitation. No use of accessory muscles of respiration.  CARDIOVASCULAR: S1, S2 normal. No murmurs, rubs, or gallops.  ABDOMEN: Soft, non-tender, non-distended. Bowel sounds present. No organomegaly or mass.  EXTREMITIES: No pedal edema, cyanosis, or clubbing.  NEUROLOGIC: Cranial nerves II through XII are intact. Muscle strength 4/5 in all extremities. Sensation intact. Gait not checked.  PSYCHIATRIC: The patient is alert and oriented x 3.  SKIN: No obvious rash, lesion, or  ulcer.  DATA REVIEW:   CBC Recent Labs  Lab 03/28/17 0553  WBC 10.9*  HGB 9.9*  HCT 30.0*  PLT 282    Chemistries  Recent Labs  Lab 03/26/17 0520  04/01/17 0849  NA  --    < > 145  K  --    < > 4.0  CL  --    < > 101  CO2  --    < > 36*  GLUCOSE  --    < > 110*  BUN  --    < > 35*  CREATININE  --    < > 0.71  CALCIUM  --    < > 9.1  MG  --    < > 2.1  AST 108*  --   --   ALT 36  --   --   ALKPHOS 212*  --   --   BILITOT 1.4*  --   --    < > = values in this interval not displayed.     Microbiology Results  Results for orders placed or performed during the hospital encounter of 03/25/17  MRSA PCR Screening     Status: None   Collection Time: 03/26/17  6:34 AM  Result Value Ref Range Status   MRSA by PCR NEGATIVE NEGATIVE Final    Comment:        The GeneXpert MRSA Assay (FDA approved for NASAL specimens only), is one component of a comprehensive MRSA colonization surveillance program. It is not intended to diagnose MRSA infection nor to guide or monitor treatment for MRSA infections. Performed at [facility], [address], [place], [place] [address]     RADIOLOGY:  Ct Head Wo Contrast  Result Date: 03/31/2017 CLINICAL DATA:  Altered level of consciousness. EXAM: CT HEAD WITHOUT CONTRAST TECHNIQUE: Contiguous axial images were obtained from the base of the skull through the vertex without intravenous contrast. COMPARISON:  03/17/2017 FINDINGS: Brain: No evidence of acute infarction, hemorrhage, hydrocephalus, extra-axial collection or mass lesion/mass effect. Mild cerebral volume loss. Vascular: No hyperdense vessel or unexpected calcification. Skull: Normal. Negative for fracture or focal lesion. Sinuses/Orbits: Partial left mastoid opacification, chronic. Negative nasopharynx. Probable retention cysts in the medial right maxillary sinus, stable from 2013. IMPRESSION: No acute finding or change from prior. Electronically Signed   By: Marnee Spring M.D.   On: 03/31/2017 11:38     Management plans discussed with the patient, his wife and they are in agreement.  CODE STATUS: Full Code   TOTAL TIME TAKING CARE OF THIS PATIENT: 37 minutes.    Shaune Pollack M.D on 04/01/2017 at 10:37 AM  Between 7am to 6pm - Pager - [phone]  After 6pm go to [email or web address] - Social research officer, government  [facility] [place] [facility]  Office  [phone]  CC: Primary care physician; Marco Mina, MD   Note: This dictation was prepared with Dragon dictation along with smaller phrase technology. Any transcriptional errors that result from this process are unintentional.

## 2017-04-06 ENCOUNTER — Emergency Department: Payer: Medicare Other

## 2017-04-06 ENCOUNTER — Inpatient Hospital Stay
Admission: EM | Admit: 2017-04-06 | Discharge: 2017-04-16 | DRG: 871 | Disposition: A | Discharge status: Skilled Nursing Facility | Payer: Medicare Other | Attending: Family Medicine | Admitting: Family Medicine

## 2017-04-06 ENCOUNTER — Encounter: Payer: Self-pay | Admitting: Emergency Medicine

## 2017-04-06 ENCOUNTER — Other Ambulatory Visit: Payer: Self-pay

## 2017-04-06 DIAGNOSIS — J9621 Acute and chronic respiratory failure with hypoxia: Secondary | ICD-10-CM | POA: Diagnosis present

## 2017-04-06 DIAGNOSIS — R059 Cough, unspecified: Secondary | ICD-10-CM

## 2017-04-06 DIAGNOSIS — R0602 Shortness of breath: Secondary | ICD-10-CM

## 2017-04-06 DIAGNOSIS — J441 Chronic obstructive pulmonary disease with (acute) exacerbation: Secondary | ICD-10-CM | POA: Diagnosis present

## 2017-04-06 DIAGNOSIS — F329 Major depressive disorder, single episode, unspecified: Secondary | ICD-10-CM | POA: Diagnosis present

## 2017-04-06 DIAGNOSIS — L89009 Pressure ulcer of unspecified elbow, unspecified stage: Secondary | ICD-10-CM | POA: Diagnosis present

## 2017-04-06 DIAGNOSIS — Z792 Long term (current) use of antibiotics: Secondary | ICD-10-CM

## 2017-04-06 DIAGNOSIS — I10 Essential (primary) hypertension: Secondary | ICD-10-CM | POA: Diagnosis present

## 2017-04-06 DIAGNOSIS — A419 Sepsis, unspecified organism: Secondary | ICD-10-CM | POA: Diagnosis present

## 2017-04-06 DIAGNOSIS — E876 Hypokalemia: Secondary | ICD-10-CM | POA: Diagnosis present

## 2017-04-06 DIAGNOSIS — F419 Anxiety disorder, unspecified: Secondary | ICD-10-CM | POA: Diagnosis present

## 2017-04-06 DIAGNOSIS — G40909 Epilepsy, unspecified, not intractable, without status epilepticus: Secondary | ICD-10-CM | POA: Diagnosis present

## 2017-04-06 DIAGNOSIS — J9601 Acute respiratory failure with hypoxia: Secondary | ICD-10-CM | POA: Diagnosis present

## 2017-04-06 DIAGNOSIS — Z833 Family history of diabetes mellitus: Secondary | ICD-10-CM | POA: Diagnosis not present

## 2017-04-06 DIAGNOSIS — J440 Chronic obstructive pulmonary disease with acute lower respiratory infection: Secondary | ICD-10-CM | POA: Diagnosis present

## 2017-04-06 DIAGNOSIS — J90 Pleural effusion, not elsewhere classified: Secondary | ICD-10-CM | POA: Diagnosis present

## 2017-04-06 DIAGNOSIS — Y95 Nosocomial condition: Secondary | ICD-10-CM | POA: Diagnosis present

## 2017-04-06 DIAGNOSIS — F17210 Nicotine dependence, cigarettes, uncomplicated: Secondary | ICD-10-CM | POA: Diagnosis present

## 2017-04-06 DIAGNOSIS — E872 Acidosis: Secondary | ICD-10-CM | POA: Diagnosis present

## 2017-04-06 DIAGNOSIS — R410 Disorientation, unspecified: Secondary | ICD-10-CM | POA: Diagnosis not present

## 2017-04-06 DIAGNOSIS — Y92230 Patient room in hospital as the place of occurrence of the external cause: Secondary | ICD-10-CM | POA: Diagnosis not present

## 2017-04-06 DIAGNOSIS — J189 Pneumonia, unspecified organism: Secondary | ICD-10-CM | POA: Diagnosis present

## 2017-04-06 DIAGNOSIS — T380X5A Adverse effect of glucocorticoids and synthetic analogues, initial encounter: Secondary | ICD-10-CM | POA: Diagnosis not present

## 2017-04-06 DIAGNOSIS — R0600 Dyspnea, unspecified: Secondary | ICD-10-CM

## 2017-04-06 DIAGNOSIS — Z79899 Other long term (current) drug therapy: Secondary | ICD-10-CM

## 2017-04-06 DIAGNOSIS — G9341 Metabolic encephalopathy: Secondary | ICD-10-CM | POA: Diagnosis present

## 2017-04-06 DIAGNOSIS — Z885 Allergy status to narcotic agent status: Secondary | ICD-10-CM | POA: Diagnosis not present

## 2017-04-06 DIAGNOSIS — L8990 Pressure ulcer of unspecified site, unspecified stage: Secondary | ICD-10-CM

## 2017-04-06 DIAGNOSIS — Z5320 Procedure and treatment not carried out because of patient's decision for unspecified reasons: Secondary | ICD-10-CM | POA: Diagnosis not present

## 2017-04-06 DIAGNOSIS — I272 Pulmonary hypertension, unspecified: Secondary | ICD-10-CM | POA: Diagnosis present

## 2017-04-06 DIAGNOSIS — Z716 Tobacco abuse counseling: Secondary | ICD-10-CM

## 2017-04-06 DIAGNOSIS — Z803 Family history of malignant neoplasm of breast: Secondary | ICD-10-CM

## 2017-04-06 DIAGNOSIS — J9690 Respiratory failure, unspecified, unspecified whether with hypoxia or hypercapnia: Secondary | ICD-10-CM

## 2017-04-06 DIAGNOSIS — D638 Anemia in other chronic diseases classified elsewhere: Secondary | ICD-10-CM | POA: Diagnosis present

## 2017-04-06 DIAGNOSIS — R05 Cough: Secondary | ICD-10-CM

## 2017-04-06 DIAGNOSIS — J810 Acute pulmonary edema: Secondary | ICD-10-CM | POA: Diagnosis present

## 2017-04-06 DIAGNOSIS — J9600 Acute respiratory failure, unspecified whether with hypoxia or hypercapnia: Secondary | ICD-10-CM

## 2017-04-06 LAB — CBC WITH DIFFERENTIAL/PLATELET
Basophils Absolute: 0 10*3/uL (ref 0–0.1)
Basophils Relative: 0 %
EOS ABS: 0 10*3/uL (ref 0–0.7)
EOS PCT: 0 %
HCT: 25.5 % — ABNORMAL LOW (ref 40.0–52.0)
HEMOGLOBIN: 8.4 g/dL — AB (ref 13.0–18.0)
LYMPHS ABS: 0.8 10*3/uL — AB (ref 1.0–3.6)
Lymphocytes Relative: 6 %
MCH: 33.2 pg (ref 26.0–34.0)
MCHC: 32.8 g/dL (ref 32.0–36.0)
MCV: 101.3 fL — ABNORMAL HIGH (ref 80.0–100.0)
MONOS PCT: 6 %
Monocytes Absolute: 0.8 10*3/uL (ref 0.2–1.0)
NEUTROS PCT: 88 %
Neutro Abs: 11.5 10*3/uL — ABNORMAL HIGH (ref 1.4–6.5)
Platelets: 255 10*3/uL (ref 150–440)
RBC: 2.52 MIL/uL — ABNORMAL LOW (ref 4.40–5.90)
RDW: 15 % — ABNORMAL HIGH (ref 11.5–14.5)
WBC: 13.2 10*3/uL — ABNORMAL HIGH (ref 3.8–10.6)

## 2017-04-06 LAB — LACTIC ACID, PLASMA
LACTIC ACID, VENOUS: 1.5 mmol/L (ref 0.5–1.9)
Lactic Acid, Venous: 2.6 mmol/L (ref 0.5–1.9)
Lactic Acid, Venous: 1.9 mmol/L (ref 0.5–1.9)
Lactic Acid, Venous: 1 mmol/L (ref 0.5–1.9)

## 2017-04-06 LAB — COMPREHENSIVE METABOLIC PANEL
ALBUMIN: 2.5 g/dL — AB (ref 3.5–5.0)
ALT: 17 U/L (ref 17–63)
ALT: 15 U/L — AB (ref 17–63)
AST: 31 U/L (ref 15–41)
AST: 18 U/L (ref 15–41)
Albumin: 2.5 g/dL — ABNORMAL LOW (ref 3.5–5.0)
Alkaline Phosphatase: 91 U/L (ref 38–126)
Alkaline Phosphatase: 93 U/L (ref 38–126)
Anion gap: 9 (ref 5–15)
Anion gap: 6 (ref 5–15)
BUN: 11 mg/dL (ref 6–20)
BUN: 13 mg/dL (ref 6–20)
CHLORIDE: 107 mmol/L (ref 101–111)
CHLORIDE: 113 mmol/L — AB (ref 101–111)
CO2: 23 mmol/L (ref 22–32)
CO2: 23 mmol/L (ref 22–32)
CREATININE: 0.63 mg/dL (ref 0.61–1.24)
CREATININE: 0.55 mg/dL — AB (ref 0.61–1.24)
Calcium: 8.4 mg/dL — ABNORMAL LOW (ref 8.9–10.3)
Calcium: 8.3 mg/dL — ABNORMAL LOW (ref 8.9–10.3)
GFR calc Af Amer: >60 mL/min (ref >60)
GFR calc Af Amer: >60 mL/min (ref >60)
GFR calc non Af Amer: >60 mL/min (ref >60)
GLUCOSE: 116 mg/dL — AB (ref 65–99)
Glucose, Bld: 168 mg/dL — ABNORMAL HIGH (ref 65–99)
POTASSIUM: 3.7 mmol/L (ref 3.5–5.1)
Potassium: 3.4 mmol/L — ABNORMAL LOW (ref 3.5–5.1)
SODIUM: 139 mmol/L (ref 135–145)
SODIUM: 142 mmol/L (ref 135–145)
Total Bilirubin: 1.5 mg/dL — ABNORMAL HIGH (ref 0.3–1.2)
Total Bilirubin: 1 mg/dL (ref 0.3–1.2)
Total Protein: 6.2 g/dL — ABNORMAL LOW (ref 6.5–8.1)
Total Protein: 6 g/dL — ABNORMAL LOW (ref 6.5–8.1)

## 2017-04-06 LAB — BLOOD GAS, ARTERIAL
Acid-Base Excess: 0.4 mmol/L (ref 0.0–2.0)
Acid-base deficit: 0.8 mmol/L (ref 0.0–2.0)
BICARBONATE: 22.3 mmol/L (ref 20.0–28.0)
Bicarbonate: 23.4 mmol/L (ref 20.0–28.0)
Delivery systems: POSITIVE
EXPIRATORY PAP: 6
FIO2: 0.38
FIO2: 0.4
Inspiratory PAP: 14
O2 SAT: 94.5 %
O2 Saturation: 94.8 %
PATIENT TEMPERATURE: 37
PCO2 ART: 30 mmHg — AB (ref 32.0–48.0)
PH ART: 7.48 — AB (ref 7.350–7.450)
PH ART: 7.5 — AB (ref 7.350–7.450)
Patient temperature: 37
pCO2 arterial: 30 mmHg — ABNORMAL LOW (ref 32.0–48.0)
pO2, Arterial: 69 mmHg — ABNORMAL LOW (ref 83.0–108.0)
pO2, Arterial: 66 mmHg — ABNORMAL LOW (ref 83.0–108.0)

## 2017-04-06 LAB — PHOSPHORUS: Phosphorus: 3.7 mg/dL (ref 2.5–4.6)

## 2017-04-06 LAB — GLUCOSE, CAPILLARY: GLUCOSE-CAPILLARY: 146 mg/dL — AB (ref 65–99)

## 2017-04-06 LAB — CBC
HEMATOCRIT: 24.2 % — AB (ref 40.0–52.0)
HEMOGLOBIN: 8 g/dL — AB (ref 13.0–18.0)
MCH: 33.2 pg (ref 26.0–34.0)
MCHC: 33.2 g/dL (ref 32.0–36.0)
MCV: 100.1 fL — ABNORMAL HIGH (ref 80.0–100.0)
Platelets: 240 10*3/uL (ref 150–440)
RBC: 2.41 MIL/uL — AB (ref 4.40–5.90)
RDW: 15.3 % — ABNORMAL HIGH (ref 11.5–14.5)
WBC: 13.1 10*3/uL — AB (ref 3.8–10.6)

## 2017-04-06 LAB — PROTIME-INR
INR: 1.15
PROTHROMBIN TIME: 14.6 s (ref 11.4–15.2)

## 2017-04-06 LAB — TROPONIN I

## 2017-04-06 LAB — INFLUENZA PANEL BY PCR (TYPE A & B)
Influenza A By PCR: NEGATIVE
Influenza B By PCR: NEGATIVE

## 2017-04-06 LAB — MAGNESIUM: MAGNESIUM: 2.2 mg/dL (ref 1.7–2.4)

## 2017-04-06 LAB — MRSA PCR SCREENING: MRSA by PCR: NEGATIVE

## 2017-04-06 LAB — PROCALCITONIN: Procalcitonin: <0.1 ng/mL

## 2017-04-06 MED ORDER — SODIUM CHLORIDE 0.9 % IV SOLN
2.0000 g | Freq: Three times a day (TID) | INTRAVENOUS | Status: DC
  Administered 2017-04-06 – 2017-04-07 (×2): 2 g via INTRAVENOUS
  Filled 2017-04-06 (×3): qty 2

## 2017-04-06 MED ORDER — IPRATROPIUM-ALBUTEROL 0.5-2.5 (3) MG/3ML IN SOLN
3.0000 mL | Freq: Once | RESPIRATORY_TRACT | Status: AC
  Administered 2017-04-06: 3 mL via RESPIRATORY_TRACT
  Filled 2017-04-06: qty 3

## 2017-04-06 MED ORDER — IPRATROPIUM-ALBUTEROL 0.5-2.5 (3) MG/3ML IN SOLN
3.0000 mL | Freq: Four times a day (QID) | RESPIRATORY_TRACT | Status: DC
Start: 2017-04-06 — End: 2017-04-09
  Administered 2017-04-06 – 2017-04-09 (×12): 3 mL via RESPIRATORY_TRACT
  Filled 2017-04-06 (×12): qty 3

## 2017-04-06 MED ORDER — SODIUM CHLORIDE 0.9 % IV BOLUS
1000.0000 mL | Freq: Once | INTRAVENOUS | Status: AC
  Administered 2017-04-06: 1000 mL via INTRAVENOUS

## 2017-04-06 MED ORDER — ALBUTEROL SULFATE HFA 108 (90 BASE) MCG/ACT IN AERS: 2.0000 | INHALATION_SPRAY | Freq: Four times a day (QID) | RESPIRATORY_TRACT | Status: DC | PRN

## 2017-04-06 MED ORDER — VITAMIN B-12 1000 MCG PO TABS
2000.0000 ug | ORAL_TABLET | Freq: Every day | ORAL | Status: DC
  Administered 2017-04-07 – 2017-04-16 (×10): 2000 ug via ORAL
  Filled 2017-04-06 (×10): qty 2

## 2017-04-06 MED ORDER — ACETAMINOPHEN 500 MG PO TABS
1000.0000 mg | ORAL_TABLET | Freq: Once | ORAL | Status: AC
  Administered 2017-04-06: 1000 mg via ORAL
  Filled 2017-04-06: qty 2

## 2017-04-06 MED ORDER — VITAMIN B-1 100 MG PO TABS
100.0000 mg | ORAL_TABLET | Freq: Every day | ORAL | Status: DC
  Administered 2017-04-07 – 2017-04-16 (×10): 100 mg via ORAL
  Filled 2017-04-06 (×10): qty 1

## 2017-04-06 MED ORDER — TAMSULOSIN HCL 0.4 MG PO CAPS
0.4000 mg | ORAL_CAPSULE | Freq: Every day | ORAL | Status: DC
  Administered 2017-04-06 – 2017-04-16 (×11): 0.4 mg via ORAL
  Filled 2017-04-06 (×12): qty 1

## 2017-04-06 MED ORDER — SODIUM CHLORIDE 0.9 % IV BOLUS
1000.0000 mL | Freq: Once | INTRAVENOUS | Status: AC
Start: 2017-04-06 — End: 2017-04-06
  Administered 2017-04-06: 1000 mL via INTRAVENOUS

## 2017-04-06 MED ORDER — IPRATROPIUM-ALBUTEROL 0.5-2.5 (3) MG/3ML IN SOLN
3.0000 mL | Freq: Once | RESPIRATORY_TRACT | Status: AC
Start: 2017-04-06 — End: 2017-04-06
  Administered 2017-04-06: 3 mL via RESPIRATORY_TRACT
  Filled 2017-04-06: qty 3

## 2017-04-06 MED ORDER — VANCOMYCIN HCL IN DEXTROSE 1-5 GM/200ML-% IV SOLN
1000.0000 mg | Freq: Once | INTRAVENOUS | Status: AC
  Administered 2017-04-06: 1000 mg via INTRAVENOUS
  Filled 2017-04-06: qty 200

## 2017-04-06 MED ORDER — ACETAMINOPHEN 325 MG PO TABS: 650.0000 mg | ORAL_TABLET | Freq: Four times a day (QID) | ORAL | Status: DC | PRN

## 2017-04-06 MED ORDER — FUROSEMIDE 10 MG/ML IJ SOLN
20.0000 mg | Freq: Once | INTRAMUSCULAR | Status: AC
  Administered 2017-04-06: 20 mg via INTRAVENOUS
  Filled 2017-04-06: qty 4

## 2017-04-06 MED ORDER — METHYLPREDNISOLONE SODIUM SUCC 125 MG IJ SOLR
60.0000 mg | Freq: Once | INTRAMUSCULAR | Status: AC
  Administered 2017-04-06: 60 mg via INTRAVENOUS
  Filled 2017-04-06: qty 2

## 2017-04-06 MED ORDER — VANCOMYCIN HCL IN DEXTROSE 1-5 GM/200ML-% IV SOLN
1000.0000 mg | Freq: Two times a day (BID) | INTRAVENOUS | Status: DC
  Administered 2017-04-06: 1000 mg via INTRAVENOUS
  Filled 2017-04-06 (×3): qty 200

## 2017-04-06 MED ORDER — LEVETIRACETAM 750 MG PO TABS
750.0000 mg | ORAL_TABLET | Freq: Two times a day (BID) | ORAL | Status: DC
  Administered 2017-04-06 – 2017-04-07 (×3): 750 mg via ORAL
  Filled 2017-04-06 (×5): qty 1

## 2017-04-06 MED ORDER — ALBUTEROL SULFATE (2.5 MG/3ML) 0.083% IN NEBU
2.5000 mg | INHALATION_SOLUTION | RESPIRATORY_TRACT | Status: DC | PRN
  Administered 2017-04-07: 2.5 mg via RESPIRATORY_TRACT
  Filled 2017-04-06: qty 3

## 2017-04-06 MED ORDER — BUDESONIDE 0.25 MG/2ML IN SUSP
0.2500 mg | Freq: Two times a day (BID) | RESPIRATORY_TRACT | Status: DC
  Administered 2017-04-07 – 2017-04-09 (×6): 0.25 mg via RESPIRATORY_TRACT
  Filled 2017-04-06 (×5): qty 2

## 2017-04-06 MED ORDER — ORAL CARE MOUTH RINSE
15.0000 mL | Freq: Two times a day (BID) | OROMUCOSAL | Status: DC
  Administered 2017-04-07 – 2017-04-08 (×3): 15 mL via OROMUCOSAL

## 2017-04-06 MED ORDER — PANTOPRAZOLE SODIUM 40 MG PO TBEC
40.0000 mg | DELAYED_RELEASE_TABLET | Freq: Every day | ORAL | Status: DC
  Administered 2017-04-07 – 2017-04-16 (×10): 40 mg via ORAL
  Filled 2017-04-06 (×10): qty 1

## 2017-04-06 MED ORDER — BUDESONIDE 0.25 MG/2ML IN SUSP
0.2500 mg | Freq: Two times a day (BID) | RESPIRATORY_TRACT | Status: DC
  Filled 2017-04-06: qty 2

## 2017-04-06 MED ORDER — MAGNESIUM SULFATE 2 GM/50ML IV SOLN
2.0000 g | Freq: Once | INTRAVENOUS | Status: AC
  Administered 2017-04-06: 2 g via INTRAVENOUS
  Filled 2017-04-06: qty 50

## 2017-04-06 MED ORDER — METHYLPREDNISOLONE SODIUM SUCC 125 MG IJ SOLR
60.0000 mg | Freq: Four times a day (QID) | INTRAMUSCULAR | Status: DC
  Administered 2017-04-06 – 2017-04-09 (×11): 60 mg via INTRAVENOUS
  Filled 2017-04-06 (×11): qty 2

## 2017-04-06 MED ORDER — MOMETASONE FURO-FORMOTEROL FUM 200-5 MCG/ACT IN AERO
2.0000 | INHALATION_SPRAY | Freq: Two times a day (BID) | RESPIRATORY_TRACT | Status: DC
  Filled 2017-04-06: qty 8.8

## 2017-04-06 MED ORDER — ALBUTEROL SULFATE (2.5 MG/3ML) 0.083% IN NEBU: 2.5000 mg | INHALATION_SOLUTION | Freq: Four times a day (QID) | RESPIRATORY_TRACT | Status: DC

## 2017-04-06 MED ORDER — SODIUM CHLORIDE 0.9 % IV SOLN
2.0000 g | Freq: Once | INTRAVENOUS | Status: AC
  Administered 2017-04-06: 2 g via INTRAVENOUS
  Filled 2017-04-06: qty 2

## 2017-04-06 MED ORDER — ONDANSETRON HCL 4 MG PO TABS: 4.0000 mg | ORAL_TABLET | Freq: Four times a day (QID) | ORAL | Status: DC | PRN

## 2017-04-06 MED ORDER — SENNA 8.6 MG PO TABS
1.0000 | ORAL_TABLET | Freq: Two times a day (BID) | ORAL | Status: DC
  Administered 2017-04-06 – 2017-04-09 (×6): 8.6 mg via ORAL
  Filled 2017-04-06 (×6): qty 1

## 2017-04-06 MED ORDER — ADULT MULTIVITAMIN W/MINERALS CH
1.0000 | ORAL_TABLET | Freq: Every day | ORAL | Status: DC
  Administered 2017-04-07 – 2017-04-16 (×9): 1 via ORAL
  Filled 2017-04-06 (×10): qty 1

## 2017-04-06 MED ORDER — IPRATROPIUM-ALBUTEROL 0.5-2.5 (3) MG/3ML IN SOLN
3.0000 mL | Freq: Four times a day (QID) | RESPIRATORY_TRACT | Status: DC | PRN
  Filled 2017-04-06: qty 3

## 2017-04-06 MED ORDER — ONDANSETRON HCL 4 MG/2ML IJ SOLN: 4.0000 mg | Freq: Four times a day (QID) | INTRAMUSCULAR | Status: DC | PRN

## 2017-04-06 MED ORDER — METHYLPREDNISOLONE SODIUM SUCC 125 MG IJ SOLR
60.0000 mg | INTRAMUSCULAR | Status: DC
  Administered 2017-04-06: 60 mg via INTRAVENOUS
  Filled 2017-04-06: qty 2

## 2017-04-06 MED ORDER — METOPROLOL TARTRATE 25 MG PO TABS
12.5000 mg | ORAL_TABLET | Freq: Two times a day (BID) | ORAL | Status: DC
  Administered 2017-04-06 – 2017-04-16 (×20): 12.5 mg via ORAL
  Filled 2017-04-06 (×21): qty 1

## 2017-04-06 MED ORDER — CHLORHEXIDINE GLUCONATE 0.12 % MT SOLN
15.0000 mL | Freq: Two times a day (BID) | OROMUCOSAL | Status: DC
  Administered 2017-04-06 – 2017-04-09 (×5): 15 mL via OROMUCOSAL
  Filled 2017-04-06 (×5): qty 15

## 2017-04-06 MED ORDER — ACETAMINOPHEN 650 MG RE SUPP: 650.0000 mg | Freq: Four times a day (QID) | RECTAL | Status: DC | PRN

## 2017-04-06 MED ORDER — NICOTINE 21 MG/24HR TD PT24
21.0000 mg | MEDICATED_PATCH | Freq: Every day | TRANSDERMAL | Status: DC
  Administered 2017-04-07 – 2017-04-16 (×10): 21 mg via TRANSDERMAL
  Filled 2017-04-06 (×10): qty 1

## 2017-04-06 MED ORDER — ENOXAPARIN SODIUM 40 MG/0.4ML ~~LOC~~ SOLN
40.0000 mg | SUBCUTANEOUS | Status: DC
  Administered 2017-04-06 – 2017-04-07 (×2): 40 mg via SUBCUTANEOUS
  Filled 2017-04-06 (×2): qty 0.4

## 2017-04-06 NOTE — Progress Notes (Signed)
[facility] - [place] at [facility]   PATIENT NAME: Marco [name]    MR#:  [id number]  DATE OF BIRTH:  07/22/1955  SUBJECTIVE:  CHIEF COMPLAINT: Worsening of shortness of breath, working hard to breathe  REVIEW OF SYSTEMS:  CONSTITUTIONAL: No fever, fatigue or weakness.  EYES: No blurred or double vision.  EARS, NOSE, AND THROAT: No tinnitus or ear pain.  RESPIRATORY:  cough, worsening of shortness of breath, wheezing , denies hemoptysis.  CARDIOVASCULAR: No chest pain, orthopnea, edema.  GASTROINTESTINAL: No nausea, vomiting, diarrhea or abdominal pain.  GENITOURINARY: No dysuria, hematuria.  ENDOCRINE: No polyuria, nocturia,  HEMATOLOGY: No anemia, easy bruising or bleeding SKIN: No rash or lesion. MUSCULOSKELETAL: No joint pain or arthritis.   NEUROLOGIC: No tingling, numbness, weakness.  PSYCHIATRY: No anxiety or depression.   DRUG ALLERGIES:   Allergies  Allergen Reactions  . Hydrocodone-Acetaminophen Nausea Only and Nausea And Vomiting    VITALS:  Blood pressure (!) 151/96, pulse (!) 107, temperature 97.8 F (36.6 C), resp. rate (!) 22, height 5\' 6"  (1.676 m), weight 68.5 kg (151 lb), SpO2 97 %.  PHYSICAL EXAMINATION:  GENERAL:  62 y.o.-year-old patient lying in the bed with no acute distress.  EYES: Pupils equal, round, reactive to light and accommodation. No scleral icterus. Extraocular muscles intact.  HEENT: Head atraumatic, normocephalic. Oropharynx and nasopharynx clear.  NECK:  Supple, no jugular venous distention. No thyroid enlargement, no tenderness.  LUNGS: Diminished breath sounds bilaterally, coarse wheezing, no rales,rhonchi or crepitation.  Using all accessory muscles of respiration.  CARDIOVASCULAR: S1, S2 normal. No murmurs, rubs, or gallops.  ABDOMEN: Soft, nontender, nondistended. Bowel sounds present. No organomegaly or mass.  EXTREMITIES: No pedal edema, cyanosis, or clubbing.  NEUROLOGIC: Cranial nerves II through XII  are intact. Muscle strength generalized weakness in all extremities. Sensation intact. Gait not checked.  PSYCHIATRIC: The patient is alert and oriented x 3.  SKIN: No obvious rash, lesion, or ulcer.    LABORATORY PANEL:   CBC Recent Labs  Lab 04/06/17 1022  WBC 13.2*  HGB 8.4*  HCT 25.5*  PLT 255   ------------------------------------------------------------------------------------------------------------------  Chemistries  Recent Labs  Lab 04/01/17 0849 04/06/17 1022  NA 145 139  K 4.0 3.4*  CL 101 107  CO2 36* 23  GLUCOSE 110* 116*  BUN 35* 11  CREATININE 0.71 0.63  CALCIUM 9.1 8.4*  MG 2.1  --   AST  --  31  ALT  --  17  ALKPHOS  --  91  BILITOT  --  1.5*   ------------------------------------------------------------------------------------------------------------------  Cardiac Enzymes No results for input(s): TROPONINI in the last 168 hours. ------------------------------------------------------------------------------------------------------------------  RADIOLOGY:  Dg Chest Port 1 View  Result Date: 04/06/2017 CLINICAL DATA:  Shortness of Breath EXAM: PORTABLE CHEST 1 VIEW COMPARISON:  03/28/2017 FINDINGS: Cardiac shadow is stable. Lungs are well aerated bilaterally with persistent interstitial and alveolar opacities stable from the previous study. No sizable effusion is seen. No pneumothorax is noted. No bony abnormality is noted IMPRESSION: Stable appearance of the chest when compare with the prior study. Electronically Signed   By: Alcide Clever M.D.   On: 04/06/2017 10:55    EKG:   Orders placed or performed during the hospital encounter of 04/06/17  . ED EKG 12-Lead  . ED EKG 12-Lead  . EKG 12-Lead  . EKG 12-Lead    ASSESSMENT AND PLAN:    [name]  is a 62 y.o. male with a known history of smoking,  hypertension, history of seizure disorder, COPD recently admitted with hypoxic respiratory failure remained on BiPAP chest x-rays suggestive  of ARDS/pneumonia was discharged to [facility] five days ago comes back with shortness of breath and lethargic.  1. acute on chronic respiratory failure with hypoxia secondary to COPD exacerbation -Patient's respiratory status is deteriorating using all accessory muscles Given stat magnesium, Solu-Medrol, Lasix-and bronchodilator treatments with no improvement High flow oxygen tried with no success we will start him on BiPAP and transfer to stepdown unit -Discussed with intensivist -Continue Solu-Medrol, inhaler, nebulizer -Pro calcitonin ordered, lactic acid elevated start patient on empiric antibiotics Patient already got broad-spectrum antibiotic in the emergency room -just finished a 10 day course of antibiotic -IV fluids -Cycle lactic acid levels -Follow-up on the blood cultures  2. hypertension continue home meds  3. Three of seizure disorder continue Keppra  4. DVTprophylaxis subcu Lovenox      All the records are reviewed and case discussed with Care Management/Social Workerr. Management plans discussed with the patient, family and they are in agreement.  CODE STATUS: fc , wife HCPOA  TOTAL CRITICAL CARE TIME TAKING CARE OF THIS PATIENT: 42  minutes.   POSSIBLE D/C IN 2-3 DAYS, DEPENDING ON CLINICAL CONDITION.  Note: This dictation was prepared with Dragon dictation along with smaller phrase technology. Any transcriptional errors that result from this process are unintentional.   Ramonita LabAruna Masa Lubin M.D on 04/06/2017 at 7:08 PM  Between 7am to 6pm - Pager - [phone] After 6pm go to [email or web address] - password [email or web address] [facility]  [facility]  Office  [phone]  CC: Primary care physician; Jerl MinaHedrick, James, MD

## 2017-04-06 NOTE — ED Notes (Signed)
Taken to floor by Ed tech  On monitor and portable oxygen

## 2017-04-06 NOTE — Progress Notes (Signed)
Patient placed on HFNC due to WOB, Rn aware and MD paged for notification by RN. Saturations are 97% on HFNC

## 2017-04-06 NOTE — ED Triage Notes (Signed)
Pt brought by EMS from [facility] with respiratory distress reports seen in the hospital diagnosed with Pneumonia has not started antibiotics. Fevers given tylenol in the morning per EMS

## 2017-04-06 NOTE — NC FL2 (Signed)
[place] MEDICAID FL2 LEVEL OF CARE SCREENING TOOL     IDENTIFICATION  Patient Name: Marco [name] Birthdate: 1955/05/09 Sex: male Admission Date (Current Location): 04/06/2017  [place] Number:  Chiropodist and Address:  [facility], [address], [place], [place] [address]      Provider Number: [id number]  Attending Physician Name and Address:  Enedina Finner, MD  Relative Name and Phone Number:    Bertram Millard [phone]  Current Level of Care: Hospital Recommended Level of Care: Skilled Nursing Facility Prior Approval Number:    Date Approved/Denied:   PASRR Number:   [id number]   Discharge Plan: SNF    Current Diagnoses: Patient Active Problem List   Diagnosis Date Noted  . Acute on chronic respiratory failure with hypoxia (HCC) 04/06/2017  . HCAP (healthcare-associated pneumonia) 03/26/2017  . Malnutrition of moderate degree 03/22/2017  . Sepsis (HCC) 03/21/2017  . Hyponatremia 03/12/2017  . Acute encephalopathy 03/04/2017    Orientation RESPIRATION BLADDER Height & Weight     Self, Time, Situation, Place  Normal, O2 Continent Weight: 151 lb (68.5 kg) Height:  5\' 6"  (167.6 cm)  BEHAVIORAL SYMPTOMS/MOOD NEUROLOGICAL BOWEL NUTRITION STATUS      Continent Diet(dysphasia 3)  AMBULATORY STATUS COMMUNICATION OF NEEDS Skin   Extensive Assist Verbally Normal                       Personal Care Assistance Level of Assistance  Bathing, Feeding, Dressing Bathing Assistance: Maximum assistance Feeding assistance: Maximum assistance Dressing Assistance: Maximum assistance     Functional Limitations Info  Sight, Hearing, Speech Sight Info: Impaired Hearing Info: Adequate Speech Info: Adequate    SPECIAL CARE FACTORS FREQUENCY                       Contractures Contractures Info: Not present    Additional Factors Info   Allergies- Hydrocoedone               Current Medications  (04/06/2017):  This is the current hospital active medication list Current Facility-Administered Medications  Medication Dose Route Frequency Provider Last Rate Last Dose  . methylPREDNISolone sodium succinate (SOLU-MEDROL) 125 mg/2 mL injection 60 mg  60 mg Intravenous Q24H Enedina Finner, MD   60 mg at 04/06/17 1206  . sodium chloride 0.9 % bolus 1,000 mL  1,000 mL Intravenous Once Nita Sickle, MD       Current Outpatient Medications  Medication Sig Dispense Refill  . azithromycin (ZITHROMAX) 250 MG tablet Take 250 mg by mouth daily.    . cefTRIAXone (ROCEPHIN) IVPB Inject 1 g into the vein daily.    . cyanocobalamin 2000 MCG tablet Take 1 tablet (2,000 mcg total) by mouth daily. 30 tablet 0  . ipratropium-albuterol (DUONEB) 0.5-2.5 (3) MG/3ML SOLN Take 3 mLs by nebulization every 6 (six) hours as needed (FOR PNEUMONIA SYMPTOMS).    Marland Kitchen levETIRAcetam (KEPPRA) 750 MG tablet Take 1 tablet (750 mg total) by mouth 2 (two) times daily. 30 tablet 0  . metoprolol tartrate (LOPRESSOR) 25 MG tablet Take 0.5 tablets (12.5 mg total) by mouth 2 (two) times daily. 60 tablet 0  . Multiple Vitamin (MULTIVITAMIN WITH MINERALS) TABS tablet Take 1 tablet by mouth daily.    . nicotine (NICODERM CQ - DOSED IN MG/24 HOURS) 21 mg/24hr patch Place 1 patch (21 mg total) onto the skin daily. 28 patch 0  . pantoprazole (PROTONIX) 40 MG tablet Take  1 tablet (40 mg total) by mouth daily. 30 tablet 0  . tamsulosin (FLOMAX) 0.4 MG CAPS capsule Take 1 capsule (0.4 mg total) by mouth daily. 30 capsule 0  . thiamine 100 MG tablet Take 1 tablet (100 mg total) by mouth daily.    Marland Kitchen. tuberculin (TUBERSOL) 5 UNIT/0.1ML injection Inject 0.1 mLs into the skin every 7 (seven) days.    Marland Kitchen. albuterol (PROVENTIL HFA;VENTOLIN HFA) 108 (90 Base) MCG/ACT inhaler Inhale 2 puffs into the lungs every 6 (six) hours as needed for wheezing or shortness of breath. 1 Inhaler 2  . feeding supplement, ENSURE ENLIVE, (ENSURE ENLIVE) LIQD Take 237 mLs by  mouth 2 (two) times daily between meals. 237 mL 12  . mometasone-formoterol (DULERA) 200-5 MCG/ACT AERO Inhale 2 puffs into the lungs 2 (two) times daily. (Patient taking differently: Inhale 2 puffs into the lungs 2 (two) times daily. ) 1 Inhaler 2     Discharge Medications: Please see discharge summary for a list of discharge medications.  Relevant Imaging Results:  Relevant Lab Results:   Additional Information ss: [id number]  Cheron SchaumannBandi, Naman Spychalski M, LCSW

## 2017-04-06 NOTE — ED Notes (Signed)
Per Dr. Don PerkingVeronese to try pt off Bipap. Removed pt's BiPap pt tolerating well Oxygen sat from 100% in Bipap to 92% RA

## 2017-04-06 NOTE — ED Provider Notes (Signed)
[facility] Emergency Department Provider Note  ____________________________________________  Time seen: Approximately 10:25 AM  I have reviewed the triage vital signs and the nursing notes.   HISTORY  Chief Complaint Respiratory Distress   HPI Marco [name] is a 62 y.o. male with a history of smoking, HTN, and seizure ds who was recently discharged from the hospital 5 days ago after being admittedHCAP/ ARDS who presents for evaluation of shortness of breath. Patient reports that he never felt better when he went home. Started feeling worse over the last 24 hours. patient continues to have cough and fever at home. No vomiting or diarrhea. He is complaining of severe constant shortness of breath. No chest pain, no abdominal pain. He endorses compliance with his antibiotics at home.patient was hypoxic to 85% per EMS.patient was given 2 albuterol treatments, one duoneb, and 125 mg of Solu-Medrol per EMS  Past Medical History:  Diagnosis Date  . Anxiety   . Arthritis   . Depression   . Heartburn   . Seizures [facility])     Patient Active Problem List   Diagnosis Date Noted  . Acute on chronic respiratory failure with hypoxia (HCC) 04/06/2017  . HCAP (healthcare-associated pneumonia) 03/26/2017  . Malnutrition of moderate degree 03/22/2017  . Sepsis (HCC) 03/21/2017  . Hyponatremia 03/12/2017  . Acute encephalopathy 03/04/2017    Past Surgical History:  Procedure Laterality Date  . KNEE ARTHROSCOPY  1975    Prior to Admission medications   Medication Sig Start Date End Date Taking? Authorizing Provider  azithromycin (ZITHROMAX) 250 MG tablet Take 250 mg by mouth daily.   Yes [provider]  cefTRIAXone (ROCEPHIN) IVPB Inject 1 g into the vein daily. 04/05/17 04/07/17 Yes [provider]  cyanocobalamin 2000 MCG tablet Take 1 tablet (2,000 mcg total) by mouth daily. 03/25/17  Yes Katha Hamming, MD  ipratropium-albuterol (DUONEB)  0.5-2.5 (3) MG/3ML SOLN Take 3 mLs by nebulization every 6 (six) hours as needed (FOR PNEUMONIA SYMPTOMS).   Yes [provider]  levETIRAcetam (KEPPRA) 750 MG tablet Take 1 tablet (750 mg total) by mouth 2 (two) times daily. 03/24/17  Yes Katha Hamming, MD  metoprolol tartrate (LOPRESSOR) 25 MG tablet Take 0.5 tablets (12.5 mg total) by mouth 2 (two) times daily. 04/01/17  Yes Shaune Pollack, MD  Multiple Vitamin (MULTIVITAMIN WITH MINERALS) TABS tablet Take 1 tablet by mouth daily. 04/01/17  Yes Shaune Pollack, MD  nicotine (NICODERM CQ - DOSED IN MG/24 HOURS) 21 mg/24hr patch Place 1 patch (21 mg total) onto the skin daily. 03/25/17  Yes Mody, Patricia Pesa, MD  pantoprazole (PROTONIX) 40 MG tablet Take 1 tablet (40 mg total) by mouth daily. 03/15/17  Yes Enid Baas, MD  tamsulosin (FLOMAX) 0.4 MG CAPS capsule Take 1 capsule (0.4 mg total) by mouth daily. 03/25/17  Yes Katha Hamming, MD  thiamine 100 MG tablet Take 1 tablet (100 mg total) by mouth daily. 04/01/17  Yes Shaune Pollack, MD  tuberculin (TUBERSOL) 5 UNIT/0.1ML injection Inject 0.1 mLs into the skin every 7 (seven) days. 04/01/17 04/15/17 Yes [provider]  albuterol (PROVENTIL HFA;VENTOLIN HFA) 108 (90 Base) MCG/ACT inhaler Inhale 2 puffs into the lungs every 6 (six) hours as needed for wheezing or shortness of breath. 03/24/17   Katha Hamming, MD  feeding supplement, ENSURE ENLIVE, (ENSURE ENLIVE) LIQD Take 237 mLs by mouth 2 (two) times daily between meals. 03/24/17   Katha Hamming, MD  mometasone-formoterol (DULERA) 200-5 MCG/ACT AERO Inhale 2 puffs into the  lungs 2 (two) times daily. Patient taking differently: Inhale 2 puffs into the lungs 2 (two) times daily.  03/14/17   Enid Baas, MD    Allergies Hydrocodone-acetaminophen  Family History  Problem Relation Age of Onset  . Diabetes Mellitus II Mother   . Breast cancer Mother   . Kidney disease Neg Hx   . Prostate cancer Neg Hx     Social  History Social History   Tobacco Use  . Smoking status: Current Every Day Smoker    Packs/day: 1.00  . Smokeless tobacco: Never Used  Substance Use Topics  . Alcohol use: Yes    Alcohol/week: 2.4 oz    Types: 4 Cans of beer per week  . Drug use: No    Review of Systems  Constitutional: + fever. Eyes: Negative for visual changes. ENT: Negative for sore throat. Neck: No neck pain  Cardiovascular: Negative for chest pain. Respiratory: + shortness of breath, cough Gastrointestinal: Negative for abdominal pain, vomiting or diarrhea. Genitourinary: Negative for dysuria. Musculoskeletal: Negative for back pain. Skin: Negative for rash. Neurological: Negative for headaches, weakness or numbness. Psych: No SI or HI  ____________________________________________   PHYSICAL EXAM:  VITAL SIGNS: ED Triage Vitals  Enc Vitals Group     BP 04/06/17 1020 114/71     Pulse Rate 04/06/17 1020 (!) 130     Resp 04/06/17 1020 (!) 29     Temp 04/06/17 1020 98.3 F (36.8 C)     Temp Source 04/06/17 1020 Axillary     SpO2 04/06/17 1020 100 %     Weight 04/06/17 1023 151 lb (68.5 kg)     Height 04/06/17 1023 5\' 6"  (1.676 m)     Head Circumference --      Peak Flow --      Pain Score 04/06/17 1022 0     Pain Loc --      Pain Edu? --      Excl. in GC? --     Constitutional: Alert and oriented. moderate respiratory distress HEENT:      Head: Normocephalic and atraumatic.         Eyes: Conjunctivae are normal. Sclera is non-icteric.       Mouth/Throat: Mucous membranes are moist.       Neck: Supple with no signs of meningismus. Cardiovascular: Tachycardic with regular rhythm. No murmurs, gallops, or rubs. 2+ symmetrical distal pulses are present in all extremities. No JVD. Respiratory: patient is tachypneic to the low 30s, hypoxic, severely decreased air movement and rhonchi Gastrointestinal: Soft, non tender, and non distended with positive bowel sounds. No rebound or  guarding. Musculoskeletal: Nontender with normal range of motion in all extremities. No edema, cyanosis, or erythema of extremities. Neurologic: Normal speech and language. Face is symmetric. Moving all extremities. No gross focal neurologic deficits are appreciated. Skin: Skin is warm, dry and intact. No rash noted. Psychiatric: Mood and affect are normal. Speech and behavior are normal.  ____________________________________________   LABS (all labs ordered are listed, but only abnormal results are displayed)  Labs Reviewed  COMPREHENSIVE METABOLIC PANEL - Abnormal; Notable for the following components:      Result Value   Potassium 3.4 (*)    Glucose, Bld 116 (*)    Calcium 8.4 (*)    Total Protein 6.2 (*)    Albumin 2.5 (*)    Total Bilirubin 1.5 (*)    All other components within normal limits  CBC WITH DIFFERENTIAL/PLATELET - Abnormal; Notable for the  following components:   WBC 13.2 (*)    RBC 2.52 (*)    Hemoglobin 8.4 (*)    HCT 25.5 (*)    MCV 101.3 (*)    RDW 15.0 (*)    Neutro Abs 11.5 (*)    Lymphs Abs 0.8 (*)    All other components within normal limits  LACTIC ACID, PLASMA - Abnormal; Notable for the following components:   Lactic Acid, Venous 2.6 (*)    All other components within normal limits  BLOOD GAS, VENOUS - Abnormal; Notable for the following components:   pCO2, Ven 42 (*)    All other components within normal limits  CULTURE, BLOOD (ROUTINE X 2)  CULTURE, BLOOD (ROUTINE X 2)  CULTURE, BLOOD (ROUTINE X 2)  CULTURE, BLOOD (ROUTINE X 2)  LACTIC ACID, PLASMA  INFLUENZA PANEL BY PCR (TYPE A & B)  PROCALCITONIN  URINALYSIS, ROUTINE W REFLEX MICROSCOPIC  CBC WITH DIFFERENTIAL/PLATELET  URINALYSIS, ROUTINE W REFLEX MICROSCOPIC  I-STAT CG4 LACTIC ACID, ED  I-STAT CG4 LACTIC ACID, ED   ____________________________________________  EKG  ED ECG REPORT I, Nita Sicklearolina Ladelle Teodoro, the attending physician, personally viewed and interpreted this ECG.  Sinus  tachycardia, rate of 127, normal intervals, normal axis, no ST elevations or depressions, T-wave flattening in lateral leads.unchanged from prior from 4 days ago  ____________________________________________  RADIOLOGY  I have personally reviewed the images performed during this visit and I agree with the Radiologist's read.   Interpretation by Radiologist:  Dg Chest Port 1 View  Result Date: 04/06/2017 CLINICAL DATA:  Shortness of Breath EXAM: PORTABLE CHEST 1 VIEW COMPARISON:  03/28/2017 FINDINGS: Cardiac shadow is stable. Lungs are well aerated bilaterally with persistent interstitial and alveolar opacities stable from the previous study. No sizable effusion is seen. No pneumothorax is noted. No bony abnormality is noted IMPRESSION: Stable appearance of the chest when compare with the prior study. Electronically Signed   By: Alcide CleverMark  Lukens M.D.   On: 04/06/2017 10:55     ____________________________________________   PROCEDURES  Procedure(s) performed: None Procedures Critical Care performed: yes  CRITICAL CARE Performed by: Nita Sicklearolina Markasia Carrol  ?  Total critical care time: 35 min  Critical care time was exclusive of separately billable procedures and treating other patients.  Critical care was necessary to treat or prevent imminent or life-threatening deterioration.  Critical care was time spent personally by me on the following activities: development of treatment plan with patient and/or surrogate as well as nursing, discussions with consultants, evaluation of patient's response to treatment, examination of patient, obtaining history from patient or surrogate, ordering and performing treatments and interventions, ordering and review of laboratory studies, ordering and review of radiographic studies, pulse oximetry and re-evaluation of patient's condition.  ____________________________________________   INITIAL IMPRESSION / ASSESSMENT AND PLAN / ED COURSE   62 y.o. male with  a history of smoking, HTN, and seizure ds who was recently discharged from the hospital 5 days ago after being admittedHCAP/ ARDS who presents for evaluation of shortness of breath, fever, hypoxia. Patient was hypoxic to 85% per EMS, has coarse rhonchi and decreased air movement, hypoxia, tachypnea. Patient was started on BiPAP.patient is afebrile however due to tachycardia and tachypnea he does meet sepsis criteria. Patient was started on 3 DuoNeb labs, IV fluids, IV cefepime and vancomycin. Labs, CXR, flu pending. Will be admitted to Hospitalist for acute hypoxic respiratory failure, HCAP, and sepsis.     As part of my medical decision making, I reviewed the following data  within the electronic MEDICAL RECORD NUMBER Nursing notes reviewed and incorporated, Labs reviewed , EKG interpreted , Old EKG reviewed, Old chart reviewed, Radiograph reviewed , Discussed with admitting physician , Notes from prior ED visits and [place] Controlled Substance Database    Pertinent labs & imaging results that were available during my care of the patient were reviewed by me and considered in my medical decision making (see chart for details).    ____________________________________________   FINAL CLINICAL IMPRESSION(S) / ED DIAGNOSES  Final diagnoses:  Acute respiratory failure with hypoxia (HCC)  HCAP (healthcare-associated pneumonia)  Sepsis, due to unspecified organism [facility])      NEW MEDICATIONS STARTED DURING THIS VISIT:  ED Discharge Orders    None       Note:  This document was prepared using Dragon voice recognition software and may include unintentional dictation errors.    Don Perking, [place], MD 04/06/17 [phone]

## 2017-04-06 NOTE — ED Notes (Signed)
Discussed with Dr. Allena KatzPatel pt on 2Ln/[place] oxygen saturation 95%

## 2017-04-06 NOTE — ED Notes (Addendum)
[place], [place]. In report, Corrin ParkerSilvia RN states that floor unable to take patient at this time due to staffing issues-informed from [place] Creek[place]ie, Forensic scientistN charge nurse on assigned floor. Will attempt to call report again.

## 2017-04-06 NOTE — ED Notes (Signed)
Report to Emily, RN

## 2017-04-06 NOTE — ED Notes (Signed)
Pt's wife at bedside.

## 2017-04-06 NOTE — H&P (Signed)
[facility] - [place] [facility]   PATIENT NAME: Marco [name]    MR#:  [id number]  DATE OF BIRTH:  03-25-1955  DATE OF ADMISSION:  04/06/2017  PRIMARY CARE PHYSICIAN: Jerl Mina, MD   REQUESTING/REFERRING PHYSICIAN: Dr Mayford Knife  CHIEF COMPLAINT:  increasing shortness of breath  Pt is a very poor historian. He was sent from [facility] HISTORY OF PRESENT ILLNESS:  Marco [name]  is a 62 y.o. male with a known history of smoking, hypertension, history of seizure disorder, COPD recently admitted with hypoxic respiratory failure remained on BiPAP chest x-rays suggestive of ARDS/pneumonia was discharged to [facility] five days ago comes back with shortness of breath and lethargic. Patient currently is on BiPAP sets 100% he is more awake and able to talk. Patient not able to give much history. He is being admitted for further evaluation and management of acute on chronic hypoxic respiratory failure secondary to COPD exacerbation. Chest x-ray does not show new pneumonia. ER Physician covered patient with broad-spectrum antibiotics.  PAST MEDICAL HISTORY:   Past Medical History:  Diagnosis Date  . Anxiety   . Arthritis   . Depression   . Heartburn   . Seizures (HCC)     PAST SURGICAL HISTOIRY:   Past Surgical History:  Procedure Laterality Date  . KNEE ARTHROSCOPY  1975    SOCIAL HISTORY:   Social History   Tobacco Use  . Smoking status: Current Every Day Smoker    Packs/day: 1.00  . Smokeless tobacco: Never Used  Substance Use Topics  . Alcohol use: Yes    Alcohol/week: 2.4 oz    Types: 4 Cans of beer per week    FAMILY HISTORY:   Family History  Problem Relation Age of Onset  . Diabetes Mellitus II Mother   . Breast cancer Mother   . Kidney disease Neg Hx   . Prostate cancer Neg Hx     DRUG ALLERGIES:   Allergies  Allergen Reactions  . Hydrocodone-Acetaminophen Nausea Only and Nausea And Vomiting     REVIEW OF SYSTEMS:  Review of Systems  Constitutional: Negative for chills, fever and weight loss.  HENT: Negative for ear discharge, ear pain and nosebleeds.   Eyes: Negative for blurred vision, pain and discharge.  Respiratory: Positive for shortness of breath. Negative for sputum production, wheezing and stridor.   Cardiovascular: Negative for chest pain, palpitations, orthopnea and PND.  Gastrointestinal: Negative for abdominal pain, diarrhea, nausea and vomiting.  Genitourinary: Negative for frequency and urgency.  Musculoskeletal: Negative for back pain and joint pain.  Neurological: Negative for sensory change, speech change, focal weakness and weakness.  Psychiatric/Behavioral: Negative for depression and hallucinations. The patient is not nervous/anxious.      MEDICATIONS AT HOME:   Prior to Admission medications   Medication Sig Start Date End Date Taking? Authorizing Provider  azithromycin (ZITHROMAX) 250 MG tablet Take 250 mg by mouth daily.   Yes [provider]  cefTRIAXone (ROCEPHIN) IVPB Inject 1 g into the vein daily. 04/05/17 04/07/17 Yes [provider]  cyanocobalamin 2000 MCG tablet Take 1 tablet (2,000 mcg total) by mouth daily. 03/25/17  Yes Katha Hamming, MD  ipratropium-albuterol (DUONEB) 0.5-2.5 (3) MG/3ML SOLN Take 3 mLs by nebulization every 6 (six) hours as needed (FOR PNEUMONIA SYMPTOMS).   Yes [provider]  levETIRAcetam (KEPPRA) 750 MG tablet Take 1 tablet (750 mg total) by mouth 2 (two) times daily. 03/24/17  Yes Katha Hamming, MD  metoprolol tartrate (  LOPRESSOR) 25 MG tablet Take 0.5 tablets (12.5 mg total) by mouth 2 (two) times daily. 04/01/17  Yes Shaune Pollack, MD  Multiple Vitamin (MULTIVITAMIN WITH MINERALS) TABS tablet Take 1 tablet by mouth daily. 04/01/17  Yes Shaune Pollack, MD  nicotine (NICODERM CQ - DOSED IN MG/24 HOURS) 21 mg/24hr patch Place 1 patch (21 mg total) onto the skin daily. 03/25/17  Yes Mody, Patricia Pesa,  MD  pantoprazole (PROTONIX) 40 MG tablet Take 1 tablet (40 mg total) by mouth daily. 03/15/17  Yes Enid Baas, MD  tamsulosin (FLOMAX) 0.4 MG CAPS capsule Take 1 capsule (0.4 mg total) by mouth daily. 03/25/17  Yes Katha Hamming, MD  thiamine 100 MG tablet Take 1 tablet (100 mg total) by mouth daily. 04/01/17  Yes Shaune Pollack, MD  tuberculin (TUBERSOL) 5 UNIT/0.1ML injection Inject 0.1 mLs into the skin every 7 (seven) days. 04/01/17 04/15/17 Yes [provider]  albuterol (PROVENTIL HFA;VENTOLIN HFA) 108 (90 Base) MCG/ACT inhaler Inhale 2 puffs into the lungs every 6 (six) hours as needed for wheezing or shortness of breath. 03/24/17   Katha Hamming, MD  feeding supplement, ENSURE ENLIVE, (ENSURE ENLIVE) LIQD Take 237 mLs by mouth 2 (two) times daily between meals. 03/24/17   Katha Hamming, MD  mometasone-formoterol (DULERA) 200-5 MCG/ACT AERO Inhale 2 puffs into the lungs 2 (two) times daily. Patient taking differently: Inhale 2 puffs into the lungs 2 (two) times daily.  03/14/17   Enid Baas, MD      VITAL SIGNS:  Blood pressure 132/75, pulse (!) 115, temperature 99.5 F (37.5 C), temperature source Rectal, resp. rate (!) 28, height 5\' 6"  (1.676 m), weight 68.5 kg (151 lb), SpO2 100 %.  PHYSICAL EXAMINATION:  GENERAL:  62 y.o.-year-old patient lying in the bed with no acute distress. disheveld EYES: Pupils equal, round, reactive to light and accommodation. No scleral icterus. Extraocular muscles intact.  HEENT: Head atraumatic, normocephalic. Oropharynx and nasopharynx clear. BIPAP++ NECK:  Supple, no jugular venous distention. No thyroid enlargement, no tenderness.  LUNGS: Normal breath sounds bilaterally, no wheezing, rales,rhonchi or crepitation. No use of accessory muscles of respiration.  CARDIOVASCULAR: S1, S2 normal. No murmurs, rubs, or gallops.  ABDOMEN: Soft, nontender, nondistended. Bowel sounds present. No organomegaly or mass.  EXTREMITIES:  No pedal edema, cyanosis, or clubbing.  NEUROLOGIC: Cranial nerves II through XII are intact. Muscle strength 5/5 in all extremities. Sensation intact. Gait not checked.  PSYCHIATRIC: The patient is alert   SKIN: No obvious rash, lesion, or ulcer.   LABORATORY PANEL:   CBC Recent Labs  Lab 04/06/17 1022  WBC 13.2*  HGB 8.4*  HCT 25.5*  PLT 255   ------------------------------------------------------------------------------------------------------------------  Chemistries  Recent Labs  Lab 04/01/17 0849 04/06/17 1022  NA 145 139  K 4.0 3.4*  CL 101 107  CO2 36* 23  GLUCOSE 110* 116*  BUN 35* 11  CREATININE 0.71 0.63  CALCIUM 9.1 8.4*  MG 2.1  --   AST  --  31  ALT  --  17  ALKPHOS  --  91  BILITOT  --  1.5*   ------------------------------------------------------------------------------------------------------------------  Cardiac Enzymes No results for input(s): TROPONINI in the last 168 hours. ------------------------------------------------------------------------------------------------------------------  RADIOLOGY:  Dg Chest Port 1 View  Result Date: 04/06/2017 CLINICAL DATA:  Shortness of Breath EXAM: PORTABLE CHEST 1 VIEW COMPARISON:  03/28/2017 FINDINGS: Cardiac shadow is stable. Lungs are well aerated bilaterally with persistent interstitial and alveolar opacities stable from the previous study. No sizable effusion is seen. No  pneumothorax is noted. No bony abnormality is noted IMPRESSION: Stable appearance of the chest when compare with the prior study. Electronically Signed   By: Alcide CleverMark  Lukens M.D.   On: 04/06/2017 10:55    EKG:    IMPRESSION AND PLAN:    Marco [name]  is a 62 y.o. male with a known history of smoking, hypertension, history of seizure disorder, COPD recently admitted with hypoxic respiratory failure remained on BiPAP chest x-rays suggestive of ARDS/pneumonia was [facility] five days ago comes back with shortness  of breath and lethargic.  1. acute on chronic respiratory failure with hypoxia secondary to COPD exacerbation -has long-standing history of tobacco abuse -working on BiPAP sets 100% will try to wean him off to nasal cannula oxygen -the Solu-Medrol, inhaler, nebulizer -Pro calcitonin ordered -hold off antibiotics for now. Patient already got broad-spectrum antibiotic in the emergency room -just finished a 10 day course of antibiotic  2. hypertension continue home meds  3. Three of seizure disorder continue Keppra  4. DVTprophylaxis subcu Lovenox  All the records are reviewed and case discussed with ED provider. Management plans discussed with the patient, family and they are in agreement.  CODE STATUS: FULL  TOTAL critical TIME TAKING CARE OF THIS PATIENT: 50 minutes.    Enedina FinnerSona Zriyah Kopplin M.D on 04/06/2017 at 12:54 PM  Between 7am to 6pm - Pager - [phone]  After 6pm go to [email or web address] - password [email or web address] [facility]  [facility]  Office  [phone]  CC: Primary care physician; Jerl MinaHedrick, James, MD

## 2017-04-06 NOTE — Clinical Social Work Note (Signed)
Clinical Social Work Assessment  Patient Details  Name: Marco [name] MRN: [id number] Date of Birth: 1955-10-11  Date of referral:  04/06/17               Reason for consult:  Discharge Planning                Permission sought to share information with:  Family Supports, Magazine features editoracility Contact Representative Permission granted to share information::  Yes, Verbal Permission Granted  Name::     Spouse Marco [name] [phone]  Agency::     Relationship::     Contact Information:     Housing/Transportation Living arrangements for the past 2 months:  Skilled Nursing Facility Source of Information:  Patient Patient Interpreter Needed:  None Criminal Activity/Legal Involvement Pertinent to Current Situation/Hospitalization:  No - Comment as needed Significant Relationships:  Spouse Lives with:  Spouse Do you feel safe going back to the place where you live?  Yes Need for family participation in patient care:  Yes (Comment)  Care giving concerns:  None   Social Worker assessment / plan: LCSW introduced myself to patient and obtained verbal consent to speak to spouse Marco Hashimotoatricia. She had stepped out. Patient was able to answer questions and reports he just has been feeling rough last little while. He reports [facility] has excellent [facility], he reports he feels weak but still can walk, he reports he needs some assistance at times but is independent with his ADLs, he does not use a walker or wheelchair and stated he should. He is continent. He is not on a special diet. He was on 02 in ed but reports he doesn't usually need it. Patient has medicare. Patient was in good spirits and did not need anything further.  Employment status:  Disabled (Comment on whether or not currently receiving Disability) Insurance information:  Medicare PT Recommendations:  Skilled Nursing Facility Information / Referral to community resources:  Skilled Nursing  Facility  Patient/Family's Response to care:  Good  Patient/Family's Understanding of and Emotional Response to Diagnosis, Current Treatment, and Prognosis:  Patient understands he is feeling not good at this time  Emotional Assessment Appearance:  Appears older than stated age Attitude/Demeanor/Rapport:  Engaged Affect (typically observed):  Calm Orientation:  Oriented to Self, Oriented to Place, Oriented to  Time, Oriented to Situation Alcohol / Substance use:  Not Applicable Psych involvement (Current and /or in the community):  No (Comment)  Discharge Needs  Concerns to be addressed:   none Readmission within the last 30 days:  No Current discharge risk:  None Barriers to Discharge:  No Barriers Identified   Marco [name], Marco Rao M, LCSW 04/06/2017, 3:35 PM

## 2017-04-06 NOTE — ED Notes (Signed)
Date and time results received: 04/06/17 1400 (use smartphrase ".now" to insert current time)  Test: Lactid Acid  Critical Value: 2.6  Name of Provider Notified: Dr. Allena KatzPatel   Orders Received? Or Actions Taken?: Actions Taken: Dr. Allena KatzPatel Acknowledged report no new orders received

## 2017-04-06 NOTE — Progress Notes (Signed)
CODE SEPSIS - PHARMACY COMMUNICATION  **Broad Spectrum Antibiotics should be administered within 1 hour of Sepsis diagnosis**  Time Code Sepsis Called/Page Received: 4/6 @1032   Antibiotics Ordered: cefepime 2g IV x 1 dose                                     Vancomycin 1000mg  x 1 dose   Time of 1st antibiotic administration: Cefepime -4/6 @ 1049  Additional action taken by pharmacy: NA  If necessary, Name of Provider/Nurse Contacted: NA  Gardner CandleSheema M Kaitrin Seybold, PharmD, BCPS Clinical Pharmacist 04/06/2017 11:04 AM

## 2017-04-06 NOTE — Progress Notes (Signed)
Pharmacy Antibiotic Note  Marco RegisterMichael [name] is a 62 y.o. male admitted on 04/06/2017 with respiratory failure.  Pharmacy has been consulted for vancomycin and cefepime dosing.  PCT < 0.1 initially   Plan: Cefepime 2 g iv q 8 hours.   Vancomycin 1000 mg iv q 12 hours. F/U MRSA PCR.   Height: 5\' 6"  (167.6 cm) Weight: 151 lb (68.5 kg) IBW/kg (Calculated) : 63.8  Temp (24hrs), Avg:98.4 F (36.9 C), Min:97.8 F (36.6 C), Max:99.5 F (37.5 C)  Recent Labs  Lab 04/01/17 0849 04/06/17 1022 04/06/17 1305  WBC  --  13.2*  --   CREATININE 0.71 0.63  --   LATICACIDVEN  --  1.5 2.6*    Estimated Creatinine Clearance: 87.5 mL/min (by C-G formula based on SCr of 0.63 mg/dL).    Allergies  Allergen Reactions  . Hydrocodone-Acetaminophen Nausea Only and Nausea And Vomiting    Antimicrobials this admission: cefepime 4/6 >>  vancomycin 4/6 >>   Dose adjustments this admission:   Microbiology results: 4/6 BCx: sent MRSA PCR: sent  Thank you for allowing pharmacy to be a part of this patient's care.  Marco HartChristy, Marco [name] D 04/06/2017 7:22 PM

## 2017-04-06 NOTE — Progress Notes (Signed)
Pt transported to CCU 14 on Bipap with no adverse effects.

## 2017-04-07 ENCOUNTER — Inpatient Hospital Stay: Payer: Medicare Other

## 2017-04-07 DIAGNOSIS — J9601 Acute respiratory failure with hypoxia: Secondary | ICD-10-CM

## 2017-04-07 LAB — BASIC METABOLIC PANEL
Anion gap: 3 — ABNORMAL LOW (ref 5–15)
BUN: 14 mg/dL (ref 6–20)
CHLORIDE: 114 mmol/L — AB (ref 101–111)
CO2: 25 mmol/L (ref 22–32)
Calcium: 8.4 mg/dL — ABNORMAL LOW (ref 8.9–10.3)
Creatinine, Ser: 0.6 mg/dL — ABNORMAL LOW (ref 0.61–1.24)
GFR calc Af Amer: >60 mL/min (ref >60)
GFR calc non Af Amer: >60 mL/min (ref >60)
GLUCOSE: 138 mg/dL — AB (ref 65–99)
Potassium: 4.4 mmol/L (ref 3.5–5.1)
SODIUM: 142 mmol/L (ref 135–145)

## 2017-04-07 LAB — IRON AND TIBC
IRON: 22 ug/dL — AB (ref 45–182)
Saturation Ratios: 11 % — ABNORMAL LOW (ref 17.9–39.5)
TIBC: 203 ug/dL — ABNORMAL LOW (ref 250–450)
UIBC: 181 ug/dL

## 2017-04-07 LAB — CBC
HCT: 23.2 % — ABNORMAL LOW (ref 40.0–52.0)
Hemoglobin: 7.6 g/dL — ABNORMAL LOW (ref 13.0–18.0)
MCH: 33.3 pg (ref 26.0–34.0)
MCHC: 32.9 g/dL (ref 32.0–36.0)
MCV: 101.3 fL — AB (ref 80.0–100.0)
Platelets: 235 10*3/uL (ref 150–440)
RBC: 2.29 MIL/uL — ABNORMAL LOW (ref 4.40–5.90)
RDW: 15.4 % — AB (ref 11.5–14.5)
WBC: 14.5 10*3/uL — AB (ref 3.8–10.6)

## 2017-04-07 LAB — BLOOD GAS, VENOUS
Acid-Base Excess: 0.3 mmol/L (ref 0.0–2.0)
Bicarbonate: 25.4 mmol/L (ref 20.0–28.0)
O2 SAT: 64.8 %
PCO2 VEN: 42 mmHg — AB (ref 44.0–60.0)
PH VEN: 7.39 (ref 7.250–7.430)
PO2 VEN: 34 mmHg (ref 32.0–45.0)
Patient temperature: 37

## 2017-04-07 LAB — VITAMIN B12: VITAMIN B 12: 391 pg/mL (ref 180–914)

## 2017-04-07 LAB — URINALYSIS, ROUTINE W REFLEX MICROSCOPIC
BILIRUBIN URINE: NEGATIVE
GLUCOSE, UA: NEGATIVE mg/dL
Hgb urine dipstick: NEGATIVE
KETONES UR: NEGATIVE mg/dL
LEUKOCYTES UA: NEGATIVE
NITRITE: NEGATIVE
PROTEIN: NEGATIVE mg/dL
Specific Gravity, Urine: 1.011 (ref 1.005–1.030)
pH: 5 (ref 5.0–8.0)

## 2017-04-07 LAB — PROCALCITONIN: Procalcitonin: <0.1 ng/mL

## 2017-04-07 LAB — RETICULOCYTES
RBC.: 2.35 MIL/uL — ABNORMAL LOW (ref 4.40–5.90)
RETIC COUNT ABSOLUTE: 75.2 10*3/uL (ref 19.0–183.0)
Retic Ct Pct: 3.2 % — ABNORMAL HIGH (ref 0.4–3.1)

## 2017-04-07 LAB — FERRITIN: FERRITIN: 354 ng/mL — AB (ref 24–336)

## 2017-04-07 LAB — SEDIMENTATION RATE: SED RATE: 129 mm/h — AB (ref 0–20)

## 2017-04-07 LAB — FOLATE: Folate: 8.7 ng/mL (ref >5.9)

## 2017-04-07 MED ORDER — FERROUS SULFATE 325 (65 FE) MG PO TABS
325.0000 mg | ORAL_TABLET | Freq: Two times a day (BID) | ORAL | Status: DC
Start: 2017-04-07 — End: 2017-04-16
  Administered 2017-04-07 – 2017-04-16 (×19): 325 mg via ORAL
  Filled 2017-04-07 (×19): qty 1

## 2017-04-07 MED ORDER — FUROSEMIDE 10 MG/ML IJ SOLN
20.0000 mg | Freq: Two times a day (BID) | INTRAMUSCULAR | Status: DC
  Administered 2017-04-07 – 2017-04-08 (×3): 20 mg via INTRAVENOUS
  Filled 2017-04-07 (×3): qty 2

## 2017-04-07 MED ORDER — POLYETHYLENE GLYCOL 3350 17 G PO PACK
17.0000 g | PACK | Freq: Every day | ORAL | Status: DC
  Administered 2017-04-07 – 2017-04-16 (×7): 17 g via ORAL
  Filled 2017-04-07 (×10): qty 1

## 2017-04-07 MED ORDER — POTASSIUM CHLORIDE CRYS ER 20 MEQ PO TBCR
40.0000 meq | EXTENDED_RELEASE_TABLET | Freq: Once | ORAL | Status: AC
  Administered 2017-04-07: 40 meq via ORAL
  Filled 2017-04-07: qty 2

## 2017-04-07 MED ORDER — LEVOFLOXACIN IN D5W 750 MG/150ML IV SOLN
750.0000 mg | INTRAVENOUS | Status: DC
  Administered 2017-04-07: 750 mg via INTRAVENOUS
  Filled 2017-04-07 (×2): qty 150

## 2017-04-07 NOTE — Progress Notes (Signed)
Pt resting in bed this shift, per wife [facility]  Writer spoke with Dr Lonn Georgiaonforti and placed order for PT here.  SCDs applied, pt is very weak and moans nearly continually.  When writer inquires what is wrong he says" I'm old."  Currently with 5L .  Took meds crushed with applesauce this AM.  He is unable to state date, doesn't remember why he came to ED.  Will state he is in [facility]  His brother repeatedly asked patient this AM to state brother's name.  Pt appeared unable to do so, just repeatedly said "You're my brother."

## 2017-04-07 NOTE — Progress Notes (Signed)
Pharmacy Antibiotic Note  Marco RegisterMichael [name] is a 62 y.o. male admitted on 04/06/2017 with respiratory failure.  Pharmacy has been consulted for vancomycin and cefepime dosing.  PCT < 0.1 initially   Plan: Cefepime 2 g iv q 8 hours.   Vancomycin 1000 mg iv q 12 hours. F/U MRSA PCR.   Height: 5\' 6"  (167.6 cm) Weight: 151 lb (68.5 kg) IBW/kg (Calculated) : 63.8  Temp (24hrs), Avg:98.4 F (36.9 C), Min:97.8 F (36.6 C), Max:99.5 F (37.5 C)  Recent Labs  Lab 04/01/17 0849 04/06/17 1022 04/06/17 1305 04/06/17 2008 04/06/17 2226 04/06/17 2229 04/07/17 0534  WBC  --  13.2*  --   --   --  13.1* 14.5*  CREATININE 0.71 0.63  --   --   --  0.55* 0.60*  LATICACIDVEN  --  1.5 2.6* 1.9 1.0  --   --     Estimated Creatinine Clearance: 87.5 mL/min (A) (by C-G formula based on SCr of 0.6 mg/dL (L)).    Allergies  Allergen Reactions  . Hydrocodone-Acetaminophen Nausea Only and Nausea And Vomiting    Antimicrobials this admission: cefepime 4/6 >> Levaquin 4/7 vancomycin 4/6 >> 4/6 dc  Dose adjustments this admission:   Microbiology results: 4/6 BCx: sent MRSA PCR: sent  Thank you for allowing pharmacy to be a part of this patient'[name] care.  Marco [name] 04/07/2017 7:29 AM

## 2017-04-07 NOTE — Progress Notes (Signed)
CH follow-up with patient and wife. Patient stated that he would like an HCPOA. CH provided education to patient and wife. Wife stated that she wanted patient to have HCPOA because [facility] would not listen to her without it. Patient stated that it is time for him to get one as he is not doing too good. Patient and wife married Dec. 31, 2018. CH provided education, emotional support, and ministry of presence.

## 2017-04-07 NOTE — Progress Notes (Signed)
Pt taken off bipap and placed on HFNC, pt states that feels better, respiratory rate in the 20's, HR 90, sats 95%, will continue to monitor

## 2017-04-07 NOTE — Progress Notes (Signed)
CH received order requisition for patient consult for HCPOA. CH went to room on order and patient had been moved to another room. CH will follow-up with patient in new room.

## 2017-04-07 NOTE — Progress Notes (Signed)
Patient was transfer from 1A for bipap. Has been on bipap 50%. Has periods of confusion, mostly disoriented to where he is and why he came to the floor.  No pain or discomfort. Continue to monitor.

## 2017-04-07 NOTE — Consult Note (Signed)
PULMONARY / CRITICAL CARE MEDICINE   Name: Marco [name] MRN: [id number] DOB: [birth year]    ADMISSION DATE:  04/06/2017   CONSULTATION DATE:  04/06/2017  REFERRING MD:  Ramonita Lab, MD  Reason: Acute hypoxic respiratory failure  HISTORY OF PRESENT ILLNESS:   This is a 62 year old Caucasian male with a past medical history as indicated below, recent hospitalization for HCAP, acute hypoxic respiratory failure, ARDS and sepsis who is readmitted with similar symptoms.  Patient presented to the ED via EMS from [facility] health care with complaints of respiratory distress and fever.  Per his brother, symptoms started about 2 days ago and gradually got worse.  When EMS arrived, patient was hypoxic with SPO2 of 85%.  He was given bronchodilators and systemic steroids and transferred to the ED on supplemental oxygen.  A code sepsis was called upon ED presentation because presentation was tachycardic, had a mildly elevated white count and diffuse opacities on chest x-ray.  He was started on broad-spectrum antibiotics and admitted to the MedSurg unit.  He is being transferred to the ICU for worsening respiratory failure requiring BiPAP.  PAST MEDICAL HISTORY :  He  has a past medical history of Anxiety, Arthritis, Depression, Heartburn, and Seizures (HCC).  PAST SURGICAL HISTORY: He  has a past surgical history that includes Knee arthroscopy (1975).  Allergies  Allergen Reactions  . Hydrocodone-Acetaminophen Nausea Only and Nausea And Vomiting    No current facility-administered medications on file prior to encounter.    Current Outpatient Medications on File Prior to Encounter  Medication Sig  . azithromycin (ZITHROMAX) 250 MG tablet Take 250 mg by mouth daily.  . cefTRIAXone (ROCEPHIN) IVPB Inject 1 g into the vein daily.  . cyanocobalamin 2000 MCG tablet Take 1 tablet (2,000 mcg total) by mouth daily.  Marland Kitchen ipratropium-albuterol (DUONEB) 0.5-2.5 (3) MG/3ML SOLN Take 3 mLs by nebulization every 6  (six) hours as needed (FOR PNEUMONIA SYMPTOMS).  Marland Kitchen levETIRAcetam (KEPPRA) 750 MG tablet Take 1 tablet (750 mg total) by mouth 2 (two) times daily.  . metoprolol tartrate (LOPRESSOR) 25 MG tablet Take 0.5 tablets (12.5 mg total) by mouth 2 (two) times daily.  . Multiple Vitamin (MULTIVITAMIN WITH MINERALS) TABS tablet Take 1 tablet by mouth daily.  . nicotine (NICODERM CQ - DOSED IN MG/24 HOURS) 21 mg/24hr patch Place 1 patch (21 mg total) onto the skin daily.  . pantoprazole (PROTONIX) 40 MG tablet Take 1 tablet (40 mg total) by mouth daily.  . tamsulosin (FLOMAX) 0.4 MG CAPS capsule Take 1 capsule (0.4 mg total) by mouth daily.  Marland Kitchen thiamine 100 MG tablet Take 1 tablet (100 mg total) by mouth daily.  Marland Kitchen tuberculin (TUBERSOL) 5 UNIT/0.1ML injection Inject 0.1 mLs into the skin every 7 (seven) days.  Marland Kitchen albuterol (PROVENTIL HFA;VENTOLIN HFA) 108 (90 Base) MCG/ACT inhaler Inhale 2 puffs into the lungs every 6 (six) hours as needed for wheezing or shortness of breath.  . feeding supplement, ENSURE ENLIVE, (ENSURE ENLIVE) LIQD Take 237 mLs by mouth 2 (two) times daily between meals.  . mometasone-formoterol (DULERA) 200-5 MCG/ACT AERO Inhale 2 puffs into the lungs 2 (two) times daily. (Patient taking differently: Inhale 2 puffs into the lungs 2 (two) times daily. )    FAMILY HISTORY:  His indicated that his mother is alive. He indicated that his father is deceased. He indicated that the status of his neg hx is unknown.   SOCIAL HISTORY: He  reports that he has been smoking.  He has been smoking  about 1.00 pack per day. He has never used smokeless tobacco. He reports that he drinks about 2.4 oz of alcohol per week. He reports that he does not use drugs.  REVIEW OF SYSTEMS:   Unable to obtain as patient is on continuous BiPAP  SUBJECTIVE:   VITAL SIGNS: BP 116/79   Pulse 91   Temp 98.6 F (37 C) (Axillary)   Resp 20   Ht 5\' 6"  (1.676 m)   Wt 151 lb (68.5 kg)   SpO2 98%   BMI 24.37 kg/m    HEMODYNAMICS:    VENTILATOR SETTINGS: FiO2 (%):  [37 %-40 %] 40 %  INTAKE / OUTPUT: I/O last 3 completed shifts: In: 1340 [IV Piggyback:1340] Out: -   PHYSICAL EXAMINATION: General: Awake, in moderate respiratory distress Neuro: Alert and oriented x3, no focal deficits HEENT: PERRLA, trachea midline, BiPAP mask in place Cardiovascular: Apical pulse tachycardic, regular, S1-S2, no murmur regurg or gallop Lungs: Increased work of breathing, bilateral breath sounds diminished in all lung fields, bibasilar crackles and diffuse rhonchi in anterior lung fields Abdomen: Normal bowel sounds in all 4 quadrants, no organomegaly on palpation Musculoskeletal: Deformities Skin: Warm and dry  LABS:  BMET Recent Labs  Lab 04/06/17 1022 04/06/17 2229 04/07/17 0534  NA 139 142 142  K 3.4* 3.7 4.4  CL 107 113* 114*  CO2 23 23 25   BUN 11 13 14   CREATININE 0.63 0.55* 0.60*  GLUCOSE 116* 168* 138*    Electrolytes Recent Labs  Lab 04/01/17 0849 04/06/17 1022 04/06/17 2229 04/07/17 0534  CALCIUM 9.1 8.4* 8.3* 8.4*  MG 2.1  --  2.2  --   PHOS  --   --  3.7  --     CBC Recent Labs  Lab 04/06/17 1022 04/06/17 2229 04/07/17 0534  WBC 13.2* 13.1* 14.5*  HGB 8.4* 8.0* 7.6*  HCT 25.5* 24.2* 23.2*  PLT 255 240 235    Coag's Recent Labs  Lab 04/06/17 2229  INR 1.15    Sepsis Markers Recent Labs  Lab 04/06/17 1022 04/06/17 1305 04/06/17 2008 04/06/17 2226  LATICACIDVEN 1.5 2.6* 1.9 1.0  PROCALCITON <0.10  --   --   --     ABG Recent Labs  Lab 04/06/17 1749 04/06/17 2229  PHART 7.48* 7.50*  PCO2ART 30* 30*  PO2ART 69* 66*    Liver Enzymes Recent Labs  Lab 04/06/17 1022 04/06/17 2229  AST 31 18  ALT 17 15*  ALKPHOS 91 93  BILITOT 1.5* 1.0  ALBUMIN 2.5* 2.5*    Cardiac Enzymes Recent Labs  Lab 04/06/17 2229  TROPONINI <0.03    Glucose Recent Labs  Lab 04/06/17 2120  GLUCAP 146*    Imaging Dg Chest Port 1 View  Result Date:  04/06/2017 CLINICAL DATA:  Shortness of Breath EXAM: PORTABLE CHEST 1 VIEW COMPARISON:  03/28/2017 FINDINGS: Cardiac shadow is stable. Lungs are well aerated bilaterally with persistent interstitial and alveolar opacities stable from the previous study. No sizable effusion is seen. No pneumothorax is noted. No bony abnormality is noted IMPRESSION: Stable appearance of the chest when compare with the prior study. Electronically Signed   By: Alcide Clever M.D.   On: 04/06/2017 10:55    STUDIES:  Echo 03/26/2017 Left ventricular ejection fraction of 50% The right ventricular systolic pressure was increased consistent   with mild pulmonary hypertension. Borderline left ventricular   systolic dysfunction with grade 1 distlic dysfunction, mild MR/TR   and small anterior pericardial effusion without hemodynamic  compromise.  CULTURES: Blood cultures x2 Sputum culture if patient can expectorate  ANTIBIOTICS: Discontinue vancomycin and cefepime Start Levaquin  SIGNIFICANT EVENTS: 4/06: Admitted and later transferred to the ICU for worsening respiratory status  LINES/TUBES: Peripheral IVs  DISCUSSION: 62 year old male presenting with acute hypoxic respiratory failure, acute COPD exacerbation and acute pulmonary edema.  Chest x-ray shows diffuse alveolar and interstitial opacities much suggestive of pulmonary edema rather than an infectious process.  ASSESSMENT  Acute hypoxic respiratory failure Acute COPD exacerbation Acute pulmonary edema Elevated systolic pressures, pulmonary hypertension and mild diastolic function on last echo Anemia of chronic disease Hypokalemia Tobacco use disorder History of seizures, heartburn and depression  PLAN Continuous BiPAP and titrate off as tolerated IV diuretics IV steroids Nebulized bronchodilators Inhaled steroids Smoking cessation advice given Discontinue cefepime and vancomycin Start Levaquin Trend pro-calcitonin Monitor and replace  electrolytes Trend CBC Start Feosol 325 mg twice a day Anemia panel Trend pro-calcitonin and adjust antibiotics accordingly GI and DVT prophylaxis  FAMILY  - Updates: Patient and his brother updated at bedside.   Arman Loy S. [facility] ANP-BC Pulmonary and Critical Care Medicine [facility] Pager [phone] or [phone]  NB: This document was prepared using Dragon voice recognition software and may include unintentional dictation errors.    04/07/2017, 6:10 AM

## 2017-04-07 NOTE — Progress Notes (Signed)
[facility] - [place] at [facility]   PATIENT NAME: Marco [name]    MR#:  [id number]  DATE OF BIRTH:  January 25, 1955  SUBJECTIVE:  CHIEF COMPLAINT:   The patient was put on BiPAP due to worsening shortness of breath last night.  He is on O2 [place] 5 L, off BiPAP this morning. REVIEW OF SYSTEMS:  CONSTITUTIONAL: No fever, has generalized weakness.  EYES: No blurred or double vision.  EARS, NOSE, AND THROAT: No tinnitus or ear pain.  RESPIRATORY:  cough,  shortness of breath, wheezing , denies hemoptysis.  CARDIOVASCULAR: No chest pain, orthopnea, edema.  GASTROINTESTINAL: No nausea, vomiting, diarrhea or abdominal pain.  GENITOURINARY: No dysuria, hematuria.  ENDOCRINE: No polyuria, nocturia,  HEMATOLOGY: No anemia, easy bruising or bleeding SKIN: No rash or lesion. MUSCULOSKELETAL: No joint pain or arthritis.   NEUROLOGIC: No tingling, numbness, weakness.  PSYCHIATRY: No anxiety or depression.   DRUG ALLERGIES:   Allergies  Allergen Reactions  . Hydrocodone-Acetaminophen Nausea Only and Nausea And Vomiting    VITALS:  Blood pressure 115/68, pulse 98, temperature 97.9 F (36.6 C), temperature source Axillary, resp. rate (!) 28, height 5\' 6"  (1.676 m), weight 151 lb (68.5 kg), SpO2 97 %.  PHYSICAL EXAMINATION:  GENERAL:  62 y.o.-year-old patient lying in the bed with no acute distress.  EYES: Pupils equal, round, reactive to light and accommodation. No scleral icterus. Extraocular muscles intact.  HEENT: Head atraumatic, normocephalic. Oropharynx and nasopharynx clear.  NECK:  Supple, no jugular venous distention. No thyroid enlargement, no tenderness.  LUNGS: Diminished breath sounds bilaterally, coarse wheezing, no rales,rhonchi or crepitation.  No use of accessory muscles of respiration.  CARDIOVASCULAR: S1, S2 normal. No murmurs, rubs, or gallops.  ABDOMEN: Soft, nontender, nondistended. Bowel sounds present. No organomegaly or mass.  EXTREMITIES: No  pedal edema, cyanosis, or clubbing.  NEUROLOGIC: Cranial nerves II through XII are intact. Muscle strength generalized weakness in all extremities. Sensation intact. Gait not checked.  PSYCHIATRIC: The patient is alert and oriented x 3.  SKIN: No obvious rash, lesion, or ulcer.    LABORATORY PANEL:   CBC Recent Labs  Lab 04/07/17 0534  WBC 14.5*  HGB 7.6*  HCT 23.2*  PLT 235   ------------------------------------------------------------------------------------------------------------------  Chemistries  Recent Labs  Lab 04/06/17 2229 04/07/17 0534  NA 142 142  K 3.7 4.4  CL 113* 114*  CO2 23 25  GLUCOSE 168* 138*  BUN 13 14  CREATININE 0.55* 0.60*  CALCIUM 8.3* 8.4*  MG 2.2  --   AST 18  --   ALT 15*  --   ALKPHOS 93  --   BILITOT 1.0  --    ------------------------------------------------------------------------------------------------------------------  Cardiac Enzymes Recent Labs  Lab 04/06/17 2229  TROPONINI <0.03   ------------------------------------------------------------------------------------------------------------------  RADIOLOGY:  Dg Chest Port 1 View  Result Date: 04/07/2017 CLINICAL DATA:  Acute respiratory failure EXAM: PORTABLE CHEST 1 VIEW COMPARISON:  April 06, 2017 FINDINGS: Bilateral pulmonary infiltrates persist, stable on the right and potentially mildly improved on the left. No pneumothorax. Stable cardiomediastinal silhouette. IMPRESSION: Persistent bilateral pulmonary infiltrates as above. Electronically Signed   By: Gerome Sam III M.D   On: 04/07/2017 08:47   Dg Chest Port 1 View  Result Date: 04/06/2017 CLINICAL DATA:  Shortness of Breath EXAM: PORTABLE CHEST 1 VIEW COMPARISON:  03/28/2017 FINDINGS: Cardiac shadow is stable. Lungs are well aerated bilaterally with persistent interstitial and alveolar opacities stable from the previous study. No sizable effusion is seen. No pneumothorax  is noted. No bony abnormality is noted  IMPRESSION: Stable appearance of the chest when compare with the prior study. Electronically Signed   By: Alcide CleverMark  Lukens M.D.   On: 04/06/2017 10:55    EKG:   Orders placed or performed during the hospital encounter of 04/06/17  . ED EKG 12-Lead  . ED EKG 12-Lead  . EKG 12-Lead  . EKG 12-Lead  . EKG 12-Lead  . EKG 12-Lead  . EKG 12-Lead  . EKG 12-Lead    ASSESSMENT AND PLAN:    Marco RegisterMichael Yau  is a 62 y.o. male with a known history of smoking, hypertension, history of seizure disorder, COPD recently admitted with hypoxic respiratory failure remained on BiPAP chest x-rays suggestive of ARDS/pneumonia was [facility] five days ago comes back with shortness of breath and lethargic.  1. acute on chronic respiratory failure with hypoxia secondary to COPD exacerbation He was given magnesium, Solu-Medrol, Lasix-and bronchodilator and put on BiPAP. Off BIPAP, try to wean down O2 [place]. Continue Solu-Medrol, inhaler, nebulizer -Pro calcitonin is <0.1 and lactic acidosis improved. Dr. Lonn Georgiaonforti, the patient may need high-resolution CAT scan with both inspiratory expiratory and prone positioning. Connective tissue studies.  Patient may need open lung biopsy for further characterization to rule out cryptogenic organizing pneumonia, hypersensitivity, diffuse alveolar damage, DIP.  2. hypertension continue home meds  3. Seizure disorder, continue Keppra  4.  Anemia of chronic disease.  Follow-up hemoglobin.  Tobacco abuse.  Smoking cessation was counseled for 4 minutes. Discussed with Dr. Lonn Georgiaonforti. All the records are reviewed and case discussed with Care Management/Social Workerr. Management plans discussed with the patient, his wife and they are in agreement.  CODE STATUS: Full code  TOTAL CRITICAL CARE TIME TAKING CARE OF THIS PATIENT: 42  minutes.   POSSIBLE D/C IN 2-3 DAYS, DEPENDING ON CLINICAL CONDITION.  Note: This dictation was prepared with Dragon  dictation along with smaller phrase technology. Any transcriptional errors that result from this process are unintentional.   Shaune PollackQing Tanaisha Pittman M.D on 04/07/2017 at 2:48 PM  Between 7am to 6pm - Pager - [phone] After 6pm go to [email or web address] - password [email or web address] [facility]  [facility]  Office  [phone]  CC: Primary care physician; Jerl MinaHedrick, James, MD

## 2017-04-08 ENCOUNTER — Inpatient Hospital Stay: Payer: Medicare Other

## 2017-04-08 LAB — BASIC METABOLIC PANEL
Anion gap: 5 (ref 5–15)
BUN: 20 mg/dL (ref 6–20)
CALCIUM: 8.8 mg/dL — AB (ref 8.9–10.3)
CHLORIDE: 111 mmol/L (ref 101–111)
CO2: 26 mmol/L (ref 22–32)
CREATININE: 0.74 mg/dL (ref 0.61–1.24)
Glucose, Bld: 155 mg/dL — ABNORMAL HIGH (ref 65–99)
Potassium: 4.4 mmol/L (ref 3.5–5.1)
SODIUM: 142 mmol/L (ref 135–145)

## 2017-04-08 LAB — CBC
HCT: 23 % — ABNORMAL LOW (ref 40.0–52.0)
HEMOGLOBIN: 7.4 g/dL — AB (ref 13.0–18.0)
MCH: 32.8 pg (ref 26.0–34.0)
MCHC: 32.1 g/dL (ref 32.0–36.0)
MCV: 102.4 fL — ABNORMAL HIGH (ref 80.0–100.0)
PLATELETS: 269 10*3/uL (ref 150–440)
RBC: 2.25 MIL/uL — ABNORMAL LOW (ref 4.40–5.90)
RDW: 15.9 % — AB (ref 11.5–14.5)
WBC: 13.2 10*3/uL — ABNORMAL HIGH (ref 3.8–10.6)

## 2017-04-08 LAB — BRAIN NATRIURETIC PEPTIDE: B Natriuretic Peptide: 193 pg/mL — ABNORMAL HIGH (ref 0.0–100.0)

## 2017-04-08 LAB — PHOSPHORUS: PHOSPHORUS: 4.1 mg/dL (ref 2.5–4.6)

## 2017-04-08 LAB — RHEUMATOID FACTOR: RHEUMATOID FACTOR: 14.7 [IU]/mL — AB (ref 0.0–13.9)

## 2017-04-08 LAB — MAGNESIUM: MAGNESIUM: 2.2 mg/dL (ref 1.7–2.4)

## 2017-04-08 LAB — PROCALCITONIN: Procalcitonin: <0.1 ng/mL

## 2017-04-08 MED ORDER — FOLIC ACID 1 MG PO TABS
1.0000 mg | ORAL_TABLET | Freq: Every day | ORAL | Status: DC
  Administered 2017-04-08 – 2017-04-16 (×9): 1 mg via ORAL
  Filled 2017-04-08 (×9): qty 1

## 2017-04-08 MED ORDER — SODIUM CHLORIDE 0.9 % IV SOLN
750.0000 mg | Freq: Two times a day (BID) | INTRAVENOUS | Status: DC
  Administered 2017-04-08 – 2017-04-09 (×2): 750 mg via INTRAVENOUS
  Filled 2017-04-08 (×3): qty 7.5

## 2017-04-08 MED ORDER — ENOXAPARIN SODIUM 40 MG/0.4ML ~~LOC~~ SOLN
40.0000 mg | SUBCUTANEOUS | Status: DC
  Administered 2017-04-08 – 2017-04-15 (×8): 40 mg via SUBCUTANEOUS
  Filled 2017-04-08 (×8): qty 0.4

## 2017-04-08 MED ORDER — LEVETIRACETAM 500 MG/5ML IV SOLN
750.0000 mg | Freq: Once | INTRAVENOUS | Status: AC
  Administered 2017-04-08: 750 mg via INTRAVENOUS
  Filled 2017-04-08: qty 7.5

## 2017-04-08 NOTE — Progress Notes (Signed)
[facility] - [place] at [facility]   PATIENT NAME: Marco [name]    MR#:  [id number]  DATE OF BIRTH:  1955-10-26  SUBJECTIVE:  CHIEF COMPLAINT:   The patient was put on BiPAP due to worsening shortness of breath last night.  He is on O2 [place] 5 L, off BiPAP this morning. REVIEW OF SYSTEMS:  CONSTITUTIONAL: No fever, has generalized weakness.  EYES: No blurred or double vision.  EARS, NOSE, AND THROAT: No tinnitus or ear pain.  RESPIRATORY:  cough,  shortness of breath, wheezing , denies hemoptysis.  CARDIOVASCULAR: No chest pain, orthopnea, edema.  GASTROINTESTINAL: No nausea, vomiting, diarrhea or abdominal pain.  GENITOURINARY: No dysuria, hematuria.  ENDOCRINE: No polyuria, nocturia,  HEMATOLOGY: No anemia, easy bruising or bleeding SKIN: No rash or lesion. MUSCULOSKELETAL: No joint pain or arthritis.   NEUROLOGIC: No tingling, numbness, weakness.  PSYCHIATRY: No anxiety or depression.   DRUG ALLERGIES:   Allergies  Allergen Reactions  . Hydrocodone-Acetaminophen Nausea Only and Nausea And Vomiting    VITALS:  Blood pressure (!) 151/79, pulse (!) 112, temperature 98.3 F (36.8 C), temperature source Oral, resp. rate (!) 31, height 5\' 6"  (1.676 m), weight 151 lb (68.5 kg), SpO2 93 %.  PHYSICAL EXAMINATION:  GENERAL:  62 y.o.-year-old patient lying in the bed with no acute distress.  EYES: Pupils equal, round, reactive to light and accommodation. No scleral icterus. Extraocular muscles intact.  HEENT: Head atraumatic, normocephalic. Oropharynx and nasopharynx clear.  NECK:  Supple, no jugular venous distention. No thyroid enlargement, no tenderness.  LUNGS: Diminished breath sounds bilaterally, coarse wheezing, no rales,rhonchi or crepitation.  No use of accessory muscles of respiration.  CARDIOVASCULAR: S1, S2 normal. No murmurs, rubs, or gallops.  ABDOMEN: Soft, nontender, nondistended. Bowel sounds present. No organomegaly or mass.   EXTREMITIES: No pedal edema, cyanosis, or clubbing.  NEUROLOGIC: Cranial nerves II through XII are intact. Muscle strength generalized weakness in all extremities. Sensation intact. Gait not checked.  PSYCHIATRIC: The patient is alert and oriented x 3.  SKIN: No obvious rash, lesion, or ulcer.    LABORATORY PANEL:   CBC Recent Labs  Lab 04/08/17 0533  WBC 13.2*  HGB 7.4*  HCT 23.0*  PLT 269   ------------------------------------------------------------------------------------------------------------------  Chemistries  Recent Labs  Lab 04/06/17 2229  04/08/17 0533  NA 142   < > 142  K 3.7   < > 4.4  CL 113*   < > 111  CO2 23   < > 26  GLUCOSE 168*   < > 155*  BUN 13   < > 20  CREATININE 0.55*   < > 0.74  CALCIUM 8.3*   < > 8.8*  MG 2.2  --  2.2  AST 18  --   --   ALT 15*  --   --   ALKPHOS 93  --   --   BILITOT 1.0  --   --    < > = values in this interval not displayed.   ------------------------------------------------------------------------------------------------------------------  Cardiac Enzymes Recent Labs  Lab 04/06/17 2229  TROPONINI <0.03   ------------------------------------------------------------------------------------------------------------------  RADIOLOGY:  Ct Chest High Resolution  Result Date: 04/08/2017 CLINICAL DATA:  Inpatient. Current smoker. Hypoxic respiratory failure. COPD. EXAM: CT CHEST WITHOUT CONTRAST TECHNIQUE: Multidetector CT imaging of the chest was performed following the standard protocol without intravenous contrast. High resolution imaging of the lungs, as well as inspiratory and expiratory imaging, was performed. COMPARISON:  Chest radiograph from one day prior.  03/26/2017 chest CT angiogram. FINDINGS: Cardiovascular: Normal heart size. No significant pericardial fluid/thickening. Left circumflex coronary atherosclerosis. Atherosclerotic nonaneurysmal thoracic aorta. Normal caliber pulmonary arteries. Mediastinum/Nodes: No  discrete thyroid nodules. Unremarkable esophagus. No axillary adenopathy. Mild right paratracheal adenopathy up to 1.3 cm (series 2/image 59), unchanged. Mildly enlarged 1.2 cm left paratracheal node (series 2/image 58), stable. Stable mildly enlarged 1.0 cm subcarinal node (series 2/image 72). Stable enlarged 1.2 cm AP window node (series 2/image 53). Stable mild bilateral hilar adenopathy, poorly delineated on these noncontrast images. Lungs/Pleura: No pneumothorax. Small dependent bilateral pleural effusions are stable to mildly decreased since 03/26/2017 chest CT. Persistent patchy consolidation and ground-glass attenuation throughout both lungs with associated interlobular septal thickening (crazy paving pattern), involving all lung lobes, most severe in the upper lobes. Overall, the findings are fairly similar to the 03/26/2017 chest CT. The density of the opacities in the upper lobes is slightly decreased, although there are new/worsened regions of patchy consolidation and ground-glass attenuation in the superior segments of the lower lobes. No discrete lung masses or significant pulmonary nodules. No significant air trapping on the expiration sequence. No significant regions of traction bronchiectasis or frank honeycombing. Upper abdomen: Simple 1.3 cm posterior right liver lobe cyst. Subcentimeter hypodense anterior inferior right liver lobe lesion, too small to characterize. Musculoskeletal: No aggressive appearing focal osseous lesions. Stable symmetric mild gynecomastia. Moderate thoracic spondylosis. IMPRESSION: 1. Persistent severe patchy ground-glass opacity, interlobular septal thickening and consolidation throughout both lungs (dominant crazy paving pattern), most prominent in the upper lobes, overall relatively stable since 03/26/2017 chest CT. Acute interstitial pneumonia/ARDS favored. Diffuse alveolar hemorrhage such as from vasculitis could have this appearance. 2. Mediastinal and bilateral  hilar lymphadenopathy is nonspecific and stable, probably reactive. 3. One vessel coronary atherosclerosis. Aortic Atherosclerosis (ICD10-I70.0). Electronically Signed   By: Delbert PhenixJason A Poff M.D.   On: 04/08/2017 10:38   Dg Chest Port 1 View  Result Date: 04/07/2017 CLINICAL DATA:  Acute respiratory failure EXAM: PORTABLE CHEST 1 VIEW COMPARISON:  April 06, 2017 FINDINGS: Bilateral pulmonary infiltrates persist, stable on the right and potentially mildly improved on the left. No pneumothorax. Stable cardiomediastinal silhouette. IMPRESSION: Persistent bilateral pulmonary infiltrates as above. Electronically Signed   By: Gerome Samavid  Williams III M.D   On: 04/07/2017 08:47    EKG:   Orders placed or performed during the hospital encounter of 04/06/17  . ED EKG 12-Lead  . ED EKG 12-Lead  . EKG 12-Lead  . EKG 12-Lead  . EKG 12-Lead  . EKG 12-Lead    ASSESSMENT AND PLAN:    Lisabeth RegisterMichael Sensabaugh  is a 62 y.o. male with a known history of smoking, hypertension, history of seizure disorder, COPD recently admitted with hypoxic respiratory failure remained on BiPAP chest x-rays suggestive of ARDS/pneumonia was discharged to [facility] five days ago comes back with shortness of breath and lethargic.  1. acute on chronic respiratory failure with hypoxia secondary to COPD exacerbation, possible pneumonitis. He was given magnesium, Solu-Medrol, Lasix-and bronchodilator and put on BiPAP. Off BIPAP, try to wean down O2 [place]. Continue Solu-Medrol, inhaler, nebulizer -Pro calcitonin is <0.1 and lactic acidosis improved. Dr. Lonn Georgiaonforti, the patient may need high-resolution CAT scan with both inspiratory expiratory and prone positioning. Connective tissue studies.  Patient may need open lung biopsy for further characterization to rule out cryptogenic organizing pneumonia, hypersensitivity, diffuse alveolar damage, DIP.  Per Dr. Sung AmabileSimonds, the patient has possible pneumonitis, continue oxygen by nasal cannula and IV  steroid.  He still needs stepdown care.  2. hypertension continue home meds  3. Seizure disorder, continue Keppra  4.  Anemia of chronic disease.  Follow-up hemoglobin.  Tobacco abuse.  Smoking cessation was counseled for 4 minutes. Discussed with Dr. Sung Amabile. All the records are reviewed and case discussed with Care Management/Social Workerr. Management plans discussed with the patient, his wife and they are in agreement.  CODE STATUS: Full code  TOTAL CRITICAL CARE TIME TAKING CARE OF THIS PATIENT: 36  minutes.   POSSIBLE D/C IN 3 DAYS, DEPENDING ON CLINICAL CONDITION.  Note: This dictation was prepared with Dragon dictation along with smaller phrase technology. Any transcriptional errors that result from this process are unintentional.   Shaune Pollack M.D on 04/08/2017 at 4:36 PM  Between 7am to 6pm - Pager - [phone] After 6pm go to [email or web address] - password [email or web address] [facility]  [facility]  Office  [phone]  CC: Primary care physician; Jerl Mina, MD

## 2017-04-08 NOTE — Progress Notes (Signed)
Physical Therapy Evaluation Patient Details Name: Marco [name] Baughman MRN: [id number] DOB: June 26, 1955 Today's Date: 04/08/2017   History of Present Illness  Marco [name] Muenchow is a 62 y.o. male with a history of smoking, HTN, and seizure disorder who was recently discharged from the hospital 5 days ago after being admittedHCAP/ ARDS who presents for evaluation of shortness of breath. Patient reports that he never felt better when he went home. Started feeling worse over the last 24 hours. Patient continues to have cough and fever at home. No vomiting or diarrhea. He is complaining of severe constant shortness of breath. No chest pain, no abdominal pain. He endorses compliance with his antibiotics at home. Patient was hypoxic to 85% per EMS. Patient was given 2 albuterol treatments, one duoneb, and 125 mg of Solu-Medrol per EMS. Pt is now admitted for acute on chronic respiratory failure with hypoxia secondary to COPD exacerbation.  Clinical Impression  Pt admitted with above diagnosis. Pt currently with functional limitations due to the deficits listed below (see PT Problem List). Pt requires cues and assistance to move from supine to sitting. HR increases from 112 at rest to 120 during bed mobility. SaO2 remains >90% on 2L/min O2. Respiration rate increases and pt endorses DOE. Pt sits at EOB for short bout but refuses to attempt transfers or ambulation with therapy at this time due to fatigue/DOE. Pt will need to return to SNF at discharge. Pt will benefit from PT services to address deficits in strength, balance, and mobility in order to return to full function at home.         Follow Up Recommendations SNF    Equipment Recommendations  Other (comment)(May need a rolling walker. Unclear)    Recommendations for Other Services       Precautions / Restrictions Precautions Precautions: Fall Restrictions Weight Bearing Restrictions: No      Mobility  Bed Mobility Overal bed mobility: Needs  Assistance Bed Mobility: Supine to Sit     Supine to sit: Min assist     General bed mobility comments: Pt requires cues and assistance to move from supine to sitting. HR increases from 112 at rest to 120 during bed mobility. SaO2 remains >90% on 2L/min O2. Respiration rate increases and pt endorses DOE. Pt sits at EOB for short bout but refuses to attempt transfers or ambulation with therapy at this time due to fatigue/DOE  Transfers                    Ambulation/Gait                Stairs            Wheelchair Mobility    Modified Rankin (Stroke Patients Only)       Balance Overall balance assessment: Needs assistance Sitting-balance support: Feet supported Sitting balance-Leahy Scale: Fair                                       Pertinent Vitals/Pain Pain Assessment: No/denies pain    Home Living Family/patient expects to be discharged to:: Skilled nursing facility Living Arrangements: Spouse/significant other Available Help at Discharge: Family;Available 24 hours/day Type of Home: House Home Access: Stairs to enter Entrance Stairs-Rails: None Entrance Stairs-Number of Steps: 3 Home Layout: One level Home Equipment: Cane - single point;Walker - 2 wheels      Prior Function Level of Independence: Needs assistance  Gait / Transfers Assistance Needed: Pt reports that he uses spc when out of the house but no assistive device in the house. Pt states that he doesn't have a rolling walker but medical record states that pt has a walker. Pt reports that he has not fallen in the last 12 months but medical record states he has had multiple falls  ADL's / Homemaking Assistance Needed: has family to assist with running the home but he is able to dress and bathe himself normally        Hand Dominance   Dominant Hand: Right    Extremity/Trunk Assessment   Upper Extremity Assessment Upper Extremity Assessment: Generalized weakness     Lower Extremity Assessment Lower Extremity Assessment: Generalized weakness       Communication   Communication: No difficulties  Cognition Arousal/Alertness: Awake/alert Behavior During Therapy: Flat affect Overall Cognitive Status: Impaired/Different from baseline Area of Impairment: Orientation                 Orientation Level: Disoriented to;Time;Situation                    General Comments      Exercises     Assessment/Plan    PT Assessment Patient needs continued PT services  PT Problem List Decreased strength;Decreased range of motion;Decreased activity tolerance;Decreased balance;Decreased mobility;Decreased coordination;Decreased knowledge of use of DME;Decreased safety awareness;Cardiopulmonary status limiting activity;Obesity       PT Treatment Interventions DME instruction;Gait training;Functional mobility training;Therapeutic activities;Therapeutic exercise;Balance training;Patient/family education;Stair training    PT Goals (Current goals can be found in the Care Plan section)  Acute Rehab PT Goals Patient Stated Goal: Return to rehab PT Goal Formulation: With patient Time For Goal Achievement: 04/22/17 Potential to Achieve Goals: Good    Frequency Min 2X/week   Barriers to discharge        Co-evaluation               AM-PAC PT "6 Clicks" Daily Activity  Outcome Measure Difficulty turning over in bed (including adjusting bedclothes, sheets and blankets)?: A Lot Difficulty moving from lying on back to sitting on the side of the bed? : Unable Difficulty sitting down on and standing up from a chair with arms (e.g., wheelchair, bedside commode, etc,.)?: Unable Help needed moving to and from a bed to chair (including a wheelchair)?: Total Help needed walking in hospital room?: Total Help needed climbing 3-5 steps with a railing? : Total 6 Click Score: 7    End of Session Equipment Utilized During Treatment: Gait  belt;Oxygen Activity Tolerance: Patient limited by fatigue Patient left: with call bell/phone within reach;in bed;with bed alarm set;with SCD's reapplied Nurse Communication: Mobility status PT Visit Diagnosis: Muscle weakness (generalized) (M62.81);Difficulty in walking, not elsewhere classified (R26.2)    Time: 1610-9604 PT Time Calculation (min) (ACUTE ONLY): 24 min   Charges:   PT Evaluation $PT Eval Low Complexity: 1 Low     PT G Codes:        Sharalyn Ink Syniyah Bourne PT, DPT    Joffrey Kerce 04/08/2017, 8:35 PM

## 2017-04-08 NOTE — Progress Notes (Signed)
Pt seen on rounds this AM and care plan reviewed in detail and modified. Moderately labored on [place] O2. HRCT with crazy paving pattern suggesting pneumonitis. Continue monitoring in SDU. Continue systemic steroids  Billy Fischeravid Andria Head, MD PCCM service Mobile [phone] Pager [phone] 04/08/2017 1:53 PM

## 2017-04-08 NOTE — Progress Notes (Signed)
Pt alert . Pt has been off bipap since 10pm tolerating [place] at 4L.

## 2017-04-08 NOTE — Care Management (Signed)
Admitted from [facility]. [facility]  Patient required admission to icu stepdown due to worsening shortness of breath and increased work of breathing.  Has been on HFNC and bipap and has now transitioned to nasal cannula. Being treated for respiratory failure due to COPD exac and pulmonary edema

## 2017-04-09 ENCOUNTER — Inpatient Hospital Stay: Payer: Medicare Other

## 2017-04-09 LAB — COMPREHENSIVE METABOLIC PANEL
ALT: 13 U/L — AB (ref 17–63)
AST: 17 U/L (ref 15–41)
Albumin: 2.5 g/dL — ABNORMAL LOW (ref 3.5–5.0)
Alkaline Phosphatase: 74 U/L (ref 38–126)
Anion gap: 5 (ref 5–15)
BILIRUBIN TOTAL: 0.7 mg/dL (ref 0.3–1.2)
BUN: 23 mg/dL — ABNORMAL HIGH (ref 6–20)
CALCIUM: 9.1 mg/dL (ref 8.9–10.3)
CO2: 26 mmol/L (ref 22–32)
CREATININE: 0.8 mg/dL (ref 0.61–1.24)
Chloride: 108 mmol/L (ref 101–111)
Glucose, Bld: 137 mg/dL — ABNORMAL HIGH (ref 65–99)
Potassium: 4.4 mmol/L (ref 3.5–5.1)
Sodium: 139 mmol/L (ref 135–145)
TOTAL PROTEIN: 6.1 g/dL — AB (ref 6.5–8.1)

## 2017-04-09 LAB — ANA COMPREHENSIVE PANEL
Anti JO-1: <0.2 AI (ref 0.0–0.9)
Ribonucleic Protein: <0.2 AI (ref 0.0–0.9)
SSA (Ro) (ENA) Antibody, IgG: <0.2 AI (ref 0.0–0.9)
SSB (La) (ENA) Antibody, IgG: <0.2 AI (ref 0.0–0.9)
Scleroderma (Scl-70) (ENA) Antibody, IgG: <0.2 AI (ref 0.0–0.9)

## 2017-04-09 LAB — CBC
HCT: 23.9 % — ABNORMAL LOW (ref 40.0–52.0)
Hemoglobin: 7.8 g/dL — ABNORMAL LOW (ref 13.0–18.0)
MCH: 33.3 pg (ref 26.0–34.0)
MCHC: 32.5 g/dL (ref 32.0–36.0)
MCV: 102.3 fL — ABNORMAL HIGH (ref 80.0–100.0)
PLATELETS: 285 10*3/uL (ref 150–440)
RBC: 2.33 MIL/uL — AB (ref 4.40–5.90)
RDW: 15.5 % — AB (ref 11.5–14.5)
WBC: 9.1 10*3/uL (ref 3.8–10.6)

## 2017-04-09 LAB — PAN-ANCA
C-ANCA: <1:20 {titer}
P-ANCA: <1:20 {titer}

## 2017-04-09 MED ORDER — IPRATROPIUM-ALBUTEROL 0.5-2.5 (3) MG/3ML IN SOLN
3.0000 mL | RESPIRATORY_TRACT | Status: DC
  Administered 2017-04-09 – 2017-04-11 (×12): 3 mL via RESPIRATORY_TRACT
  Filled 2017-04-09 (×12): qty 3

## 2017-04-09 MED ORDER — SENNOSIDES-DOCUSATE SODIUM 8.6-50 MG PO TABS
2.0000 | ORAL_TABLET | Freq: Two times a day (BID) | ORAL | Status: DC
  Administered 2017-04-09 – 2017-04-16 (×12): 2 via ORAL
  Filled 2017-04-09 (×14): qty 2

## 2017-04-09 MED ORDER — FUROSEMIDE 10 MG/ML IJ SOLN
60.0000 mg | Freq: Once | INTRAMUSCULAR | Status: AC
  Administered 2017-04-09: 60 mg via INTRAVENOUS
  Filled 2017-04-09: qty 6

## 2017-04-09 MED ORDER — ORAL CARE MOUTH RINSE
15.0000 mL | Freq: Two times a day (BID) | OROMUCOSAL | Status: DC
  Administered 2017-04-10 – 2017-04-16 (×5): 15 mL via OROMUCOSAL

## 2017-04-09 MED ORDER — LEVETIRACETAM 750 MG PO TABS
750.0000 mg | ORAL_TABLET | Freq: Two times a day (BID) | ORAL | Status: DC
  Administered 2017-04-09: 22:00:00 750 mg via ORAL
  Filled 2017-04-09 (×2): qty 1

## 2017-04-09 MED ORDER — METHYLPREDNISOLONE SODIUM SUCC 40 MG IJ SOLR
40.0000 mg | Freq: Two times a day (BID) | INTRAMUSCULAR | Status: DC
  Administered 2017-04-09 – 2017-04-10 (×2): 40 mg via INTRAVENOUS
  Filled 2017-04-09 (×2): qty 1

## 2017-04-09 MED ORDER — BUDESONIDE 0.5 MG/2ML IN SUSP
0.5000 mg | Freq: Two times a day (BID) | RESPIRATORY_TRACT | Status: DC
  Administered 2017-04-09 – 2017-04-16 (×14): 0.5 mg via RESPIRATORY_TRACT
  Filled 2017-04-09 (×14): qty 2

## 2017-04-09 NOTE — Progress Notes (Signed)
Patient's work of breathing improved after diuresis from IV lasix.  Total 1600ml UOP.  Patient's [facility] 1520 for O2 sat goal of 90-96%.  Patient has been off Bipap since yesterday afternoon.   Pt with intermittent confusion and agitation.  Per wife, this is normal for patient.  Pt does take nasal cannula off at times.  WOB remains the same with O2 sats 93-95% on room air.  Will encourage patient to wear 2L whenever possible.  Patient to transfer to floor.  Report given to [facility], RN

## 2017-04-09 NOTE — Progress Notes (Signed)
[facility] - [place] at [facility]   PATIENT NAME: Marco RegisterMichael Byrom    MR#:  [id number]  DATE OF BIRTH:  March 06, 1955  SUBJECTIVE:  CHIEF COMPLAINT:   The patient still has shortness of breath and cough, on O2 [place] 3 L, was on BiPAP last night REVIEW OF SYSTEMS:  CONSTITUTIONAL: No fever, has generalized weakness.  EYES: No blurred or double vision.  EARS, NOSE, AND THROAT: No tinnitus or ear pain.  RESPIRATORY:  cough,  shortness of breath, wheezing , denies hemoptysis.  CARDIOVASCULAR: No chest pain, orthopnea, edema.  GASTROINTESTINAL: No nausea, vomiting, diarrhea or abdominal pain.  GENITOURINARY: No dysuria, hematuria.  ENDOCRINE: No polyuria, nocturia,  HEMATOLOGY: No anemia, easy bruising or bleeding SKIN: No rash or lesion. MUSCULOSKELETAL: No joint pain or arthritis.   NEUROLOGIC: No tingling, numbness, weakness.  PSYCHIATRY: No anxiety or depression.   DRUG ALLERGIES:   Allergies  Allergen Reactions  . Hydrocodone-Acetaminophen Nausea Only and Nausea And Vomiting    VITALS:  Blood pressure (!) 156/93, pulse (!) 101, temperature 98 F (36.7 C), temperature source Oral, resp. rate (!) 28, height 5\' 6"  (1.676 m), weight 151 lb (68.5 kg), SpO2 99 %.  PHYSICAL EXAMINATION:  GENERAL:  62 y.o.-year-old patient lying in the bed with no acute distress.  EYES: Pupils equal, round, reactive to light and accommodation. No scleral icterus. Extraocular muscles intact.  HEENT: Head atraumatic, normocephalic. Oropharynx and nasopharynx clear.  NECK:  Supple, no jugular venous distention. No thyroid enlargement, no tenderness.  LUNGS: Diminished breath sounds bilaterally, coarse wheezing, bilateral crackles.  No use of accessory muscles of respiration.  CARDIOVASCULAR: S1, S2 normal. No murmurs, rubs, or gallops.  ABDOMEN: Soft, nontender, nondistended. Bowel sounds present. No organomegaly or mass.  EXTREMITIES: No pedal edema, cyanosis, or clubbing.   NEUROLOGIC: Cranial nerves II through XII are intact. Muscle strength generalized weakness in all extremities. Sensation intact. Gait not checked.  PSYCHIATRIC: The patient is alert and oriented x 3.  SKIN: No obvious rash, lesion, or ulcer.    LABORATORY PANEL:   CBC Recent Labs  Lab 04/09/17 0436  WBC 9.1  HGB 7.8*  HCT 23.9*  PLT 285   ------------------------------------------------------------------------------------------------------------------  Chemistries  Recent Labs  Lab 04/08/17 0533 04/09/17 0436  NA 142 139  K 4.4 4.4  CL 111 108  CO2 26 26  GLUCOSE 155* 137*  BUN 20 23*  CREATININE 0.74 0.80  CALCIUM 8.8* 9.1  MG 2.2  --   AST  --  17  ALT  --  13*  ALKPHOS  --  74  BILITOT  --  0.7   ------------------------------------------------------------------------------------------------------------------  Cardiac Enzymes Recent Labs  Lab 04/06/17 2229  TROPONINI <0.03   ------------------------------------------------------------------------------------------------------------------  RADIOLOGY:  Ct Chest High Resolution  Result Date: 04/08/2017 CLINICAL DATA:  Inpatient. Current smoker. Hypoxic respiratory failure. COPD. EXAM: CT CHEST WITHOUT CONTRAST TECHNIQUE: Multidetector CT imaging of the chest was performed following the standard protocol without intravenous contrast. High resolution imaging of the lungs, as well as inspiratory and expiratory imaging, was performed. COMPARISON:  Chest radiograph from one day prior. 03/26/2017 chest CT angiogram. FINDINGS: Cardiovascular: Normal heart size. No significant pericardial fluid/thickening. Left circumflex coronary atherosclerosis. Atherosclerotic nonaneurysmal thoracic aorta. Normal caliber pulmonary arteries. Mediastinum/Nodes: No discrete thyroid nodules. Unremarkable esophagus. No axillary adenopathy. Mild right paratracheal adenopathy up to 1.3 cm (series 2/image 59), unchanged. Mildly enlarged 1.2 cm  left paratracheal node (series 2/image 58), stable. Stable mildly enlarged 1.0 cm subcarinal node (  series 2/image 72). Stable enlarged 1.2 cm AP window node (series 2/image 53). Stable mild bilateral hilar adenopathy, poorly delineated on these noncontrast images. Lungs/Pleura: No pneumothorax. Small dependent bilateral pleural effusions are stable to mildly decreased since 03/26/2017 chest CT. Persistent patchy consolidation and ground-glass attenuation throughout both lungs with associated interlobular septal thickening (crazy paving pattern), involving all lung lobes, most severe in the upper lobes. Overall, the findings are fairly similar to the 03/26/2017 chest CT. The density of the opacities in the upper lobes is slightly decreased, although there are new/worsened regions of patchy consolidation and ground-glass attenuation in the superior segments of the lower lobes. No discrete lung masses or significant pulmonary nodules. No significant air trapping on the expiration sequence. No significant regions of traction bronchiectasis or frank honeycombing. Upper abdomen: Simple 1.3 cm posterior right liver lobe cyst. Subcentimeter hypodense anterior inferior right liver lobe lesion, too small to characterize. Musculoskeletal: No aggressive appearing focal osseous lesions. Stable symmetric mild gynecomastia. Moderate thoracic spondylosis. IMPRESSION: 1. Persistent severe patchy ground-glass opacity, interlobular septal thickening and consolidation throughout both lungs (dominant crazy paving pattern), most prominent in the upper lobes, overall relatively stable since 03/26/2017 chest CT. Acute interstitial pneumonia/ARDS favored. Diffuse alveolar hemorrhage such as from vasculitis could have this appearance. 2. Mediastinal and bilateral hilar lymphadenopathy is nonspecific and stable, probably reactive. 3. One vessel coronary atherosclerosis. Aortic Atherosclerosis (ICD10-I70.0). Electronically Signed   By: Delbert Phenix M.D.   On: 04/08/2017 10:38   Dg Chest Port 1 View  Result Date: 04/09/2017 CLINICAL DATA:  Respiratory failure. EXAM: PORTABLE CHEST 1 VIEW COMPARISON:  CT 04/08/2017.  Chest x-ray 04/07/2017, 03/26/2017. FINDINGS: Stable mild mediastinal prominence, most likely from previously identified lymph nodes. Heart size stable. Diffuse unchanged bilateral pulmonary infiltrates. Small left pleural effusion again noted. Right costophrenic angle incompletely imaged. No pneumothorax. Degenerative changes thoracic spine with thoracic spine scoliosis. IMPRESSION: Diffuse bilateral pulmonary infiltrates unchanged. Electronically Signed   By: Maisie Fus  [name]   On: 04/09/2017 06:44    EKG:   Orders placed or performed during the hospital encounter of 04/06/17  . ED EKG 12-Lead  . ED EKG 12-Lead  . EKG 12-Lead  . EKG 12-Lead  . EKG 12-Lead  . EKG 12-Lead    ASSESSMENT AND PLAN:    Jvon Meroney  is a 62 y.o. male with a known history of smoking, hypertension, history of seizure disorder, COPD recently admitted with hypoxic respiratory failure remained on BiPAP chest x-rays suggestive of ARDS/pneumonia was [facility] five days ago comes back with shortness of breath and lethargic.  1. acute on chronic respiratory failure with hypoxia secondary to COPD exacerbation, possible pneumonitis. He was given magnesium, Solu-Medrol, Lasix-and bronchodilator and put on BiPAP. Off BIPAP, try to wean down O2 [place]. Continue Solu-Medrol, inhaler, nebulizer -Pro calcitonin is <0.1 and lactic acidosis improved. Dr. Lonn Georgia, the patient may need high-resolution CAT scan with both inspiratory expiratory and prone positioning. Connective tissue studies.  Patient may need open lung biopsy for further characterization to rule out cryptogenic organizing pneumonia, hypersensitivity, diffuse alveolar damage, DIP.  Per Dr. Sung Amabile, the patient has possible pneumonitis, continue oxygen by nasal cannula  and IV steroid.  He still needs stepdown care.  2. hypertension continue home meds  3. Seizure disorder, continue Keppra  4.  Anemia of chronic disease.  Hemoglobin is stable.  Tobacco abuse.  Smoking cessation was counseled for 4 minutes. Discussed with Dr. Belia Heman. All the records are reviewed and case discussed  with Care Management/Social Workerr. Management plans discussed with the patient, his wife and they are in agreement.  Generalized weakness.  PT evaluation suggest skilled nursing facility placement. CODE STATUS: Full code  TOTAL CARE TIME TAKING CARE OF THIS PATIENT: 36  minutes.   POSSIBLE D/C IN 2-3 DAYS, DEPENDING ON CLINICAL CONDITION.  Note: This dictation was prepared with Dragon dictation along with smaller phrase technology. Any transcriptional errors that result from this process are unintentional.   Shaune Pollack M.D on 04/09/2017 at 3:02 PM  Between 7am to 6pm - Pager - [phone] After 6pm go to [email or web address] - password [email or web address] [facility]  [place] [facility]  Office  [phone]  CC: Primary care physician; Jerl Mina, MD

## 2017-04-09 NOTE — Progress Notes (Signed)
Pharmacy Electrolyte Monitoring Consult:  Pharmacy consulted to assist in monitoring and replacing electrolytes in this 62 y.o. male admitted on 04/06/2017 with acute on chronic respiratory failure.   Patient ordered furosemide 60mg  IV x 1 on am CCM rounds.    Labs:  Sodium (mmol/[name])  Date Value  04/09/2017 139  11/11/2013 138   Potassium (mmol/[name])  Date Value  04/09/2017 4.4  11/11/2013 4.1   Magnesium (mg/dL)  Date Value  16/10/960404/08/2017 2.2   Phosphorus (mg/dL)  Date Value  54/09/811904/08/2017 4.1   Calcium (mg/dL)  Date Value  14/78/295604/09/2017 9.1   Calcium, Total (mg/dL)  Date Value  21/30/865711/11/2013 8.5   Albumin (g/dL)  Date Value  84/69/629504/09/2017 2.5 ([name])  12/17/2012 3.4    Plan: Will recheck electrolytes with am labs.   Pharmacy will continue to monitor and adjust per consult.   Marco [name],Marco [name] 04/09/2017 1:47 PM

## 2017-04-09 NOTE — Progress Notes (Signed)
PHARMACIST - PHYSICIAN COMMUNICATION  CONCERNING: IV to Oral Route Change Policy  RECOMMENDATION: This patient is receiving Keppra by the intravenous route.  Based on criteria approved by the Pharmacy and Therapeutics Committee, the intravenous medication(s) is/are being converted to the equivalent oral dose form(s).   DESCRIPTION: These criteria include:  The patient is eating (either orally or via tube) and/or has been taking other orally administered medications for a least 24 hours  The patient has no evidence of active gastrointestinal bleeding or impaired GI absorption (gastrectomy, short bowel, patient on TNA or NPO).  If you have questions about this conversion, please contact the Pharmacy Department  []   [phone]( [phone] )  [facility] [x]   [phone]( [phone] )  [facility] []   [phone]( [phone] )  [facility] []   [phone]( [phone]  [facility] []   [phone]( [phone] )  [facility]   Simpson,Jaelin L, [facility] 04/09/2017 1:46 PM

## 2017-04-09 NOTE — Progress Notes (Signed)
[facility] is a short term rehab resident and can return when stable.   Baker Hughes IncorporatedBailey Alfredia Desanctis, LCSW [phone]

## 2017-04-09 NOTE — Progress Notes (Deleted)
Pharmacy Electrolyte Monitoring Consult:  Pharmacy consulted to assist in monitoring and replacing electrolytes in this 62 y.o. male admitted on 04/06/2017 with Respiratory Distress  Patient with elevated potassium with am labs. IVF stopped on am ICU rounds.   Labs:  Sodium (mmol/[name])  Date Value  04/09/2017 139  11/11/2013 138   Potassium (mmol/[name])  Date Value  04/09/2017 4.4  11/11/2013 4.1   Magnesium (mg/dL)  Date Value  16/10/960404/08/2017 2.2   Phosphorus (mg/dL)  Date Value  54/09/811904/08/2017 4.1   Calcium (mg/dL)  Date Value  14/78/295604/09/2017 9.1   Calcium, Total (mg/dL)  Date Value  21/30/865711/11/2013 8.5   Albumin (g/dL)  Date Value  84/69/629504/09/2017 2.5 ([name])  12/17/2012 3.4    Plan:   Potassium recheck is within normal limits. Will recheck BMP with am labs.   Pharmacy will continue to monitor and adjust per consult.    Marco [name],Marco [name] 04/09/2017 6:35 PM

## 2017-04-09 NOTE — Progress Notes (Signed)
Pt not on bipap at this time, is on 4l [place] with adequate saturations of 99%.

## 2017-04-10 ENCOUNTER — Inpatient Hospital Stay: Payer: Medicare Other

## 2017-04-10 DIAGNOSIS — J9621 Acute and chronic respiratory failure with hypoxia: Secondary | ICD-10-CM

## 2017-04-10 LAB — BASIC METABOLIC PANEL
ANION GAP: 3 — AB (ref 5–15)
BUN: 24 mg/dL — ABNORMAL HIGH (ref 6–20)
CALCIUM: 8.9 mg/dL (ref 8.9–10.3)
CO2: 31 mmol/L (ref 22–32)
Chloride: 105 mmol/L (ref 101–111)
Creatinine, Ser: 0.65 mg/dL (ref 0.61–1.24)
GFR calc Af Amer: >60 mL/min (ref >60)
GLUCOSE: 99 mg/dL (ref 65–99)
Potassium: 4.3 mmol/L (ref 3.5–5.1)
SODIUM: 139 mmol/L (ref 135–145)

## 2017-04-10 LAB — BLOOD GAS, ARTERIAL
ACID-BASE EXCESS: 9.6 mmol/L — AB (ref 0.0–2.0)
Bicarbonate: 35.5 mmol/L — ABNORMAL HIGH (ref 20.0–28.0)
DELIVERY SYSTEMS: POSITIVE
Expiratory PAP: 5
FIO2: 35
INSPIRATORY PAP: 10
O2 Saturation: 98.1 %
PATIENT TEMPERATURE: 37
PCO2 ART: 56 mmHg — AB (ref 32.0–48.0)
pH, Arterial: 7.41 (ref 7.350–7.450)
pO2, Arterial: 106 mmHg (ref 83.0–108.0)

## 2017-04-10 MED ORDER — FUROSEMIDE 10 MG/ML IJ SOLN
40.0000 mg | Freq: Two times a day (BID) | INTRAMUSCULAR | Status: DC
  Administered 2017-04-10 – 2017-04-13 (×6): 40 mg via INTRAVENOUS
  Filled 2017-04-10 (×7): qty 4

## 2017-04-10 MED ORDER — FUROSEMIDE 10 MG/ML IJ SOLN
20.0000 mg | Freq: Once | INTRAMUSCULAR | Status: AC
  Administered 2017-04-10: 09:00:00 20 mg via INTRAVENOUS
  Filled 2017-04-10: qty 2

## 2017-04-10 MED ORDER — LEVETIRACETAM 100 MG/ML PO SOLN
750.0000 mg | Freq: Two times a day (BID) | ORAL | Status: DC
  Administered 2017-04-10 – 2017-04-15 (×12): 750 mg via ORAL
  Filled 2017-04-10 (×14): qty 7.5

## 2017-04-10 MED ORDER — MAGNESIUM SULFATE 2 GM/50ML IV SOLN
2.0000 g | Freq: Once | INTRAVENOUS | Status: AC
  Administered 2017-04-10: 2 g via INTRAVENOUS
  Filled 2017-04-10: qty 50

## 2017-04-10 MED ORDER — METHYLPREDNISOLONE SODIUM SUCC 125 MG IJ SOLR
80.0000 mg | Freq: Three times a day (TID) | INTRAMUSCULAR | Status: DC
  Administered 2017-04-10 – 2017-04-15 (×14): 80 mg via INTRAVENOUS
  Filled 2017-04-10 (×14): qty 2

## 2017-04-10 MED ORDER — METHYLPREDNISOLONE SODIUM SUCC 40 MG IJ SOLR: 40.0000 mg | Freq: Three times a day (TID) | INTRAMUSCULAR | Status: DC

## 2017-04-10 NOTE — Progress Notes (Addendum)
[facility] - [place] at [facility]   PATIENT NAME: Marco [name]    MR#:  [id number]  DATE OF BIRTH:  1955-02-15  SUBJECTIVE:  CHIEF COMPLAINT:   The patient still has shortness of breath and working hard to breathe by using all his accessory muscles, willing to try BiPAP.  Refused BiPAP last evening REVIEW OF SYSTEMS:  CONSTITUTIONAL: No fever, has generalized weakness.  EYES: No blurred or double vision.  EARS, NOSE, AND THROAT: No tinnitus or ear pain.  RESPIRATORY:  cough, worsening of shortness of breath, wheezing , denies hemoptysis.  CARDIOVASCULAR: No chest pain, orthopnea, edema.  GASTROINTESTINAL: No nausea, vomiting, diarrhea or abdominal pain.  GENITOURINARY: No dysuria, hematuria.  ENDOCRINE: No polyuria, nocturia,  HEMATOLOGY: No anemia, easy bruising or bleeding SKIN: No rash or lesion. MUSCULOSKELETAL: No joint pain or arthritis.   NEUROLOGIC: No tingling, numbness, weakness.  PSYCHIATRY: No anxiety or depression.   DRUG ALLERGIES:   Allergies  Allergen Reactions  . Hydrocodone-Acetaminophen Nausea Only and Nausea And Vomiting    VITALS:  Blood pressure (!) 141/93, pulse 99, temperature (!) 97.5 F (36.4 C), temperature source Oral, resp. rate (!) 26, height 5\' 6"  (1.676 m), weight 68.5 kg (151 lb), SpO2 99 %.  PHYSICAL EXAMINATION:  GENERAL:  62 y.o.-year-old patient lying in the bed with no acute distress.  EYES: Pupils equal, round, reactive to light and accommodation. No scleral icterus. Extraocular muscles intact.  HEENT: Head atraumatic, normocephalic. Oropharynx and nasopharynx clear.  NECK:  Supple, no jugular venous distention. No thyroid enlargement, no tenderness.  LUNGS: Diminished breath sounds bilaterally, coarse wheezing, bilateral crackles.  Positive use of accessory muscles of respiration.  CARDIOVASCULAR: S1, S2 normal. No murmurs, rubs, or gallops.  ABDOMEN: Soft, nontender, nondistended. Bowel sounds present.  No organomegaly or mass.  EXTREMITIES: No pedal edema, cyanosis, or clubbing.  NEUROLOGIC: Cranial nerves II through XII are intact. Muscle strength generalized weakness in all extremities. Sensation intact. Gait not checked.  PSYCHIATRIC: The patient is alert and oriented x 3.  SKIN: No obvious rash, lesion, or ulcer.    LABORATORY PANEL:   CBC Recent Labs  Lab 04/09/17 0436  WBC 9.1  HGB 7.8*  HCT 23.9*  PLT 285   ------------------------------------------------------------------------------------------------------------------  Chemistries  Recent Labs  Lab 04/08/17 0533 04/09/17 0436 04/10/17 0636  NA 142 139 139  K 4.4 4.4 4.3  CL 111 108 105  CO2 26 26 31   GLUCOSE 155* 137* 99  BUN 20 23* 24*  CREATININE 0.74 0.80 0.65  CALCIUM 8.8* 9.1 8.9  MG 2.2  --   --   AST  --  17  --   ALT  --  13*  --   ALKPHOS  --  74  --   BILITOT  --  0.7  --    ------------------------------------------------------------------------------------------------------------------  Cardiac Enzymes Recent Labs  Lab 04/06/17 2229  TROPONINI <0.03   ------------------------------------------------------------------------------------------------------------------  RADIOLOGY:  Ct Chest High Resolution  Result Date: 04/08/2017 CLINICAL DATA:  Inpatient. Current smoker. Hypoxic respiratory failure. COPD. EXAM: CT CHEST WITHOUT CONTRAST TECHNIQUE: Multidetector CT imaging of the chest was performed following the standard protocol without intravenous contrast. High resolution imaging of the lungs, as well as inspiratory and expiratory imaging, was performed. COMPARISON:  Chest radiograph from one day prior. 03/26/2017 chest CT angiogram. FINDINGS: Cardiovascular: Normal heart size. No significant pericardial fluid/thickening. Left circumflex coronary atherosclerosis. Atherosclerotic nonaneurysmal thoracic aorta. Normal caliber pulmonary arteries. Mediastinum/Nodes: No discrete thyroid nodules.  Unremarkable  esophagus. No axillary adenopathy. Mild right paratracheal adenopathy up to 1.3 cm (series 2/image 59), unchanged. Mildly enlarged 1.2 cm left paratracheal node (series 2/image 58), stable. Stable mildly enlarged 1.0 cm subcarinal node (series 2/image 72). Stable enlarged 1.2 cm AP window node (series 2/image 53). Stable mild bilateral hilar adenopathy, poorly delineated on these noncontrast images. Lungs/Pleura: No pneumothorax. Small dependent bilateral pleural effusions are stable to mildly decreased since 03/26/2017 chest CT. Persistent patchy consolidation and ground-glass attenuation throughout both lungs with associated interlobular septal thickening (crazy paving pattern), involving all lung lobes, most severe in the upper lobes. Overall, the findings are fairly similar to the 03/26/2017 chest CT. The density of the opacities in the upper lobes is slightly decreased, although there are new/worsened regions of patchy consolidation and ground-glass attenuation in the superior segments of the lower lobes. No discrete lung masses or significant pulmonary nodules. No significant air trapping on the expiration sequence. No significant regions of traction bronchiectasis or frank honeycombing. Upper abdomen: Simple 1.3 cm posterior right liver lobe cyst. Subcentimeter hypodense anterior inferior right liver lobe lesion, too small to characterize. Musculoskeletal: No aggressive appearing focal osseous lesions. Stable symmetric mild gynecomastia. Moderate thoracic spondylosis. IMPRESSION: 1. Persistent severe patchy ground-glass opacity, interlobular septal thickening and consolidation throughout both lungs (dominant crazy paving pattern), most prominent in the upper lobes, overall relatively stable since 03/26/2017 chest CT. Acute interstitial pneumonia/ARDS favored. Diffuse alveolar hemorrhage such as from vasculitis could have this appearance. 2. Mediastinal and bilateral hilar lymphadenopathy is  nonspecific and stable, probably reactive. 3. One vessel coronary atherosclerosis. Aortic Atherosclerosis (ICD10-I70.0). Electronically Signed   By: Delbert Phenix M.D.   On: 04/08/2017 10:38   Dg Chest Port 1 View  Result Date: 04/09/2017 CLINICAL DATA:  Respiratory failure. EXAM: PORTABLE CHEST 1 VIEW COMPARISON:  CT 04/08/2017.  Chest x-ray 04/07/2017, 03/26/2017. FINDINGS: Stable mild mediastinal prominence, most likely from previously identified lymph nodes. Heart size stable. Diffuse unchanged bilateral pulmonary infiltrates. Small left pleural effusion again noted. Right costophrenic angle incompletely imaged. No pneumothorax. Degenerative changes thoracic spine with thoracic spine scoliosis. IMPRESSION: Diffuse bilateral pulmonary infiltrates unchanged. Electronically Signed   By: Maisie Fus  Register   On: 04/09/2017 06:44    EKG:   Orders placed or performed during the hospital encounter of 04/06/17  . ED EKG 12-Lead  . ED EKG 12-Lead  . EKG 12-Lead  . EKG 12-Lead  . EKG 12-Lead  . EKG 12-Lead    ASSESSMENT AND PLAN:    Morgen Ritacco  is a 62 y.o. male with a known history of smoking, hypertension, history of seizure disorder, COPD recently admitted with hypoxic respiratory failure remained on BiPAP chest x-rays suggestive of ARDS/pneumonia was [facility] five days ago comes back with shortness of breath and lethargic.  1. acute on chronic respiratory failure with hypoxia secondary to COPD exacerbation, possible pneumonitis. No significant improvement with BiPAP trial and patient is pulling his mask  will get stat chest x-ray  [facility] will transfer patient to stepdown unit, d/w dr.simonds Lasix 20 mg IV given once Solu-Medrol will be continued with bronchodilator treatments -Pro calcitonin is <0.1 and lactic acidosis improved. Per Dr. Lonn Georgia, the patient may need high-resolution CAT scan with both inspiratory expiratory and prone positioning. Connective tissue  studies.  Patient may need open lung biopsy for further characterization to rule out cryptogenic organizing pneumonia, hypersensitivity, diffuse alveolar damage, DIP.  2. hypertension continue home meds  3. Seizure disorder, continue Keppra  4.  Anemia  of chronic disease.  Hemoglobin is stable.  Tobacco abuse.  Smoking cessation was counseled for 4 minutes by Dr. Imogene Burnhen   All the records are reviewed and case discussed with Care Management/Social Workerr. Management plans discussed with the patient, his wife and they are in agreement.  Generalized weakness.  PT evaluation suggest skilled nursing facility placement. CODE STATUS: Full code  TOTAL CRITICAL CARE CARE TIME TAKING CARE OF THIS PATIENT: 41 minutes.   POSSIBLE D/C IN 2-3 DAYS, DEPENDING ON CLINICAL CONDITION.  Note: This dictation was prepared with Dragon dictation along with smaller phrase technology. Any transcriptional errors that result from this process are unintentional.   Ramonita LabAruna Gladine Plude M.D on 04/10/2017 at 9:32 AM  Between 7am to 6pm - Pager - [phone] After 6pm go to [email or web address] - password [email or web address] [facility]  [facility]  Office  [phone]  CC: Primary care physician; Jerl MinaHedrick, James, MD

## 2017-04-10 NOTE — Progress Notes (Signed)
[facility] [place] [facility]     Assessment and Plan:  Acute respiratory failure, hypoxic. -Patient currently on BiPAP, due to respiratory effort. - Will change to high fluid to see if this may help with his respiratory effort, if not improved will need to switch back to BiPAP, transfer patient back to stepdown unit. -Patient had reportedly improved previously with diuresis, will increase.  Diffuse interstitial changes consistent with pneumonitis, etiology uncertain.  -Currently on Solu-Medrol 40 mg 3 times daily, will increase to 80 mg tid.    Nicotine abuse.  Date: 04/10/2017  MRN# [id number] Marco [name] Feb 21, 1955   Marco [name] is a 62 y.o. old male seen in follow up for chief complaint of  Chief Complaint  Patient presents with  . Respiratory Distress     HPI:  Patient is a 62 year old male, who was previously admitted to the hospital, found to have bilateral pneumonia with possible ARDS, subsequently discharged with antibiotics, on 04/01/17, presented back to the hospital on 4/6 from [facility] health care with respiratory distress, requiring BiPAP.  Images personally reviewed, review of current chest x-ray from 04/08/17 comparison with previous on 3/26, balance the interstitial changes appear for the most part similar, there are also similar bilateral moderate pleural effusions.   Medication:    Current Facility-Administered Medications:  .  acetaminophen (TYLENOL) tablet 650 mg, 650 mg, Oral, Q6H PRN **OR** acetaminophen (TYLENOL) suppository 650 mg, 650 mg, Rectal, Q6H PRN, Enedina Finner, MD .  albuterol (PROVENTIL) (2.5 MG/3ML) 0.083% nebulizer solution 2.5 mg, 2.5 mg, Nebulization, Q4H PRN, Gouru, Aruna, MD, 2.5 mg at 04/07/17 1501 .  budesonide (PULMICORT) nebulizer solution 0.5 mg, 0.5 mg, Nebulization, Q12H, Kasa, Kurian, MD, 0.5 mg at 04/10/17 0810 .  enoxaparin (LOVENOX) injection 40 mg, 40 mg, Subcutaneous, Q24H, Shaune Pollack, MD, 40 mg at 04/09/17 1713 .   ferrous sulfate tablet 325 mg, 325 mg, Oral, BID WC, Tukov, Magadalene S, NP, 325 mg at 04/10/17 0846 .  folic acid (FOLVITE) tablet 1 mg, 1 mg, Oral, Daily, Merwyn Katos, MD, 1 mg at 04/10/17 0847 .  ipratropium-albuterol (DUONEB) 0.5-2.5 (3) MG/3ML nebulizer solution 3 mL, 3 mL, Nebulization, Q4H, Kasa, Kurian, MD, 3 mL at 04/10/17 1153 .  levETIRAcetam (KEPPRA) 100 MG/ML solution 750 mg, 750 mg, Oral, BID, Hallaji, Sheema M, RPH, 750 mg at 04/10/17 0917 .  magnesium sulfate IVPB 2 g 50 mL, 2 g, Intravenous, Once, Gouru, Aruna, MD, Last Rate: 50 mL/hr at 04/10/17 1200, 2 g at 04/10/17 1200 .  MEDLINE mouth rinse, 15 mL, Mouth Rinse, BID, Kasa, Kurian, MD .  methylPREDNISolone sodium succinate (SOLU-MEDROL) 40 mg/mL injection 40 mg, 40 mg, Intravenous, TID, Gouru, Aruna, MD .  metoprolol tartrate (LOPRESSOR) tablet 12.5 mg, 12.5 mg, Oral, BID, Enedina Finner, MD, 12.5 mg at 04/10/17 0847 .  multivitamin with minerals tablet 1 tablet, 1 tablet, Oral, Daily, Enedina Finner, MD, 1 tablet at 04/10/17 0847 .  nicotine (NICODERM CQ - dosed in mg/24 hours) patch 21 mg, 21 mg, Transdermal, Daily, Enedina Finner, MD, 21 mg at 04/10/17 0854 .  ondansetron (ZOFRAN) tablet 4 mg, 4 mg, Oral, Q6H PRN **OR** ondansetron (ZOFRAN) injection 4 mg, 4 mg, Intravenous, Q6H PRN, Enedina Finner, MD .  pantoprazole (PROTONIX) EC tablet 40 mg, 40 mg, Oral, Daily, Enedina Finner, MD, 40 mg at 04/10/17 0846 .  polyethylene glycol (MIRALAX / GLYCOLAX) packet 17 g, 17 g, Oral, Daily, Tukov, Magadalene S, NP, 17 g at 04/09/17 1056 .  senna-docusate (Senokot-S) tablet  2 tablet, 2 tablet, Oral, BID, Erin FullingKasa, Kurian, MD, 2 tablet at 04/10/17 0846 .  tamsulosin (FLOMAX) capsule 0.4 mg, 0.4 mg, Oral, Daily, Enedina FinnerPatel, Sona, MD, 0.4 mg at 04/10/17 0846 .  thiamine (VITAMIN B-1) tablet 100 mg, 100 mg, Oral, Daily, Enedina FinnerPatel, Sona, MD, 100 mg at 04/10/17 0846 .  vitamin B-12 (CYANOCOBALAMIN) tablet 2,000 mcg, 2,000 mcg, Oral, Daily, Enedina FinnerPatel, Sona, MD, 2,000  mcg at 04/10/17 [id number]   Allergies:  Hydrocodone-acetaminophen  Review of Systems: Gen:  Denies  fever, sweats. HEENT: Denies blurred vision. Cvc:  No dizziness, chest pain or heaviness Resp:   Denies cough or sputum porduction. Other:  All other systems were reviewed and found to be negative other than what is mentioned in the HPI.   Physical Examination:   VS: BP 129/72 (BP Location: Left Arm)   Pulse 99   Temp (!) 97.5 F (36.4 C) (Oral)   Resp (!) 25   Ht 5\' 6"  (1.676 m)   Wt 151 lb (68.5 kg)   SpO2 99%   BMI 24.37 kg/m    General Appearance: Patient lethargic, but arousable currently on BiPAP. Neuro:without focal findings,  speech normal,  HEENT: PERRLA, EOM intact. Pulmonary: Bilateral coarse rhonchi.,  CardiovascularNormal S1,S2.  No m/r/g.   Abdomen: Benign, Soft, non-tender. Renal:  No costovertebral tenderness  GU:  Not performed at this time. Endoc: No evident thyromegaly, no signs of acromegaly. Skin:   warm, no rash. Extremities: normal, no cyanosis, clubbing.   LABORATORY PANEL:   CBC Recent Labs  Lab 04/09/17 0436  WBC 9.1  HGB 7.8*  HCT 23.9*  PLT 285   ------------------------------------------------------------------------------------------------------------------  Chemistries  Recent Labs  Lab 04/08/17 0533 04/09/17 0436 04/10/17 0636  NA 142 139 139  K 4.4 4.4 4.3  CL 111 108 105  CO2 26 26 31   GLUCOSE 155* 137* 99  BUN 20 23* 24*  CREATININE 0.74 0.80 0.65  CALCIUM 8.8* 9.1 8.9  MG 2.2  --   --   AST  --  17  --   ALT  --  13*  --   ALKPHOS  --  74  --   BILITOT  --  0.7  --    ------------------------------------------------------------------------------------------------------------------  Cardiac Enzymes Recent Labs  Lab 04/06/17 2229  TROPONINI <0.03   ------------------------------------------------------------  RADIOLOGY:   No results found for this or any previous visit. Results for orders placed during the  hospital encounter of 03/25/17  DG Chest 2 View   Narrative CLINICAL DATA:  SOB  EXAM: CHEST - 2 VIEW  COMPARISON:  03/27/2017  FINDINGS: Bilateral diffuse interstitial and alveolar airspace opacities. No pleural effusion or pneumothorax. Normal cardiomediastinal silhouette. No acute osseous abnormality.  IMPRESSION: 1. Bilateral diffuse interstitial and alveolar airspace opacities which may reflect pulmonary edema versus multilobar pneumonia.   Electronically Signed   By: Elige KoHetal  Patel   On: 03/28/2017 10:34    ------------------------------------------------------------------------------------------------------------------  Thank  you [facility] to assist in the care of your patient. Our recommendations are noted above.  Please contact us if we can be of further service.   Wells Guileseep Jeneal Vogl, MD.  [place] [facility] Office Number: [phone]  Santiago Gladavid Kasa, M.D.  Billy Fischeravid Simonds, M.D  04/10/2017

## 2017-04-10 NOTE — Plan of Care (Signed)
Pt on HFNC with O2 sats in the high 90's. VSS. Breathing improved and seems less labored. Intermittent confusion. Pt is more awake.

## 2017-04-10 NOTE — Progress Notes (Signed)
PT Hold Note  Patient Details Name: Marco [name] MRN: [id number] DOB: 22-Feb-1955   Hold Treatment:    Reason Eval/Treat Not Completed: Medical issues which prohibited therapy. Chart reviewed and phone call with RN. Per RN pt now on Marco [name] and this is not a good time for him to work with therapy. Per pulmonology note pt will be placed on Marco [name] and if needed transferred back onto BiPaP. Will hold PT treatment at this time and attempt to see patient on different date as appropriate.  Sharalyn InkJason D Sher Shampine PT, DPT   Calli Bashor 04/10/2017, 4:35 PM

## 2017-04-10 NOTE — Progress Notes (Signed)
Per MD patient will need a Bipap. Per respiratory Bipap settings are 10/5 at 35%. Clinical Child psychotherapistocial Worker (CSW) made [employer] above. Per Tresa EndoKelly she will work on getting patient a Bipap.   Baker Hughes IncorporatedBailey Ozella Comins, LCSW [phone]

## 2017-04-10 NOTE — Progress Notes (Signed)
Pharmacy Electrolyte Monitoring Consult:  Pharmacy consulted to assist in monitoring and replacing electrolytes in this 62 y.o. male admitted on 04/06/2017 with acute on chronic respiratory failure.   Labs:  Sodium (mmol/L)  Date Value  04/10/2017 139  11/11/2013 138   Potassium (mmol/L)  Date Value  04/10/2017 4.3  11/11/2013 4.1   Magnesium (mg/dL)  Date Value  16/10/960404/08/2017 2.2   Phosphorus (mg/dL)  Date Value  54/09/811904/08/2017 4.1   Calcium (mg/dL)  Date Value  14/78/295604/10/2017 8.9   Calcium, Total (mg/dL)  Date Value  21/30/865711/11/2013 8.5   Albumin (g/dL)  Date Value  84/69/629504/09/2017 2.5 (L)  12/17/2012 3.4    Plan: Electrolytes WNL for several days. Will continue to follow due to continued diuretic use, but will reduce monitoring to every other day.   Luisa HartChristy, Amayrani Bennick D 04/10/2017 10:47 AM

## 2017-04-10 NOTE — Care Management Important Message (Signed)
Important Message  Patient Details  Name: Marco [name] MRN: [name] Date of Birth: [birth year]   Medicare Important Message Given:  Yes    Olegario MessierKathy A Delissa Silba 04/10/2017, 11:03 AM

## 2017-04-10 NOTE — Progress Notes (Signed)
During assessment noted pt with dyspnea at rest, uses accessory muscles, VSS, O2 sats 99% on 3L O2 per [place], RR 26. Pt awake and follow commands, but lethargic, seems very tired. Dr Amado CoeGouru present in pt's room, lasix given and Bipap applied by RT.

## 2017-04-10 NOTE — Progress Notes (Signed)
Pt has had spells of confusion tonight with continued work of breathing issues.  Discussed with MD and it was decided to try Bipap at night.  Pt refused bipap this evening.

## 2017-04-10 NOTE — Progress Notes (Signed)
Noted pt off the BiPAP, pt removed BiPAP and refuses to have it back on. Pt awake but lethargic. VSS. Pt still dyspneic at rest and uses accessory muscles. 3L O2 per [place] applied by RT. Dr Amado CoeGouru notified, ABG's ordered.

## 2017-04-11 LAB — CULTURE, BLOOD (ROUTINE X 2)
CULTURE: NO GROWTH
CULTURE: NO GROWTH
SPECIAL REQUESTS: ADEQUATE
SPECIAL REQUESTS: ADEQUATE

## 2017-04-11 MED ORDER — LORAZEPAM BOLUS VIA INFUSION: 1.0000 mg | Freq: Once | INTRAVENOUS | Status: DC

## 2017-04-11 MED ORDER — LORAZEPAM 1 MG PO TABS
1.0000 mg | ORAL_TABLET | Freq: Three times a day (TID) | ORAL | Status: DC | PRN
  Administered 2017-04-11 – 2017-04-13 (×4): 1 mg via ORAL
  Filled 2017-04-11 (×4): qty 1

## 2017-04-11 MED ORDER — LORAZEPAM 2 MG/ML IJ SOLN
1.0000 mg | Freq: Once | INTRAMUSCULAR | Status: AC
  Administered 2017-04-11: 1 mg via INTRAVENOUS
  Filled 2017-04-11: qty 1

## 2017-04-11 MED ORDER — ALBUTEROL SULFATE (2.5 MG/3ML) 0.083% IN NEBU
2.5000 mg | INHALATION_SOLUTION | RESPIRATORY_TRACT | Status: DC
  Administered 2017-04-11 – 2017-04-14 (×13): 2.5 mg via RESPIRATORY_TRACT
  Filled 2017-04-11 (×14): qty 3

## 2017-04-11 NOTE — Plan of Care (Signed)

## 2017-04-11 NOTE — Progress Notes (Signed)
[facility]     Assessment and Plan:  Acute respiratory failure, hypoxic. -Continue elevated work of breathing with tachypnea.  --d/w respiratory, will restart pt on hi-flow, continue to help with work of breathing.   Diffuse interstitial changes consistent with pneumonitis, etiology uncertain.  -Currently on Solu-Medrol 80 mg 3 times daily, and lasix 40 bid.  --Monitor renal function.  --Will need bronchoscopy and/or VATS lung biopsy when respiratory status more stable. D/w wife at bedside.  --Nebs increased to q4.  --Patient appears dyspneic today and remains at high risk of decompensation.   Nicotine abuse. --Smoking cessation.   Date: 04/11/2017  MRN# [id number] Marco [name] 1955/05/21   Marco [name] is a 62 y.o. old male seen in follow up for chief complaint of  Chief Complaint  Patient presents with  . Respiratory Distress     HPI:  Patient is a 62 year old male, who was previously admitted to the hospital, found to have bilateral pneumonia with possible ARDS, subsequently discharged with antibiotics, on 04/01/17, presented back to the hospital on 4/6 from [facility] health care with respiratory distress, requiring BiPAP.  Images personally reviewed, review of current chest x-ray from 04/08/17 comparison with previous on 3/26, balance the interstitial changes appear for the most part similar, there are also similar bilateral moderate pleural effusions.  Yesterday the patient was lethargic on bipap, his lasix was increased to 40 IV q 12, solumedrol was increased to 80 mg q8 and he was switched to hi-flow. Currently the patient remains on [place], he is awake[facility] He responds to questions.   Medication:    Current Facility-Administered Medications:  .  acetaminophen (TYLENOL) tablet 650 mg, 650 mg, Oral, Q6H PRN **OR** acetaminophen (TYLENOL) suppository 650 mg, 650 mg, Rectal, Q6H PRN, Enedina FinnerPatel, Sona, MD .  albuterol (PROVENTIL) (2.5 MG/3ML) 0.083%  nebulizer solution 2.5 mg, 2.5 mg, Nebulization, Q4H PRN, Gouru, Aruna, MD, 2.5 mg at 04/07/17 1501 .  budesonide (PULMICORT) nebulizer solution 0.5 mg, 0.5 mg, Nebulization, Q12H, Kasa, Kurian, MD, 0.5 mg at 04/11/17 0813 .  enoxaparin (LOVENOX) injection 40 mg, 40 mg, Subcutaneous, Q24H, Shaune Pollackhen, Qing, MD, 40 mg at 04/10/17 1806 .  ferrous sulfate tablet 325 mg, 325 mg, Oral, BID WC, Tukov, Magadalene S, NP, 325 mg at 04/11/17 0905 .  folic acid (FOLVITE) tablet 1 mg, 1 mg, Oral, Daily, Merwyn KatosSimonds, David B, MD, 1 mg at 04/11/17 0906 .  furosemide (LASIX) injection 40 mg, 40 mg, Intravenous, Q12H, Shane Crutchamachandran, Kristee Angus, MD, 40 mg at 04/10/17 1806 .  ipratropium-albuterol (DUONEB) 0.5-2.5 (3) MG/3ML nebulizer solution 3 mL, 3 mL, Nebulization, Q4H, Kasa, Kurian, MD, 3 mL at 04/11/17 1305 .  levETIRAcetam (KEPPRA) 100 MG/ML solution 750 mg, 750 mg, Oral, BID, Hallaji, Sheema M, RPH, 750 mg at 04/11/17 0914 .  LORazepam (ATIVAN) tablet 1 mg, 1 mg, Oral, Q8H PRN, Gouru, Aruna, MD .  MEDLINE mouth rinse, 15 mL, Mouth Rinse, BID, Kasa, Kurian, MD, 15 mL at 04/10/17 2125 .  methylPREDNISolone sodium succinate (SOLU-MEDROL) 125 mg/2 mL injection 80 mg, 80 mg, Intravenous, TID, Shane Crutchamachandran, Kem Parcher, MD, 80 mg at 04/11/17 0906 .  metoprolol tartrate (LOPRESSOR) tablet 12.5 mg, 12.5 mg, Oral, BID, Enedina FinnerPatel, Sona, MD, 12.5 mg at 04/11/17 0905 .  multivitamin with minerals tablet 1 tablet, 1 tablet, Oral, Daily, Enedina FinnerPatel, Sona, MD, 1 tablet at 04/11/17 0905 .  nicotine (NICODERM CQ - dosed in mg/24 hours) patch 21 mg, 21 mg, Transdermal, Daily, Enedina FinnerPatel, Sona, MD, 21 mg at 04/11/17  [id number] .  ondansetron (ZOFRAN) tablet 4 mg, 4 mg, Oral, Q6H PRN **OR** ondansetron (ZOFRAN) injection 4 mg, 4 mg, Intravenous, Q6H PRN, Enedina Finner, MD .  pantoprazole (PROTONIX) EC tablet 40 mg, 40 mg, Oral, Daily, Enedina Finner, MD, 40 mg at 04/11/17 0905 .  polyethylene glycol (MIRALAX / GLYCOLAX) packet 17 g, 17 g, Oral, Daily, Tukov, Magadalene S,  NP, 17 g at 04/11/17 0902 .  senna-docusate (Senokot-S) tablet 2 tablet, 2 tablet, Oral, BID, Erin Fulling, MD, 2 tablet at 04/11/17 0905 .  tamsulosin (FLOMAX) capsule 0.4 mg, 0.4 mg, Oral, Daily, Enedina Finner, MD, 0.4 mg at 04/11/17 0906 .  thiamine (VITAMIN B-1) tablet 100 mg, 100 mg, Oral, Daily, Enedina Finner, MD, 100 mg at 04/11/17 0905 .  vitamin B-12 (CYANOCOBALAMIN) tablet 2,000 mcg, 2,000 mcg, Oral, Daily, Enedina Finner, MD, 2,000 mcg at 04/11/17 4696   Allergies:  Hydrocodone-acetaminophen  Review of Systems: Gen:  Denies  fever, sweats. HEENT: Denies blurred vision. Cvc:  No dizziness, chest pain or heaviness Resp:   Denies cough or sputum porduction. Other:  All other systems were reviewed and found to be negative other than what is mentioned in the HPI.   Physical Examination:   VS: BP 136/88 (BP Location: Right Arm)   Pulse 94   Temp 97.9 F (36.6 C)   Resp 20   Ht 5\' 6"  (1.676 m)   Wt 151 lb (68.5 kg)   SpO2 95%   BMI 24.37 kg/m    General Appearance: Patient appears dyspneic with accessory muscle use.  Neuro:without focal findings,  speech normal,  HEENT: PERRLA, EOM intact. Pulmonary: Bilateral coarse rhonchi and wheezing.  CardiovascularNormal S1,S2.  No m/r/g.   Abdomen: Benign, Soft, non-tender. Renal:  No costovertebral tenderness  GU:  Not performed at this time. Endoc: No evident thyromegaly, no signs of acromegaly. Skin:   warm, no rash. Extremities: normal, no cyanosis, clubbing.   LABORATORY PANEL:   CBC Recent Labs  Lab 04/09/17 0436  WBC 9.1  HGB 7.8*  HCT 23.9*  PLT 285   ------------------------------------------------------------------------------------------------------------------  Chemistries  Recent Labs  Lab 04/08/17 0533 04/09/17 0436 04/10/17 0636  NA 142 139 139  K 4.4 4.4 4.3  CL 111 108 105  CO2 26 26 31   GLUCOSE 155* 137* 99  BUN 20 23* 24*  CREATININE 0.74 0.80 0.65  CALCIUM 8.8* 9.1 8.9  MG 2.2  --   --     AST  --  17  --   ALT  --  13*  --   ALKPHOS  --  74  --   BILITOT  --  0.7  --    ------------------------------------------------------------------------------------------------------------------  Cardiac Enzymes Recent Labs  Lab 04/06/17 2229  TROPONINI <0.03   ------------------------------------------------------------  RADIOLOGY:   No results found for this or any previous visit. Results for orders placed during the hospital encounter of 03/25/17  DG Chest 2 View   Narrative CLINICAL DATA:  SOB  EXAM: CHEST - 2 VIEW  COMPARISON:  03/27/2017  FINDINGS: Bilateral diffuse interstitial and alveolar airspace opacities. No pleural effusion or pneumothorax. Normal cardiomediastinal silhouette. No acute osseous abnormality.  IMPRESSION: 1. Bilateral diffuse interstitial and alveolar airspace opacities which may reflect pulmonary edema versus multilobar pneumonia.   Electronically Signed   By: Elige Ko   On: 03/28/2017 10:34    ------------------------------------------------------------------------------------------------------------------  Thank  you for allowing [facility] to assist in the care of your patient. Our recommendations are  noted above.  Please contact us if we can be of further service.   Wells Guiles, MD.  [place] [facility] Office Number: [phone]  Santiago Glad, M.D.  Billy Fischer, M.D  04/11/2017

## 2017-04-11 NOTE — Progress Notes (Signed)
[facility] [place] [facility]   PATIENT NAME: Marco [name]    MR#:  [id number]  DATE OF BIRTH:  09-Oct-1955  SUBJECTIVE:  CHIEF COMPLAINT:   The patient's shortness of breath is better today but patient is confused.  He thinks he is in the laundry room for the past several hours.  Very agitated during my examination.  No family members at bedside. REVIEW OF SYSTEMS:  review of system unobtainable as the patient is altered and agitated DRUG ALLERGIES:   Allergies  Allergen Reactions  . Hydrocodone-Acetaminophen Nausea Only and Nausea And Vomiting    VITALS:  Blood pressure 115/80, pulse 87, temperature 97.6 F (36.4 C), temperature source Oral, resp. rate 20, height 5\' 6"  (1.676 m), weight 68.5 kg (151 lb), SpO2 96 %.  PHYSICAL EXAMINATION:  GENERAL:  62 y.o.-year-old patient lying in the bed with no acute distress.  EYES: Pupils equal, round, reactive to light and accommodation. No scleral icterus. Extraocular muscles intact.  HEENT: Head atraumatic, normocephalic. Oropharynx and nasopharynx clear.  NECK:  Supple, no jugular venous distention. No thyroid enlargement, no tenderness.  LUNGS:  moderate breath sounds bilaterally, normal course rhonchi and wheezing,   no use of accessory muscles of respiration.  CARDIOVASCULAR: S1, S2 normal. No murmurs, rubs, or gallops.  ABDOMEN: Soft, nontender, nondistended. Bowel sounds present.  EXTREMITIES: No pedal edema, cyanosis, or clubbing.  NEUROLOGIC: Patient is altered and disoriented  pSYCHIATRIC: The patient is alert and disoriented SKIN: Has bruises on his extremities no obvious rash, lesion, or ulcer.    LABORATORY PANEL:   CBC Recent Labs  Lab 04/09/17 0436  WBC 9.1  HGB 7.8*  HCT 23.9*  PLT 285   ------------------------------------------------------------------------------------------------------------------  Chemistries  Recent Labs  Lab 04/08/17 0533 04/09/17 0436 04/10/17 0636   NA 142 139 139  K 4.4 4.4 4.3  CL 111 108 105  CO2 26 26 31   GLUCOSE 155* 137* 99  BUN 20 23* 24*  CREATININE 0.74 0.80 0.65  CALCIUM 8.8* 9.1 8.9  MG 2.2  --   --   AST  --  17  --   ALT  --  13*  --   ALKPHOS  --  74  --   BILITOT  --  0.7  --    ------------------------------------------------------------------------------------------------------------------  Cardiac Enzymes Recent Labs  Lab 04/06/17 2229  TROPONINI <0.03   ------------------------------------------------------------------------------------------------------------------  RADIOLOGY:  Dg Chest Port 1 View  Result Date: 04/10/2017 CLINICAL DATA:  Shortness of breath, on BiPAP, current smoker. Acute on chronic respiratory failure EXAM: PORTABLE CHEST 1 VIEW COMPARISON:  Chest x-ray of April 09, 2017 and April 06, 2017. FINDINGS: The lungs are well-expanded. There are persistent interstitial and patchy airspace opacities. These have not appreciably changed since yesterday's study. The heart is normal in size. The pulmonary vascularity is not clearly engorged. There is no pleural effusion. IMPRESSION: Bilateral interstitial and alveolar infiltrates most compatible with pneumonia. These infiltrates have decreased slightly in conspicuity since the April 06, 2017 study. Electronically Signed   By: David  [place] M.D.   On: 04/10/2017 13:35    EKG:   Orders placed or performed during the hospital encounter of 04/06/17  . ED EKG 12-Lead  . ED EKG 12-Lead  . EKG 12-Lead  . EKG 12-Lead  . EKG 12-Lead  . EKG 12-Lead    ASSESSMENT AND PLAN:    Marco [name]  is a 62 y.o. male with a known history of smoking, hypertension, history  of seizure disorder, COPD recently admitted with hypoxic respiratory failure remained on BiPAP chest x-rays suggestive of ARDS/pneumonia was [facility] five days ago comes back with shortness of breath and lethargic.  1.  Acute encephalopathy from acute on chronic  hypoxic respiratory failure with hypoxia secondary to COPD exacerbation, with pneumonitis-  Unclear etiology Off BiPAP today  On high flow oxygen Bronchodilator therapy and supportive treatment Continue Solu-Medrol 80 mg IV 3 times a day and Lasix 40 mg IV twice daily  Pulmonology Dr. Nicholos Johnsamachandran is following and recommending bronchoscopy and/or VATS lung biopsy when respiratory status more stable -Pro calcitonin is <0.1 and lactic acidosis improved.  Dr. Lonn Georgiaonforti  recommending connective tissue studies.  Patient may need open lung biopsy for further characterization to rule out cryptogenic organizing pneumonia, hypersensitivity, diffuse alveolar damage, DIP.  2. hypertension continue home meds  3. Seizure disorder, continue Keppra  4.  Anemia of chronic disease.  Hemoglobin is stable.  Tobacco abuse.  Smoking cessation was counseled by Dr. Imogene Burnhen   All the records are reviewed and case discussed with Care Management/Social Workerr. Management plans discussed with the patient, his wife and they are in agreement.  Generalized weakness.  PT evaluation suggest skilled nursing facility placement. CODE STATUS: Full code  TOTAL CRITICAL CARE CARE TIME TAKING CARE OF THIS PATIENT: 36 minutes.   POSSIBLE D/C IN 2-3 DAYS, DEPENDING ON CLINICAL CONDITION.  Note: This dictation was prepared with Dragon dictation along with smaller phrase technology. Any transcriptional errors that result from this process are unintentional.   Ramonita LabAruna Patrizia Paule M.D on 04/11/2017 at 6:43 PM  Between 7am to 6pm - Pager - [phone] After 6pm go to [email or web address] - password [email or web address] [facility]  [facility]  Office  [phone]  CC: Primary care physician; Jerl MinaHedrick, James, MD

## 2017-04-11 NOTE — Progress Notes (Signed)
Physical Therapy Treatment Patient Details Name: Marco [name] MRN: [id number] DOB: 03/29/1955 Today's Date: 04/11/2017    History of Present Illness Marco [name] is a 62 y.o. male with a history of smoking, HTN, and seizure disorder who was recently discharged from the hospital 5 days ago after being admittedHCAP/ ARDS who presents for evaluation of shortness of breath. Patient reports that he never felt better when he went home. Started feeling worse over the last 24 hours. Patient continues to have cough and fever at home. No vomiting or diarrhea. He is complaining of severe constant shortness of breath. No chest pain, no abdominal pain. He endorses compliance with his antibiotics at home. Patient was hypoxic to 85% per EMS. Patient was given 2 albuterol treatments, one duoneb, and 125 mg of Solu-Medrol per EMS. Pt is now admitted for acute on chronic respiratory failure with hypoxia secondary to COPD exacerbation.    PT Comments    Pt exceedingly somnolent today. Struggles to follow commands or keep his eyes open. He is unable to safely perform bed mobility, transfers, or attempt ambulation. Per RN pt has been agitated this morning so he was recently sedated. He completes supine exercises but with heavy verbal and tactile cues. Pt frequently falls asleep and has to be aroused to resume exercises. Will continue to progress mobility in future sessions as pt tolerates. Pt will benefit from PT services to address deficits in strength, balance, and mobility in order to return to full function at home.    Follow Up Recommendations  SNF     Equipment Recommendations  Other (comment)(May need a rolling walker. Unclear)    Recommendations for Other Services       Precautions / Restrictions Precautions Precautions: Fall Restrictions Weight Bearing Restrictions: No    Mobility  Bed Mobility               General bed mobility comments: Pt exceedingly somnolent today. Struggles to  follow commands or keep his eyes open. He is unable to safely perform bed mobility, transfers, or attempt ambulation. Per RN pt has been agitated this morning so he was recently sedated  Transfers                    Ambulation/Gait                 Stairs            Wheelchair Mobility    Modified Rankin (Stroke Patients Only)       Balance                                            Cognition Arousal/Alertness: Lethargic Behavior During Therapy: Flat affect Overall Cognitive Status: Impaired/Different from baseline Area of Impairment: Orientation                 Orientation Level: Disoriented to;Time;Situation;Place             General Comments: Pt exceedingly somnolent today. Struggles to follow commands or keep his eyes open      Exercises General Exercises - Lower Extremity Ankle Circles/Pumps: Both;10 reps Short Arc Quad: Both;10 reps Heel Slides: Both;10 reps Hip ABduction/ADduction: Both;10 reps Straight Leg Raises: Both;10 reps    General Comments        Pertinent Vitals/Pain Pain Assessment: Faces Faces Pain Scale: No hurt    Home  Living                      Prior Function            PT Goals (current goals can now be found in the care plan section) Acute Rehab PT Goals Patient Stated Goal: Return to rehab PT Goal Formulation: With patient Time For Goal Achievement: 04/22/17 Potential to Achieve Goals: Good Progress towards PT goals: Not progressing toward goals - comment(Extremely lethargic and somnolent today)    Frequency    Min 2X/week      PT Plan Current plan remains appropriate    Co-evaluation              AM-PAC PT "6 Clicks" Daily Activity  Outcome Measure  Difficulty turning over in bed (including adjusting bedclothes, sheets and blankets)?: Unable Difficulty moving from lying on back to sitting on the side of the bed? : Unable Difficulty sitting down on  and standing up from a chair with arms (e.g., wheelchair, bedside commode, etc,.)?: Unable Help needed moving to and from a bed to chair (including a wheelchair)?: Total Help needed walking in hospital room?: Total Help needed climbing 3-5 steps with a railing? : Total 6 Click Score: 6    End of Session Equipment Utilized During Treatment: Gait belt;Oxygen;Other (comment)(6L/min via [place]) Activity Tolerance: Patient limited by fatigue;Patient limited by lethargy Patient left: with call bell/phone within reach;in bed;with bed alarm set   PT Visit Diagnosis: Muscle weakness (generalized) (M62.81);Difficulty in walking, not elsewhere classified (R26.2)     Time: 1610-9604 PT Time Calculation (min) (ACUTE ONLY): 9 min  Charges:  $Therapeutic Exercise: 8-22 mins                    G Codes:       Marco [name] PT, DPT     Marco [name] 04/11/2017, 10:44 AM

## 2017-04-11 NOTE — Progress Notes (Signed)
[facility] they have the bipap for patient.   [employer] Hughes IncorporatedBailey Carlena Ruybal, LCSW [phone]

## 2017-04-11 NOTE — Plan of Care (Signed)

## 2017-04-11 NOTE — Progress Notes (Signed)
Pt refused to keep HFNC in nose, placed 5lpm [place], sats 96%. RN aware, will continue monitor.

## 2017-04-12 ENCOUNTER — Inpatient Hospital Stay: Payer: Medicare Other

## 2017-04-12 LAB — BASIC METABOLIC PANEL
Anion gap: 5 (ref 5–15)
BUN: 20 mg/dL (ref 6–20)
CHLORIDE: 98 mmol/L — AB (ref 101–111)
CO2: 39 mmol/L — ABNORMAL HIGH (ref 22–32)
Calcium: 9 mg/dL (ref 8.9–10.3)
Creatinine, Ser: 0.48 mg/dL — ABNORMAL LOW (ref 0.61–1.24)
Glucose, Bld: 129 mg/dL — ABNORMAL HIGH (ref 65–99)
POTASSIUM: 4.2 mmol/L (ref 3.5–5.1)
Sodium: 142 mmol/L (ref 135–145)

## 2017-04-12 LAB — AMMONIA: Ammonia: 16 umol/L (ref 9–35)

## 2017-04-12 NOTE — Progress Notes (Signed)
Pharmacy Electrolyte Monitoring Consult:  Pharmacy consulted to assist in monitoring and replacing electrolytes in this 62 y.o. male admitted on 04/06/2017 with acute on chronic respiratory failure.   Labs:  Sodium (mmol/L)  Date Value  04/12/2017 142  11/11/2013 138   Potassium (mmol/L)  Date Value  04/12/2017 4.2  11/11/2013 4.1   Magnesium (mg/dL)  Date Value  11/91/478204/08/2017 2.2   Phosphorus (mg/dL)  Date Value  95/62/130804/08/2017 4.1   Calcium (mg/dL)  Date Value  65/78/469604/12/2017 9.0   Calcium, Total (mg/dL)  Date Value  29/52/841311/11/2013 8.5   Albumin (g/dL)  Date Value  24/40/102704/09/2017 2.5 (L)  12/17/2012 3.4    Plan: Electrolytes WNL for several days. Will continue to follow due to continued diuretic use, but will reduce monitoring to every other day.   Marco [name], Marco [name] D 04/12/2017 10:55 AM

## 2017-04-12 NOTE — Progress Notes (Signed)
[facility]     Assessment and Plan:  Acute respiratory failure, hypoxic. -Continues to have elevated work of breathing with tachypnea, though appears improved from yesterday..  -- off hi-flow yesterday evening, now on [place] at 2L.   Diffuse interstitial changes consistent with pneumonitis, etiology uncertain.  -Currently on Solu-Medrol 80 mg 3 times daily, and lasix 40 bid. Creatinine stable today.  --Monitor renal function.  --Will need bronchoscopy and/or VATS lung biopsy when respiratory status more stable.  --Continue Nebs q4.  --Patient appears improved today, but continues to have tachypnea. Continue steroids, diuresis.   Nicotine abuse. --Smoking cessation.   Date: 04/12/2017  MRN# [id number] Kamau Weatherall 04-Mar-1955   Couper Juncaj is a 62 y.o. old male seen in follow up for chief complaint of  Chief Complaint  Patient presents with  . Respiratory Distress     Subjective Pt confused today. No new complaints, appears tachypneic but better than yesterday.  Imaging personally reviewed, CXR today, not significantly changed from previous.   Medication:    Current Facility-Administered Medications:  .  acetaminophen (TYLENOL) tablet 650 mg, 650 mg, Oral, Q6H PRN **OR** acetaminophen (TYLENOL) suppository 650 mg, 650 mg, Rectal, Q6H PRN, Enedina Finner, MD .  albuterol (PROVENTIL) (2.5 MG/3ML) 0.083% nebulizer solution 2.5 mg, 2.5 mg, Nebulization, Q4H, Nicholos Johns, Lateisha Thurlow, MD, 2.5 mg at 04/12/17 1224 .  budesonide (PULMICORT) nebulizer solution 0.5 mg, 0.5 mg, Nebulization, Q12H, Kasa, Kurian, MD, 0.5 mg at 04/12/17 0759 .  enoxaparin (LOVENOX) injection 40 mg, 40 mg, Subcutaneous, Q24H, Shaune Pollack, MD, 40 mg at 04/11/17 1742 .  ferrous sulfate tablet 325 mg, 325 mg, Oral, BID WC, Tukov, Magadalene S, NP, 325 mg at 04/12/17 0935 .  folic acid (FOLVITE) tablet 1 mg, 1 mg, Oral, Daily, Merwyn Katos, MD, 1 mg at 04/12/17 0934 .  furosemide (LASIX)  injection 40 mg, 40 mg, Intravenous, Q12H, Shane Crutch, MD, 40 mg at 04/12/17 0624 .  levETIRAcetam (KEPPRA) 100 MG/ML solution 750 mg, 750 mg, Oral, BID, Hallaji, Sheema M, RPH, 750 mg at 04/12/17 1012 .  LORazepam (ATIVAN) tablet 1 mg, 1 mg, Oral, Q8H PRN, Gouru, Aruna, MD, 1 mg at 04/12/17 1202 .  MEDLINE mouth rinse, 15 mL, Mouth Rinse, BID, Kasa, Kurian, MD, 15 mL at 04/12/17 1113 .  methylPREDNISolone sodium succinate (SOLU-MEDROL) 125 mg/2 mL injection 80 mg, 80 mg, Intravenous, TID, Shane Crutch, MD, 80 mg at 04/12/17 1013 .  metoprolol tartrate (LOPRESSOR) tablet 12.5 mg, 12.5 mg, Oral, BID, Enedina Finner, MD, 12.5 mg at 04/12/17 0941 .  multivitamin with minerals tablet 1 tablet, 1 tablet, Oral, Daily, Enedina Finner, MD, 1 tablet at 04/12/17 0934 .  nicotine (NICODERM CQ - dosed in mg/24 hours) patch 21 mg, 21 mg, Transdermal, Daily, Enedina Finner, MD, 21 mg at 04/12/17 1013 .  ondansetron (ZOFRAN) tablet 4 mg, 4 mg, Oral, Q6H PRN **OR** ondansetron (ZOFRAN) injection 4 mg, 4 mg, Intravenous, Q6H PRN, Enedina Finner, MD .  pantoprazole (PROTONIX) EC tablet 40 mg, 40 mg, Oral, Daily, Enedina Finner, MD, 40 mg at 04/12/17 0934 .  polyethylene glycol (MIRALAX / GLYCOLAX) packet 17 g, 17 g, Oral, Daily, Tukov, Magadalene S, NP, 17 g at 04/11/17 0902 .  senna-docusate (Senokot-S) tablet 2 tablet, 2 tablet, Oral, BID, Erin Fulling, MD, 2 tablet at 04/11/17 2238 .  tamsulosin (FLOMAX) capsule 0.4 mg, 0.4 mg, Oral, Daily, Enedina Finner, MD, 0.4 mg at 04/12/17 0935 .  thiamine (VITAMIN B-1) tablet 100 mg, 100  mg, Oral, Daily, Enedina FinnerPatel, Sona, MD, 100 mg at 04/12/17 0934 .  vitamin B-12 (CYANOCOBALAMIN) tablet 2,000 mcg, 2,000 mcg, Oral, Daily, Enedina FinnerPatel, Sona, MD, 2,000 mcg at 04/12/17 [id number]   Allergies:  Hydrocodone-acetaminophen  Review of Systems: Gen:  Denies  fever, sweats. HEENT: Denies blurred vision. Cvc:  No dizziness, chest pain or heaviness Resp:   Denies cough or sputum  porduction. Other:  All other systems were reviewed and found to be negative other than what is mentioned in the HPI.   Physical Examination:   VS: BP (!) 116/59 (BP Location: Left Arm)   Pulse (!) 105   Temp 98.4 F (36.9 C) (Oral)   Resp 20   Ht 5\' 6"  (1.676 m)   Wt 151 lb (68.5 kg)   SpO2 93%   BMI 24.37 kg/m    General Appearance: Patient appears dyspneic with accessory muscle use but better than yesterday.   Neuro:without focal findings,  speech normal,  HEENT: PERRLA, EOM intact. Pulmonary: Bilateral coarse rhonchi and wheezing, improved.  CardiovascularNormal S1,S2.  No m/r/g.   Abdomen: Benign, Soft, non-tender. Renal:  No costovertebral tenderness  GU:  Not performed at this time. Endoc: No evident thyromegaly, no signs of acromegaly. Skin:   warm, no rash. Extremities: normal, no cyanosis, clubbing.   LABORATORY PANEL:   CBC Recent Labs  Lab 04/09/17 0436  WBC 9.1  HGB 7.8*  HCT 23.9*  PLT 285   ------------------------------------------------------------------------------------------------------------------  Chemistries  Recent Labs  Lab 04/08/17 0533 04/09/17 0436  04/12/17 0435  NA 142 139   < > 142  K 4.4 4.4   < > 4.2  CL 111 108   < > 98*  CO2 26 26   < > 39*  GLUCOSE 155* 137*   < > 129*  BUN 20 23*   < > 20  CREATININE 0.74 0.80   < > 0.48*  CALCIUM 8.8* 9.1   < > 9.0  MG 2.2  --   --   --   AST  --  17  --   --   ALT  --  13*  --   --   ALKPHOS  --  74  --   --   BILITOT  --  0.7  --   --    < > = values in this interval not displayed.   ------------------------------------------------------------------------------------------------------------------  Cardiac Enzymes Recent Labs  Lab 04/06/17 2229  TROPONINI <0.03   ------------------------------------------------------------  RADIOLOGY:   No results found for this or any previous visit. Results for orders placed during the hospital encounter of 03/25/17  DG Chest 2 View    Narrative CLINICAL DATA:  SOB  EXAM: CHEST - 2 VIEW  COMPARISON:  03/27/2017  FINDINGS: Bilateral diffuse interstitial and alveolar airspace opacities. No pleural effusion or pneumothorax. Normal cardiomediastinal silhouette. No acute osseous abnormality.  IMPRESSION: 1. Bilateral diffuse interstitial and alveolar airspace opacities which may reflect pulmonary edema versus multilobar pneumonia.   Electronically Signed   By: Elige KoHetal  Patel   On: 03/28/2017 10:34    ------------------------------------------------------------------------------------------------------------------  Thank  you for [facility] to assist in the care of your patient. Our recommendations are noted above.  Please contact us if we can be of further service.   Wells Guileseep Sabir Charters, MD.  [facility] Office Number: [phone]  Santiago Gladavid Kasa, M.D.  Billy Fischeravid Simonds, M.D  04/12/2017

## 2017-04-12 NOTE — Progress Notes (Signed)
Patient will [facility] when medically stable[facility].   [employer]Bailey Johnnae Impastato, LCSW [phone]

## 2017-04-12 NOTE — Progress Notes (Signed)
[facility] [place] [facility]   PATIENT NAME: Marco [name]    MR#:  [id number]  DATE OF BIRTH:  [birth year]  SUBJECTIVE:  CHIEF COMPLAINT:   Chief Complaint  Patient presents with  . Respiratory Distress  continued confusion per staff, no events overnight  REVIEW OF SYSTEMS:  CONSTITUTIONAL: No fever, fatigue or weakness.  EYES: No blurred or double vision.  EARS, NOSE, AND THROAT: No tinnitus or ear pain.  RESPIRATORY: No cough, shortness of breath, wheezing or hemoptysis.  CARDIOVASCULAR: No chest pain, orthopnea, edema.  GASTROINTESTINAL: No nausea, vomiting, diarrhea or abdominal pain.  GENITOURINARY: No dysuria, hematuria.  ENDOCRINE: No polyuria, nocturia,  HEMATOLOGY: No anemia, easy bruising or bleeding SKIN: No rash or lesion. MUSCULOSKELETAL: No joint pain or arthritis.   NEUROLOGIC: No tingling, numbness, weakness.  PSYCHIATRY: No anxiety or depression.   ROS  DRUG ALLERGIES:   Allergies  Allergen Reactions  . Hydrocodone-Acetaminophen Nausea Only and Nausea And Vomiting    VITALS:  Blood pressure (!) 116/59, pulse (!) 105, temperature 98.4 F (36.9 C), temperature source Oral, resp. rate 20, height 5\' 6"  (1.676 m), weight 68.5 kg (151 lb), SpO2 93 %.  PHYSICAL EXAMINATION:  GENERAL:  62 y.o.-year-old patient lying in the bed with no acute distress.  EYES: Pupils equal, round, reactive to light and accommodation. No scleral icterus. Extraocular muscles intact.  HEENT: Head atraumatic, normocephalic. Oropharynx and nasopharynx clear.  NECK:  Supple, no jugular venous distention. No thyroid enlargement, no tenderness.  LUNGS: Normal breath sounds bilaterally, no wheezing, rales,rhonchi or crepitation. No use of accessory muscles of respiration.  CARDIOVASCULAR: S1, S2 normal. No murmurs, rubs, or gallops.  ABDOMEN: Soft, nontender, nondistended. Bowel sounds present. No organomegaly or mass.  EXTREMITIES: No pedal edema, cyanosis, or  clubbing.  NEUROLOGIC: Cranial nerves II through XII are intact. Muscle strength 5/5 in all extremities. Sensation intact. Gait not checked.  PSYCHIATRIC: The patient is alert and oriented x 3.  SKIN: No obvious rash, lesion, or ulcer.   Physical Exam LABORATORY PANEL:   CBC Recent Labs  Lab 04/09/17 0436  WBC 9.1  HGB 7.8*  HCT 23.9*  PLT 285   ------------------------------------------------------------------------------------------------------------------  Chemistries  Recent Labs  Lab 04/08/17 0533 04/09/17 0436  04/12/17 0435  NA 142 139   < > 142  K 4.4 4.4   < > 4.2  CL 111 108   < > 98*  CO2 26 26   < > 39*  GLUCOSE 155* 137*   < > 129*  BUN 20 23*   < > 20  CREATININE 0.74 0.80   < > 0.48*  CALCIUM 8.8* 9.1   < > 9.0  MG 2.2  --   --   --   AST  --  17  --   --   ALT  --  13*  --   --   ALKPHOS  --  74  --   --   BILITOT  --  0.7  --   --    < > = values in this interval not displayed.   ------------------------------------------------------------------------------------------------------------------  Cardiac Enzymes Recent Labs  Lab 04/06/17 2229  TROPONINI <0.03   ------------------------------------------------------------------------------------------------------------------  RADIOLOGY:  Dg Chest 1 View  Result Date: 04/12/2017 CLINICAL DATA:  Dyspnea EXAM: CHEST  1 VIEW COMPARISON:  04/10/2017 FINDINGS: Cardiac shadow is stable. Bilateral infiltrates are again identified and stable in appearance from the prior exam. No sizable effusion is seen. No bony abnormality  is noted. IMPRESSION: Stable bilateral infiltrates.  No new focal abnormality is seen. Electronically Signed   By: Alcide Clever M.D.   On: 04/12/2017 06:46    ASSESSMENT AND PLAN:  MichaelHodgesis a61 y.o.malewith a known history of smoking, hypertension, history of seizure disorder, COPD recently admitted with hypoxic respiratory failure remained on BiPAP chest x-rays suggestive  of ARDS/pneumonia [facility] comes back with shortness of breath and lethargic.  1.  Acute encephalopathy Most likely due to acute on chronic hypoxic respiratory failure with hypoxia secondary to COPD exacerbation, with pneumonitis Check ammonia, rpr, CTH, UDS, neurochecks per routine  2.  Acute on chronic hypoxic respiratory failure Secondary to acute on COPD exacerbation with pneumonitis Successfully weaned off BiPAP, wean O2 off as tolerated, IV Solu-Medrol with tapering as tolerated, pulmonology/Dr. Nicholos Johns did see patient while in house-recommending bronchoscopy and/or VATS lung biopsy when respiratory status more stable Dr. Lonn Georgia  recommended connective tissue studies. Patient may need open lung biopsy for further characterization to rule out cryptogenic organizing pneumonia, hypersensitivity, diffuse alveolar damage, DIP.  2.hypertension Stable on current regiment  3.Seizure disorder Stable on Keppra  4.  Anemia of chronic disease Stable  5.  Chronic tobacco smoking abuse/dependency Cessation counseling and nicotine patch  All the records are reviewed and case discussed with Care Management/Social Workerr. Management plans discussed with the patient, family and they are in agreement.  CODE STATUS: full  TOTAL TIME TAKING CARE OF THIS PATIENT: 35 minutes.     POSSIBLE D/C IN 1-3 DAYS, DEPENDING ON CLINICAL CONDITION.   Evelena Asa [name] M.D on 04/12/2017   Between 7am to 6pm - Pager - [phone]  After 6pm go to [email or web address] - password [email or web address]  [facility]  Office  [phone]  CC: Primary care physician; Jerl Mina, MD  Note: This dictation was prepared with Dragon dictation along with smaller phrase technology. Any transcriptional errors that result from this process are unintentional.

## 2017-04-13 ENCOUNTER — Inpatient Hospital Stay: Payer: Medicare Other

## 2017-04-13 LAB — RPR: RPR: NONREACTIVE

## 2017-04-13 LAB — CREATININE, SERUM: Creatinine, Ser: 0.66 mg/dL (ref 0.61–1.24)

## 2017-04-13 MED ORDER — METOLAZONE 2.5 MG PO TABS
2.5000 mg | ORAL_TABLET | Freq: Once | ORAL | Status: DC
  Filled 2017-04-13: qty 1

## 2017-04-13 MED ORDER — LORAZEPAM 0.5 MG PO TABS
0.5000 mg | ORAL_TABLET | Freq: Three times a day (TID) | ORAL | Status: DC | PRN
  Administered 2017-04-13 – 2017-04-16 (×4): 0.5 mg via ORAL
  Filled 2017-04-13 (×4): qty 1

## 2017-04-13 MED ORDER — LORAZEPAM 1 MG PO TABS: 1.0000 mg | ORAL_TABLET | Freq: Once | ORAL | Status: DC

## 2017-04-13 NOTE — Plan of Care (Signed)
Problem: Education: Goal: Knowledge of General Education information will improve 04/13/2017 1242 by Donnel SaxonKennedy, Lil Lepage L, RN Outcome: Progressing 04/13/2017 1240 by Donnel SaxonKennedy, Matthe Sloane L, RN Outcome: Progressing 04/13/2017 1237 by Donnel SaxonKennedy, Darrin Apodaca L, RN Outcome: Progressing   Problem: Health Behavior/Discharge Planning: Goal: Ability to manage health-related needs will improve 04/13/2017 1242 by Donnel SaxonKennedy, Viren Lebeau L, RN Outcome: Progressing 04/13/2017 1240 by Donnel SaxonKennedy, Talulah Schirmer L, RN Outcome: Progressing 04/13/2017 1237 by Donnel SaxonKennedy, Dailee Manalang L, RN Outcome: Progressing   Problem: Clinical Measurements: Goal: Ability to maintain clinical measurements within normal limits will improve 04/13/2017 1242 by Donnel SaxonKennedy, Lataja Newland L, RN Outcome: Progressing 04/13/2017 1240 by Donnel SaxonKennedy, Kareen Hitsman L, RN Outcome: Progressing 04/13/2017 1237 by Donnel SaxonKennedy, Havah Ammon L, RN Outcome: Progressing Goal: Will remain free from infection 04/13/2017 1242 by Donnel SaxonKennedy, Kristofor Michalowski L, RN Outcome: Progressing 04/13/2017 1240 by Donnel SaxonKennedy, Aly Hauser L, RN Outcome: Progressing 04/13/2017 1237 by Donnel SaxonKennedy, Jaeline Whobrey L, RN Outcome: Progressing Goal: Diagnostic test results will improve 04/13/2017 1242 by Donnel SaxonKennedy, Elma Shands L, RN Outcome: Progressing 04/13/2017 1240 by Donnel SaxonKennedy, Kalmen Lollar L, RN Outcome: Progressing 04/13/2017 1237 by Donnel SaxonKennedy, Alucard Fearnow L, RN Outcome: Progressing Goal: Respiratory complications will improve 04/13/2017 1242 by Donnel SaxonKennedy, Tiziana Cislo L, RN Outcome: Progressing 04/13/2017 1240 by Donnel SaxonKennedy, Bonita Brindisi L, RN Outcome: Progressing 04/13/2017 1237 by Donnel SaxonKennedy, Jaslin Novitski L, RN Outcome: Progressing Goal: Cardiovascular complication will be avoided 04/13/2017 1242 by Donnel SaxonKennedy, Marielle Mantione L, RN Outcome: Progressing 04/13/2017 1240 by Donnel SaxonKennedy, Brion Sossamon L, RN Outcome: Progressing 04/13/2017 1237 by Donnel SaxonKennedy, Deran Barro L, RN Outcome: Progressing   Problem: Activity: Goal: Risk for activity intolerance will  decrease 04/13/2017 1242 by Donnel SaxonKennedy, Calden Dorsey L, RN Outcome: Progressing 04/13/2017 1240 by Donnel SaxonKennedy, Markeria Goetsch L, RN Outcome: Progressing 04/13/2017 1237 by Donnel SaxonKennedy, Caellum Mancil L, RN Outcome: Progressing   Problem: Nutrition: Goal: Adequate nutrition will be maintained 04/13/2017 1242 by Donnel SaxonKennedy, Nguyen Todorov L, RN Outcome: Progressing 04/13/2017 1240 by Donnel SaxonKennedy, Keyion Knack L, RN Outcome: Progressing 04/13/2017 1237 by Donnel SaxonKennedy, Glenora Morocho L, RN Outcome: Progressing   Problem: Coping: Goal: Level of anxiety will decrease 04/13/2017 1242 by Donnel SaxonKennedy, Ysabela Keisler L, RN Outcome: Progressing 04/13/2017 1240 by Donnel SaxonKennedy, Kaira Stringfield L, RN Outcome: Progressing 04/13/2017 1237 by Donnel SaxonKennedy, Sherman Lipuma L, RN Outcome: Progressing   Problem: Elimination: Goal: Will not experience complications related to bowel motility 04/13/2017 1242 by Donnel SaxonKennedy, Nyron Mozer L, RN Outcome: Progressing 04/13/2017 1240 by Donnel SaxonKennedy, Yocheved Depner L, RN Outcome: Progressing 04/13/2017 1237 by Donnel SaxonKennedy, Akansha Wyche L, RN Outcome: Progressing Goal: Will not experience complications related to urinary retention 04/13/2017 1242 by Donnel SaxonKennedy, Chynah Orihuela L, RN Outcome: Progressing 04/13/2017 1240 by Donnel SaxonKennedy, Torianne Laflam L, RN Outcome: Progressing 04/13/2017 1237 by Donnel SaxonKennedy, Greydis Stlouis L, RN Outcome: Progressing   Problem: Pain Managment: Goal: General experience of comfort will improve 04/13/2017 1242 by Donnel SaxonKennedy, Dorothy Landgrebe L, RN Outcome: Progressing 04/13/2017 1240 by Donnel SaxonKennedy, Nasiah Lehenbauer L, RN Outcome: Progressing 04/13/2017 1237 by Donnel SaxonKennedy, Radwan Cowley L, RN Outcome: Progressing   Problem: Safety: Goal: Ability to remain free from injury will improve 04/13/2017 1242 by Donnel SaxonKennedy, Krzysztof Reichelt L, RN Outcome: Progressing 04/13/2017 1240 by Donnel SaxonKennedy, Adan Beal L, RN Outcome: Progressing 04/13/2017 1237 by Donnel SaxonKennedy, Adriene Knipfer L, RN Outcome: Progressing   Problem: Skin Integrity: Goal: Risk for impaired skin integrity will decrease 04/13/2017 1242 by  Donnel SaxonKennedy, Amarri Michaelson L, RN Outcome: Progressing 04/13/2017 1240 by Donnel SaxonKennedy, Sadee Osland L, RN Outcome: Progressing 04/13/2017 1237 by Donnel SaxonKennedy, Hawke Villalpando L, RN Outcome: Progressing   Problem: Spiritual Needs Goal: Ability to function at adequate level 04/13/2017 1242 by Donnel SaxonKennedy, Damar Petit L, RN Outcome: Progressing 04/13/2017 1240 by Donnel SaxonKennedy, Bronsyn Shappell L, RN Outcome: Progressing 04/13/2017 1237 by Donnel SaxonKennedy, Jonette Wassel L, RN Outcome: Progressing

## 2017-04-13 NOTE — Plan of Care (Signed)
  Problem: Education: Goal: Knowledge of General Education information will improve 04/13/2017 1240 by Donnel SaxonKennedy, Julann Mcgilvray L, RN Outcome: Progressing 04/13/2017 1237 by Donnel SaxonKennedy, Renne Platts L, RN Outcome: Progressing   Problem: Health Behavior/Discharge Planning: Goal: Ability to manage health-related needs will improve 04/13/2017 1240 by Donnel SaxonKennedy, Shaneal Barasch L, RN Outcome: Progressing 04/13/2017 1237 by Donnel SaxonKennedy, Mia Winthrop L, RN Outcome: Progressing   Problem: Clinical Measurements: Goal: Ability to maintain clinical measurements within normal limits will improve 04/13/2017 1240 by Donnel SaxonKennedy, Randie Tallarico L, RN Outcome: Progressing 04/13/2017 1237 by Donnel SaxonKennedy, Ireene Ballowe L, RN Outcome: Progressing Goal: Will remain free from infection 04/13/2017 1240 by Donnel SaxonKennedy, Daley Mooradian L, RN Outcome: Progressing 04/13/2017 1237 by Donnel SaxonKennedy, Kathelene Rumberger L, RN Outcome: Progressing Goal: Diagnostic test results will improve 04/13/2017 1240 by Donnel SaxonKennedy, Zafir Schauer L, RN Outcome: Progressing 04/13/2017 1237 by Donnel SaxonKennedy, Wesson Stith L, RN Outcome: Progressing Goal: Respiratory complications will improve 04/13/2017 1240 by Donnel SaxonKennedy, Anabel Lykins L, RN Outcome: Progressing 04/13/2017 1237 by Donnel SaxonKennedy, Yoshimi Sarr L, RN Outcome: Progressing Goal: Cardiovascular complication will be avoided 04/13/2017 1240 by Donnel SaxonKennedy, Danalee Flath L, RN Outcome: Progressing 04/13/2017 1237 by Donnel SaxonKennedy, Latausha Flamm L, RN Outcome: Progressing   Problem: Activity: Goal: Risk for activity intolerance will decrease 04/13/2017 1240 by Donnel SaxonKennedy, Calven Gilkes L, RN Outcome: Progressing 04/13/2017 1237 by Donnel SaxonKennedy, Atiyana Welte L, RN Outcome: Progressing   Problem: Nutrition: Goal: Adequate nutrition will be maintained 04/13/2017 1240 by Donnel SaxonKennedy, Bernie Ransford L, RN Outcome: Progressing 04/13/2017 1237 by Donnel SaxonKennedy, Denishia Citro L, RN Outcome: Progressing   Problem: Coping: Goal: Level of anxiety will decrease 04/13/2017 1240 by Donnel SaxonKennedy, Kamie Korber L, RN Outcome:  Progressing 04/13/2017 1237 by Donnel SaxonKennedy, Zafar Debrosse L, RN Outcome: Progressing   Problem: Elimination: Goal: Will not experience complications related to bowel motility 04/13/2017 1240 by Donnel SaxonKennedy, Francina Beery L, RN Outcome: Progressing 04/13/2017 1237 by Donnel SaxonKennedy, Eryck Negron L, RN Outcome: Progressing Goal: Will not experience complications related to urinary retention 04/13/2017 1240 by Donnel SaxonKennedy, Nina Hoar L, RN Outcome: Progressing 04/13/2017 1237 by Donnel SaxonKennedy, Paden Kuras L, RN Outcome: Progressing   Problem: Pain Managment: Goal: General experience of comfort will improve 04/13/2017 1240 by Donnel SaxonKennedy, Lorie Cleckley L, RN Outcome: Progressing 04/13/2017 1237 by Donnel SaxonKennedy, Royston Bekele L, RN Outcome: Progressing   Problem: Safety: Goal: Ability to remain free from injury will improve 04/13/2017 1240 by Donnel SaxonKennedy, Alyrica Thurow L, RN Outcome: Progressing 04/13/2017 1237 by Donnel SaxonKennedy, Valor Turberville L, RN Outcome: Progressing   Problem: Skin Integrity: Goal: Risk for impaired skin integrity will decrease 04/13/2017 1240 by Donnel SaxonKennedy, Nerine Pulse L, RN Outcome: Progressing 04/13/2017 1237 by Donnel SaxonKennedy, Emerick Weatherly L, RN Outcome: Progressing   Problem: Spiritual Needs Goal: Ability to function at adequate level 04/13/2017 1240 by Donnel SaxonKennedy, Shamaria Kavan L, RN Outcome: Progressing 04/13/2017 1237 by Donnel SaxonKennedy, Koda Defrank L, RN Outcome: Progressing

## 2017-04-13 NOTE — Plan of Care (Signed)

## 2017-04-13 NOTE — Progress Notes (Addendum)
[employer] [facility] - [place] at [facility]   PATIENT NAME: Marco [name]    MR#:  [id number]  DATE OF BIRTH:  1955/01/13  SUBJECTIVE:  CHIEF COMPLAINT:   Chief Complaint  Patient presents with  . Respiratory Distress  Confusion continues per nursing staff, patient without complaint, proceed with MRI of the brain, respiratory/pulmonology input appreciated  REVIEW OF SYSTEMS:  CONSTITUTIONAL: No fever, fatigue or weakness.  EYES: No blurred or double vision.  EARS, NOSE, AND THROAT: No tinnitus or ear pain.  RESPIRATORY: No cough, shortness of breath, wheezing or hemoptysis.  CARDIOVASCULAR: No chest pain, orthopnea, edema.  GASTROINTESTINAL: No nausea, vomiting, diarrhea or abdominal pain.  GENITOURINARY: No dysuria, hematuria.  ENDOCRINE: No polyuria, nocturia,  HEMATOLOGY: No anemia, easy bruising or bleeding SKIN: No rash or lesion. MUSCULOSKELETAL: No joint pain or arthritis.   NEUROLOGIC: No tingling, numbness, weakness.  PSYCHIATRY: No anxiety or depression.   ROS  DRUG ALLERGIES:   Allergies  Allergen Reactions  . Hydrocodone-Acetaminophen Nausea Only and Nausea And Vomiting    VITALS:  Blood pressure 121/82, pulse (!) 102, temperature 98.4 F (36.9 C), resp. rate 16, height 5\' 6"  (1.676 m), weight 68.5 kg (151 lb), SpO2 96 %.  PHYSICAL EXAMINATION:  GENERAL:  62 y.o.-year-old patient lying in the bed with no acute distress.  EYES: Pupils equal, round, reactive to light and accommodation. No scleral icterus. Extraocular muscles intact.  HEENT: Head atraumatic, normocephalic. Oropharynx and nasopharynx clear.  NECK:  Supple, no jugular venous distention. No thyroid enlargement, no tenderness.  LUNGS: Normal breath sounds bilaterally, no wheezing, rales,rhonchi or crepitation. No use of accessory muscles of respiration.  CARDIOVASCULAR: S1, S2 normal. No murmurs, rubs, or gallops.  ABDOMEN: Soft, nontender, nondistended. Bowel sounds present. No  organomegaly or mass.  EXTREMITIES: No pedal edema, cyanosis, or clubbing.  NEUROLOGIC: Cranial nerves II through XII are intact. Muscle strength 5/5 in all extremities. Sensation intact. Gait not checked.  PSYCHIATRIC: The patient is alert and oriented x 3.  SKIN: No obvious rash, lesion, or ulcer.   Physical Exam LABORATORY PANEL:   CBC Recent Labs  Lab 04/09/17 0436  WBC 9.1  HGB 7.8*  HCT 23.9*  PLT 285   ------------------------------------------------------------------------------------------------------------------  Chemistries  Recent Labs  Lab 04/08/17 0533 04/09/17 0436  04/12/17 0435 04/13/17 0331  NA 142 139   < > 142  --   K 4.4 4.4   < > 4.2  --   CL 111 108   < > 98*  --   CO2 26 26   < > 39*  --   GLUCOSE 155* 137*   < > 129*  --   BUN 20 23*   < > 20  --   CREATININE 0.74 0.80   < > 0.48* 0.66  CALCIUM 8.8* 9.1   < > 9.0  --   MG 2.2  --   --   --   --   AST  --  17  --   --   --   ALT  --  13*  --   --   --   ALKPHOS  --  74  --   --   --   BILITOT  --  0.7  --   --   --    < > = values in this interval not displayed.   ------------------------------------------------------------------------------------------------------------------  Cardiac Enzymes Recent Labs  Lab 04/06/17 2229  TROPONINI <0.03   ------------------------------------------------------------------------------------------------------------------  RADIOLOGY:  Dg  Chest 1 View  Result Date: 04/12/2017 CLINICAL DATA:  Dyspnea EXAM: CHEST  1 VIEW COMPARISON:  04/10/2017 FINDINGS: Cardiac shadow is stable. Bilateral infiltrates are again identified and stable in appearance from the prior exam. No sizable effusion is seen. No bony abnormality is noted. IMPRESSION: Stable bilateral infiltrates.  No new focal abnormality is seen. Electronically Signed   By: Marco CleverMark  [name] M.D.   On: 04/12/2017 06:46   Ct Head Wo Contrast  Result Date: 04/12/2017 CLINICAL DATA:  62 year old with acute  confusion. EXAM: CT HEAD WITHOUT CONTRAST TECHNIQUE: Contiguous axial images were obtained from the base of the skull through the vertex without intravenous contrast. COMPARISON:  03/31/2017 FINDINGS: Brain: No evidence for acute hemorrhage, mass lesion, midline shift, hydrocephalus or large infarct. Vascular: No hyperdense vessel or unexpected calcification. Skull: Normal. Negative for fracture or focal lesion. Sinuses/Orbits: There is a polyp or retention cyst in the right maxillary sinus. Stable partial opacification in the left mastoid air cells. Other: None IMPRESSION: No acute intracranial abnormality. Electronically Signed   By: Marco OverlieAdam  [name] M.D.   On: 04/12/2017 15:48    ASSESSMENT AND PLAN:  MichaelHodgesis a61 y.o.malewith a known history of smoking, hypertension, history of seizure disorder, COPD recently admitted with hypoxic respiratory failure remained on BiPAP chest x-rays suggestive of ARDS/pneumonia was [facility] five days ago comes back with shortness of breath and lethargic.  1.Acute encephalopathy/confusion Stable ?  Exacerbated by steroids Most likely due to acute on chronichypoxicrespiratory failure with hypoxia secondary to COPD exacerbation,withpneumonitis ammonia level normal, rpr nonreactive, CTH did not show any acute process, follow-up on UDS, check MRI of the brain for further evaluation, neurochecks per routine, consult neurology for expert opinion  2.  Acute on chronic hypoxic respiratory failure Resolving Secondary to acute on COPD exacerbation with pneumonitis Successfully weaned off BiPAP, wean O2 off as tolerated, IV Solu-Medrol with tapering as tolerated, pulmonology/Dr. Nicholos [name] did see patient while in house-recommendingbronchoscopy and/or VATS lung biopsy when respiratory status more stable  2.hypertension Stable on current regiment  3.Seizure disorder Stable on Keppra  4. Anemia of chronic  disease Stable  5.  Chronic tobacco smoking abuse/dependency Cessation counseling and nicotine patch   All the records are reviewed and case discussed with Care Management/Social Workerr. Management plans discussed with the patient, family and they are in agreement.  CODE STATUS: full  TOTAL TIME TAKING CARE OF THIS PATIENT: 35 minutes.     POSSIBLE D/C IN 2-3 DAYS, DEPENDING ON CLINICAL CONDITION.   Evelena AsaMontell D Lyzette Reinhardt M.D on 04/13/2017   Between 7am to 6pm - Pager - [phone]  After 6pm go to [email or web address] - password [email or web address]  [facility]  Office  [phone]  CC: Primary care physician; Jerl MinaHedrick, James, MD  Note: This dictation was prepared with Dragon dictation along with smaller phrase technology. Any transcriptional errors that result from this process are unintentional.

## 2017-04-14 ENCOUNTER — Inpatient Hospital Stay: Payer: Medicare Other

## 2017-04-14 DIAGNOSIS — R410 Disorientation, unspecified: Secondary | ICD-10-CM

## 2017-04-14 LAB — BASIC METABOLIC PANEL
ANION GAP: 6 (ref 5–15)
BUN: 23 mg/dL — ABNORMAL HIGH (ref 6–20)
CALCIUM: 9.1 mg/dL (ref 8.9–10.3)
CHLORIDE: 95 mmol/L — AB (ref 101–111)
CO2: 37 mmol/L — AB (ref 22–32)
Creatinine, Ser: 0.86 mg/dL (ref 0.61–1.24)
GFR calc non Af Amer: >60 mL/min (ref >60)
Glucose, Bld: 134 mg/dL — ABNORMAL HIGH (ref 65–99)
Potassium: 3.4 mmol/L — ABNORMAL LOW (ref 3.5–5.1)
SODIUM: 138 mmol/L (ref 135–145)

## 2017-04-14 LAB — URINE DRUG SCREEN, QUALITATIVE (ARMC ONLY)
Amphetamines, Ur Screen: NOT DETECTED
BARBITURATES, UR SCREEN: NOT DETECTED
Benzodiazepine, Ur Scrn: POSITIVE — AB
CANNABINOID 50 NG, UR ~~LOC~~: NOT DETECTED
COCAINE METABOLITE, UR ~~LOC~~: NOT DETECTED
MDMA (Ecstasy)Ur Screen: NOT DETECTED
METHADONE SCREEN, URINE: NOT DETECTED
Opiate, Ur Screen: NOT DETECTED
Phencyclidine (PCP) Ur S: NOT DETECTED
TRICYCLIC, UR SCREEN: NOT DETECTED

## 2017-04-14 LAB — MAGNESIUM: MAGNESIUM: 1.7 mg/dL (ref 1.7–2.4)

## 2017-04-14 MED ORDER — POTASSIUM CHLORIDE 20 MEQ PO PACK
20.0000 meq | PACK | Freq: Once | ORAL | Status: AC
  Administered 2017-04-14: 17:00:00 20 meq via ORAL
  Filled 2017-04-14: qty 1

## 2017-04-14 MED ORDER — ARFORMOTEROL TARTRATE 15 MCG/2ML IN NEBU: 15.0000 ug | INHALATION_SOLUTION | Freq: Two times a day (BID) | RESPIRATORY_TRACT | Status: DC

## 2017-04-14 MED ORDER — LORAZEPAM 0.5 MG PO TABS
0.5000 mg | ORAL_TABLET | Freq: Once | ORAL | Status: AC
  Administered 2017-04-14: 0.5 mg via ORAL
  Filled 2017-04-14: qty 1

## 2017-04-14 MED ORDER — ARFORMOTEROL TARTRATE 15 MCG/2ML IN NEBU
15.0000 ug | INHALATION_SOLUTION | Freq: Two times a day (BID) | RESPIRATORY_TRACT | Status: DC
  Administered 2017-04-15 – 2017-04-16 (×3): 15 ug via RESPIRATORY_TRACT
  Filled 2017-04-14 (×5): qty 2

## 2017-04-14 MED ORDER — ALBUTEROL SULFATE (2.5 MG/3ML) 0.083% IN NEBU
2.5000 mg | INHALATION_SOLUTION | Freq: Four times a day (QID) | RESPIRATORY_TRACT | Status: DC
  Administered 2017-04-14 (×3): 2.5 mg via RESPIRATORY_TRACT
  Filled 2017-04-14 (×3): qty 3

## 2017-04-14 NOTE — Progress Notes (Signed)
[employer] - [place] at [facility]   PATIENT NAME: Lisabeth RegisterMichael Hoard    MR#:  [id number]  DATE OF BIRTH:  1955-06-27  SUBJECTIVE:  CHIEF COMPLAINT:   Chief Complaint  Patient presents with  . Respiratory Distress  Patient without complaint, case discussed with the patient's significant other/wife via phone with all questions answered, neurology input appreciated, physical therapy to see  REVIEW OF SYSTEMS:  CONSTITUTIONAL: No fever, fatigue or weakness.  EYES: No blurred or double vision.  EARS, NOSE, AND THROAT: No tinnitus or ear pain.  RESPIRATORY: No cough, shortness of breath, wheezing or hemoptysis.  CARDIOVASCULAR: No chest pain, orthopnea, edema.  GASTROINTESTINAL: No nausea, vomiting, diarrhea or abdominal pain.  GENITOURINARY: No dysuria, hematuria.  ENDOCRINE: No polyuria, nocturia,  HEMATOLOGY: No anemia, easy bruising or bleeding SKIN: No rash or lesion. MUSCULOSKELETAL: No joint pain or arthritis.   NEUROLOGIC: No tingling, numbness, weakness.  PSYCHIATRY: No anxiety or depression.   ROS  DRUG ALLERGIES:   Allergies  Allergen Reactions  . Hydrocodone-Acetaminophen Nausea Only and Nausea And Vomiting    VITALS:  Blood pressure 137/67, pulse 97, temperature 97.9 F (36.6 C), temperature source Oral, resp. rate 19, height 5\' 6"  (1.676 m), weight 68.5 kg (151 lb), SpO2 90 %.  PHYSICAL EXAMINATION:  GENERAL:  62 y.o.-year-old patient lying in the bed with no acute distress.  EYES: Pupils equal, round, reactive to light and accommodation. No scleral icterus. Extraocular muscles intact.  HEENT: Head atraumatic, normocephalic. Oropharynx and nasopharynx clear.  NECK:  Supple, no jugular venous distention. No thyroid enlargement, no tenderness.  LUNGS: Normal breath sounds bilaterally, no wheezing, rales,rhonchi or crepitation. No use of accessory muscles of respiration.  CARDIOVASCULAR: S1, S2 normal. No murmurs, rubs, or gallops.  ABDOMEN: Soft,  nontender, nondistended. Bowel sounds present. No organomegaly or mass.  EXTREMITIES: No pedal edema, cyanosis, or clubbing.  NEUROLOGIC: Cranial nerves II through XII are intact. Muscle strength 5/5 in all extremities. Sensation intact. Gait not checked.  PSYCHIATRIC: The patient is alert and oriented x 3.  SKIN: No obvious rash, lesion, or ulcer.   Physical Exam LABORATORY PANEL:   CBC Recent Labs  Lab 04/09/17 0436  WBC 9.1  HGB 7.8*  HCT 23.9*  PLT 285   ------------------------------------------------------------------------------------------------------------------  Chemistries  Recent Labs  Lab 04/09/17 0436  04/14/17 0338  NA 139   < > 138  K 4.4   < > 3.4*  CL 108   < > 95*  CO2 26   < > 37*  GLUCOSE 137*   < > 134*  BUN 23*   < > 23*  CREATININE 0.80   < > 0.86  CALCIUM 9.1   < > 9.1  MG  --   --  1.7  AST 17  --   --   ALT 13*  --   --   ALKPHOS 74  --   --   BILITOT 0.7  --   --    < > = values in this interval not displayed.   ------------------------------------------------------------------------------------------------------------------  Cardiac Enzymes No results for input(s): TROPONINI in the last 168 hours. ------------------------------------------------------------------------------------------------------------------  RADIOLOGY:  Ct Head Wo Contrast  Result Date: 04/12/2017 CLINICAL DATA:  62 year old with acute confusion. EXAM: CT HEAD WITHOUT CONTRAST TECHNIQUE: Contiguous axial images were obtained from the base of the skull through the vertex without intravenous contrast. COMPARISON:  03/31/2017 FINDINGS: Brain: No evidence for acute hemorrhage, mass lesion, midline shift, hydrocephalus or large infarct. Vascular: No  hyperdense vessel or unexpected calcification. Skull: Normal. Negative for fracture or focal lesion. Sinuses/Orbits: There is a polyp or retention cyst in the right maxillary sinus. Stable partial opacification in the left  mastoid air cells. Other: None IMPRESSION: No acute intracranial abnormality. Electronically Signed   By: Richarda Overlie M.D.   On: 04/12/2017 15:48   Mr Brain Wo Contrast  Result Date: 04/13/2017 CLINICAL DATA:  Altered level of consciousness. EXAM: MRI HEAD WITHOUT CONTRAST TECHNIQUE: Multiplanar, multiecho pulse sequences of the brain and surrounding structures were obtained without intravenous contrast. COMPARISON:  Head CT from yesterday FINDINGS: Brain: No acute infarction, hemorrhage, hydrocephalus, extra-axial collection or mass lesion. Mild generalized cerebral volume loss. Generalized volume loss has occurred since 2013 comparison. Very mild periventricular FLAIR hyperintensity. Vascular: Major flow voids are preserved. Skull and upper cervical spine: Stable marrow compared to 2013. No focal lesion is seen. Sinuses/Orbits: Chronic left mastoid opacification. Minimal right mastoid opacification. Negative nasopharynx. IMPRESSION: 1. No acute finding. 2. Mild atrophy that has developed since 2013 comparison. Electronically Signed   By: Marnee Spring M.D.   On: 04/13/2017 14:10    ASSESSMENT AND PLAN:  MichaelHodgesis a62 y.o.malewith a known history of smoking, hypertension, history of seizure disorder, COPD recently admitted with hypoxic respiratory failure remained on BiPAP chest x-rays suggestive of ARDS/pneumonia was [facility] five days ago comes back with shortness of breath and lethargic.  1.Acute encephalopathy/confusion Stable ?  Exacerbated by steroids Most likely due to acute on chronichypoxicrespiratory failure with hypoxia secondary to COPD exacerbation,withpneumonitis ammonia level normal, rpr nonreactive, CTH did not show any acute process,  MRI of the brain unimpressive, urine drug screen noted for benzodiazepines only, neurology did see patient while in house-no intervention recommended  2.Acute on chronic hypoxic respiratory  failure Resolving Secondary to acute on COPD exacerbation with pneumonitis Successfully weaned off BiPAP, wean O2 off as tolerated, IV Solu-Medrol with tapering as tolerated, pulmonology/Dr. Ramachandrandid see patient while in house-recommendingbronchoscopy and/or VATS lung biopsy when respiratory status more stable  2.hypertension Stable on current regiment  3.Seizure disorder Stable onKeppra  4. Anemia of chronic disease Stable  5.Chronic tobacco smoking abuse/dependency Cessation counseling and nicotine patch   All the records are reviewed and case discussed with Care Management/Social Workerr. Management plans discussed with the patient, family and they are in agreement.  CODE STATUS: full  TOTAL TIME TAKING CARE OF THIS PATIENT: 35 minutes.     POSSIBLE D/C IN 1-3 DAYS, DEPENDING ON CLINICAL CONDITION.   Evelena Asa Kayona Foor M.D on 04/14/2017   Between 7am to 6pm - Pager - [phone]  After 6pm go to [email or web address] - password [email or web address]  [facility]  Office  [phone]  CC: Primary care physician; Jerl Mina, MD  Note: This dictation was prepared with Dragon dictation along with smaller phrase technology. Any transcriptional errors that result from this process are unintentional.

## 2017-04-14 NOTE — Consult Note (Signed)
[facility] Consult   Reason for Consult:  delirium Referring Physician:  Dr. Kary Kos Patient Identification: Marco [name] MRN:  [id number] Principal Diagnosis: <principal problem not specified> Diagnosis:   Patient Active Problem List   Diagnosis Date Noted  . Acute on chronic respiratory failure with hypoxia ([place]) [J96.21] 04/06/2017  . Pressure injury of skin [L89.90] 04/06/2017  . HCAP (healthcare-associated pneumonia) [J18.9] 03/26/2017  . Malnutrition of moderate degree [E44.0] 03/22/2017  . Sepsis ([place]) [A41.9] 03/21/2017  . Hyponatremia [E87.1] 03/12/2017  . Acute encephalopathy [G93.40] 03/04/2017    Total Time spent with patient: 1 hour  Subjective:   "I don't feel good"  HPI- Marco [name] is a 62 y.o. male patient admitted with  SOB, lethargy, AMS, confusion, delirium. , recently had ARDS/PNA. Psych consult called for delirium. Chart reviewed, pt seen. Pt has h/o anxiety and depression  But home meds do not have psych meds. Pt irritable, confused, unable to tell where he is. Unable to tell what month and year, states " I don't care". Pt  states " I am sick, but unable to elaborate. Denies AVH. Denies SI/HI. When I asked about depression, states " Im sure , I probably have". Pt poor historian. States he only drinks alcohol occasionally, although it is  recorded in chart that he drinks 4 cans of beers in a week. Pt reportedly calling out his wife Fraser Din, but states to me that he does not know her.   Substance use hx- pt smokes 1 PPD. States he only drinks alcohol occasionally, although it is  recorded in chart that he drinks 4 cans of beers in a week. Denies use of illicit drugs UDS+ benzo.  MRI brain-  IMPRESSION: 1. No acute finding. 2. Mild atrophy that has developed since 2013 comparison.  Ct head- IMPRESSION: No acute intracranial abnormality  Collateral could not be reached at this time.  Past Psychiatric History: h/o depression and anxiety, other  info could not be obtained.   Risk to Self: Is patient at risk for suicide?: No denies Risk to Others:  denies Prior Inpatient Therapy:   Prior Outpatient Therapy:    Past Medical History:  Past Medical History:  Diagnosis Date  . Anxiety   . Arthritis   . Depression   . Heartburn   . Seizures ([place])     Past Surgical History:  Procedure Laterality Date  . KNEE ARTHROSCOPY  1975   Family History:  Family History  Problem Relation Age of Onset  . Diabetes Mellitus II Mother   . Breast cancer Mother   . Kidney disease Neg Hx   . Prostate cancer Neg Hx    Family Psychiatric  History: unknown Social History:  Social History   Substance and Sexual Activity  Alcohol Use Yes  . Alcohol/week: 2.4 oz  . Types: 4 Cans of beer per week     Social History   Substance and Sexual Activity  Drug Use No    Social History   Socioeconomic History  . Marital status: Married    Spouse name: Not on file  . Number of children: Not on file  . Years of education: Not on file  . Highest education level: Not on file  Occupational History  . Occupation: retired  Scientific laboratory technician  . Financial resource strain: Not on file  . Food insecurity:    Worry: Not on file    Inability: Not on file  . Transportation needs:    Medical: Not on file  Non-medical: Not on file  Tobacco Use  . Smoking status: Current Every Day Smoker    Packs/day: 1.00  . Smokeless tobacco: Never Used  Substance and Sexual Activity  . Alcohol use: Yes    Alcohol/week: 2.4 oz    Types: 4 Cans of beer per week  . Drug use: No  . Sexual activity: Not on file  Lifestyle  . Physical activity:    Days per week: Not on file    Minutes per session: Not on file  . Stress: Not on file  Relationships  . Social connections:    Talks on phone: Not on file    Gets together: Not on file    Attends religious service: Not on file    Active member of club or organization: Not on file    Attends meetings of clubs or  organizations: Not on file    Relationship status: Not on file  Other Topics Concern  . Not on file  Social History Narrative  . Not on file   Additional Social History:  married, lives with wife  Allergies:   Allergies  Allergen Reactions  . Hydrocodone-Acetaminophen Nausea Only and Nausea And Vomiting    Labs:  Results for orders placed or performed during the hospital encounter of 04/06/17 (from the past 48 hour(s))  Creatinine, serum     Status: None   Collection Time: 04/13/17  3:31 AM  Result Value Ref Range   Creatinine, Ser 0.66 0.61 - 1.24 mg/dL   GFR calc non Af Amer >60 >60 mL/min   GFR calc Af Amer >60 >60 mL/min    Comment: (NOTE) The eGFR has been calculated using the CKD EPI equation. This calculation has not been validated in all clinical situations. eGFR's persistently <60 mL/min signify possible Chronic Kidney Disease. Performed at [facility], [address] [place], [place] [address]   Urine Drug Screen, Qualitative [facility] only)     Status: Abnormal   Collection Time: 04/14/17 12:33 AM  Result Value Ref Range   Tricyclic, Ur Screen NONE DETECTED NONE DETECTED   Amphetamines, Ur Screen NONE DETECTED NONE DETECTED   MDMA (Ecstasy)Ur Screen NONE DETECTED NONE DETECTED   Cocaine Metabolite,Ur [place] NONE DETECTED NONE DETECTED   Opiate, Ur Screen NONE DETECTED NONE DETECTED   Phencyclidine (PCP) Ur S NONE DETECTED NONE DETECTED   Cannabinoid 50 Ng, Ur [place] NONE DETECTED NONE DETECTED   Barbiturates, Ur Screen NONE DETECTED NONE DETECTED   Benzodiazepine, Ur Scrn POSITIVE (A) NONE DETECTED   Methadone Scn, Ur NONE DETECTED NONE DETECTED    Comment: (NOTE) Tricyclics + metabolites, urine    Cutoff 1000 ng/mL Amphetamines + metabolites, urine  Cutoff 1000 ng/mL MDMA (Ecstasy), urine              Cutoff 500 ng/mL Cocaine Metabolite, urine          Cutoff 300 ng/mL Opiate + metabolites, urine        Cutoff 300 ng/mL Phencyclidine (PCP), urine          Cutoff 25 ng/mL Cannabinoid, urine                 Cutoff 50 ng/mL Barbiturates + metabolites, urine  Cutoff 200 ng/mL Benzodiazepine, urine              Cutoff 200 ng/mL Methadone, urine                   Cutoff 300 ng/mL The urine drug  screen provides only a preliminary, unconfirmed analytical test result and should not be used for non-medical purposes. Clinical consideration and professional judgment should be applied to any positive drug screen result due to possible interfering substances. A more specific alternate chemical method must be used in order to obtain a confirmed analytical result. Gas chromatography / mass spectrometry (GC/MS) is the preferred confirmat ory method. Performed at [facility], [address] [place], [place] [address]   Basic metabolic panel     Status: Abnormal   Collection Time: 04/14/17  3:38 AM  Result Value Ref Range   Sodium 138 135 - 145 mmol/L   Potassium 3.4 (L) 3.5 - 5.1 mmol/L   Chloride 95 (L) 101 - 111 mmol/L   CO2 37 (H) 22 - 32 mmol/L   Glucose, Bld 134 (H) 65 - 99 mg/dL   BUN 23 (H) 6 - 20 mg/dL   Creatinine, Ser 0.86 0.61 - 1.24 mg/dL   Calcium 9.1 8.9 - 10.3 mg/dL   GFR calc non Af Amer >60 >60 mL/min   GFR calc Af Amer >60 >60 mL/min    Comment: (NOTE) The eGFR has been calculated using the CKD EPI equation. This calculation has not been validated in all clinical situations. eGFR's persistently <60 mL/min signify possible Chronic Kidney Disease.    Anion gap 6 5 - 15    Comment: Performed at [facility], [address] [place], [place] [address]  Magnesium     Status: None   Collection Time: 04/14/17  3:38 AM  Result Value Ref Range   Magnesium 1.7 1.7 - 2.4 mg/dL    Comment: Performed at [facility], [address] [place], [place] [address]    Current Facility-Administered Medications  Medication Dose Route Frequency Provider Last Rate Last Dose  . acetaminophen (TYLENOL) tablet 650  mg  650 mg Oral Q6H PRN Fritzi Mandes, MD       Or  . acetaminophen (TYLENOL) suppository 650 mg  650 mg Rectal Q6H PRN Fritzi Mandes, MD      . albuterol (PROVENTIL) (2.5 MG/3ML) 0.083% nebulizer solution 2.5 mg  2.5 mg Nebulization Q6H Salary, Montell D, MD   2.5 mg at 04/14/17 1338  . arformoterol (BROVANA) nebulizer solution 15 mcg  15 mcg Nebulization BID Salary, Montell D, MD      . budesonide (PULMICORT) nebulizer solution 0.5 mg  0.5 mg Nebulization Q12H Flora Lipps, MD   0.5 mg at 04/14/17 0738  . enoxaparin (LOVENOX) injection 40 mg  40 mg Subcutaneous Q24H Demetrios Loll, MD   40 mg at 04/14/17 1637  . ferrous sulfate tablet 325 mg  325 mg Oral BID WC Tukov, Magadalene S, NP   325 mg at 04/14/17 1637  . folic acid (FOLVITE) tablet 1 mg  1 mg Oral Daily Wilhelmina Mcardle, MD   1 mg at 04/14/17 0802  . levETIRAcetam (KEPPRA) 100 MG/ML solution 750 mg  750 mg Oral BID Pernell Dupre, RPH   750 mg at 04/14/17 0806  . LORazepam (ATIVAN) tablet 0.5 mg  0.5 mg Oral Q8H PRN Salary, Montell D, MD   0.5 mg at 04/14/17 1200  . LORazepam (ATIVAN) tablet 1 mg  1 mg Oral Once Salary, Montell D, MD      . MEDLINE mouth rinse  15 mL Mouth Rinse BID Flora Lipps, MD   15 mL at 04/12/17 1113  . methylPREDNISolone sodium succinate (SOLU-MEDROL) 125 mg/2 mL injection 80 mg  80  mg Intravenous TID Laverle Hobby, MD   80 mg at 04/14/17 1637  . metolazone (ZAROXOLYN) tablet 2.5 mg  2.5 mg Oral Once Salary, Montell D, MD      . metoprolol tartrate (LOPRESSOR) tablet 12.5 mg  12.5 mg Oral BID Fritzi Mandes, MD   12.5 mg at 04/14/17 0802  . multivitamin with minerals tablet 1 tablet  1 tablet Oral Daily Fritzi Mandes, MD   1 tablet at 04/14/17 0802  . nicotine (NICODERM CQ - dosed in mg/24 hours) patch 21 mg  21 mg Transdermal Daily Fritzi Mandes, MD   21 mg at 04/14/17 0802  . ondansetron (ZOFRAN) tablet 4 mg  4 mg Oral Q6H PRN Fritzi Mandes, MD       Or  . ondansetron [facility]) injection 4 mg  4 mg Intravenous Q6H  PRN Fritzi Mandes, MD      . pantoprazole (PROTONIX) EC tablet 40 mg  40 mg Oral Daily Fritzi Mandes, MD   40 mg at 04/14/17 0802  . polyethylene glycol (MIRALAX / GLYCOLAX) packet 17 g  17 g Oral Daily Dorene Sorrow S, NP   17 g at 04/14/17 0801  . senna-docusate (Senokot-S) tablet 2 tablet  2 tablet Oral BID Flora Lipps, MD   2 tablet at 04/14/17 0802  . tamsulosin (FLOMAX) capsule 0.4 mg  0.4 mg Oral Daily Fritzi Mandes, MD   0.4 mg at 04/14/17 0802  . thiamine (VITAMIN B-1) tablet 100 mg  100 mg Oral Daily Fritzi Mandes, MD   100 mg at 04/14/17 0802  . vitamin B-12 (CYANOCOBALAMIN) tablet 2,000 mcg  2,000 mcg Oral Daily Fritzi Mandes, MD   2,000 mcg at 04/14/17 0802    Musculoskeletal: Strength & Muscle Tone: within normal limits Gait & Station: normal Patient leans:   Psychiatric Specialty Exam: Physical Exam  Nursing note and vitals reviewed.   ROS  Blood pressure 119/74, pulse 92, temperature 98 F (36.7 C), temperature source Oral, resp. rate 16, height 5' 6" (1.676 m), weight 68.5 kg (151 lb), SpO2 98 %.Body mass index is 24.37 kg/m.  General Appearance: Disheveled  Eye Contact:  Poor  Speech:  Slow  Volume:  Increased  Mood:  Anxious, Dysphoric and Irritable  Affect:  irritable  Thought Process:  Disorganized  Orientation:  Self and hospital  Thought Content:  anxious  Suicidal Thoughts:  No  Homicidal Thoughts:  No  Memory:  impaired  Judgement:  Impaired  Insight:  Lacking  Psychomotor Activity:  Increased  Concentration:  poor  Recall:  UTA  Fund of Knowledge:  UTA  Language:  Fair  Akathisia:  No  Handed:    AIMS (if indicated):     Assets:    ADL's:  poor  Cognition:  impaired  Sleep:        Treatment Plan Summary: Pt with h/o depression and anxiety appears delirious at this time. Pt denies SI/HI. Collateral could not be reached at this time.  Recommend- 1. Continue ativan and thiamine.  2. May try haldol 0.5  mg po bid  for agitation  2. please call  us  when he improves from  delirium  to evaluate psych needs . 3, call with questions.     Lenward Chancellor, MD 04/14/2017 5:32 PM

## 2017-04-14 NOTE — Consult Note (Signed)
Reason for Consult: encephalopathy  Referring Physician: Dr. Jetty Duhamel   CC: confusion   HPI: Marco [name] is an 62 y.o. male with a known history of smoking, hypertension, history of seizure disorder, COPD recently admitted with hypoxic respiratory failure on BiPAP On  chest x-rays suggestive of ARDS/pneumonia. Neurology consulted for confusion and encephalopathy. Pt has hx of seizures and Is on Keppra.    Past Medical History:  Diagnosis Date  . Anxiety   . Arthritis   . Depression   . Heartburn   . Seizures ([facility])     Past Surgical History:  Procedure Laterality Date  . KNEE ARTHROSCOPY  1975    Family History  Problem Relation Age of Onset  . Diabetes Mellitus II Mother   . Breast cancer Mother   . Kidney disease Neg Hx   . Prostate cancer Neg Hx     Social History:  reports that he has been smoking.  He has been smoking about 1.00 pack per day. He has never used smokeless tobacco. He reports that he drinks about 2.4 oz of alcohol per week. He reports that he does not use drugs.  Allergies  Allergen Reactions  . Hydrocodone-Acetaminophen Nausea Only and Nausea And Vomiting    Medications: I have reviewed the patient's current medications.  ROS: Unable to obtain due to confusion   Physical Examination: Blood pressure 137/67, pulse 97, temperature 97.9 F (36.6 C), temperature source Oral, resp. rate 19, height 5\' 6"  (1.676 m), weight 151 lb (68.5 kg), SpO2 93 %.   Neurological Examination   Mental Status: Only able to tell me name.  Cranial Nerves: II: Discs flat bilaterally; Visual fields grossly normal, pupils equal, round, reactive to light and accommodation III,IV, VI: ptosis not present, extra-ocular motions intact bilaterally V,VII: smile symmetric, facial light touch sensation normal bilaterally VIII: hearing normal bilaterally IX,X: gag reflex present XI: bilateral shoulder shrug XII: midline tongue extension Motor: Right : Upper extremity    4/5    Left:     Upper extremity   4/5  Lower extremity   4/5     Lower extremity   4/5 Tone and bulk:normal tone throughout; no atrophy noted Sensory: Pinprick and light touch intact throughout, bilaterally but does not cooperate well Deep Tendon Reflexes: 1+ and symmetric throughout Plantars: Right: downgoing   Left: downgoing Cerebellar: Not tested Gait: not tested       Laboratory Studies:   Basic Metabolic Panel: Recent Labs  Lab 04/08/17 0533 04/09/17 0436 04/10/17 0636 04/12/17 0435 04/13/17 0331 04/14/17 0338  NA 142 139 139 142  --  138  K 4.4 4.4 4.3 4.2  --  3.4*  CL 111 108 105 98*  --  95*  CO2 26 26 31  39*  --  37*  GLUCOSE 155* 137* 99 129*  --  134*  BUN 20 23* 24* 20  --  23*  CREATININE 0.74 0.80 0.65 0.48* 0.66 0.86  CALCIUM 8.8* 9.1 8.9 9.0  --  9.1  MG 2.2  --   --   --   --  1.7  PHOS 4.1  --   --   --   --   --     Liver Function Tests: Recent Labs  Lab 04/09/17 0436  AST 17  ALT 13*  ALKPHOS 74  BILITOT 0.7  PROT 6.1*  ALBUMIN 2.5*   No results for input(s): LIPASE, AMYLASE in the last 168 hours. Recent Labs  Lab 04/12/17 1609  AMMONIA  16    CBC: Recent Labs  Lab 04/08/17 0533 04/09/17 0436  WBC 13.2* 9.1  HGB 7.4* 7.8*  HCT 23.0* 23.9*  MCV 102.4* 102.3*  PLT 269 285    Cardiac Enzymes: No results for input(s): CKTOTAL, CKMB, CKMBINDEX, TROPONINI in the last 168 hours.  BNP: Invalid input(s): POCBNP  CBG: No results for input(s): GLUCAP in the last 168 hours.  Microbiology: Results for orders placed or performed during the hospital encounter of 04/06/17  Blood Culture (routine x 2)     Status: None   Collection Time: 04/06/17 10:45 AM  Result Value Ref Range Status   Specimen Description BLOOD RT WRIST  Final   Special Requests   Final    BOTTLES DRAWN AEROBIC AND ANAEROBIC Blood Culture adequate volume   Culture   Final    NO GROWTH 5 DAYS Performed at [facility], [address]  [place], [place] [address]    Report Status 04/11/2017 FINAL  Final  Blood Culture (routine x 2)     Status: None   Collection Time: 04/06/17 10:45 AM  Result Value Ref Range Status   Specimen Description BLOOD LT HAND  Final   Special Requests   Final    BOTTLES DRAWN AEROBIC AND ANAEROBIC Blood Culture adequate volume   Culture   Final    NO GROWTH 5 DAYS Performed at [facility], [address], [place], [place] [address]    Report Status 04/11/2017 FINAL  Final  MRSA PCR Screening     Status: None   Collection Time: 04/06/17  8:18 PM  Result Value Ref Range Status   MRSA by PCR NEGATIVE NEGATIVE Final    Comment:        The GeneXpert MRSA Assay (FDA approved for NASAL specimens only), is one component of a comprehensive MRSA colonization surveillance program. It is not intended to diagnose MRSA infection nor to guide or monitor treatment for MRSA infections. Performed at [facility], [address] [place], [place] [address]     Coagulation Studies: No results for input(s): LABPROT, INR in the last 72 hours.  Urinalysis: No results for input(s): COLORURINE, LABSPEC, PHURINE, GLUCOSEU, HGBUR, BILIRUBINUR, KETONESUR, PROTEINUR, UROBILINOGEN, NITRITE, LEUKOCYTESUR in the last 168 hours.  Invalid input(s): APPERANCEUR  Lipid Panel:     Component Value Date/Time   CHOL 126 01/22/2011 0446   TRIG 91 01/22/2011 0446   HDL 55 01/22/2011 0446   VLDL 18 01/22/2011 0446   LDLCALC 53 01/22/2011 0446    HgbA1C:  Lab Results  Component Value Date   HGBA1C 5.2 01/22/2011    Urine Drug Screen:      Component Value Date/Time   LABOPIA NONE DETECTED 04/14/2017 0033   COCAINSCRNUR NONE DETECTED 04/14/2017 0033   LABBENZ POSITIVE (A) 04/14/2017 0033   AMPHETMU NONE DETECTED 04/14/2017 0033   THCU NONE DETECTED 04/14/2017 0033   LABBARB NONE DETECTED 04/14/2017 0033    Alcohol Level: No results for input(s): ETH in the last 168 hours.  Other  results: EKG: normal EKG, normal sinus rhythm, unchanged from previous tracings.  Imaging: Ct Head Wo Contrast  Result Date: 04/12/2017 CLINICAL DATA:  62 year old with acute confusion. EXAM: CT HEAD WITHOUT CONTRAST TECHNIQUE: Contiguous axial images were obtained from the base of the skull through the vertex without intravenous contrast. COMPARISON:  03/31/2017 FINDINGS: Brain: No evidence for acute hemorrhage, mass lesion, midline shift, hydrocephalus or large infarct. Vascular: No hyperdense vessel or unexpected calcification. Skull: Normal.  Negative for fracture or focal lesion. Sinuses/Orbits: There is a polyp or retention cyst in the right maxillary sinus. Stable partial opacification in the left mastoid air cells. Other: None IMPRESSION: No acute intracranial abnormality. Electronically Signed   By: Richarda OverlieAdam  Henn M.D.   On: 04/12/2017 15:48   Mr Brain Wo Contrast  Result Date: 04/13/2017 CLINICAL DATA:  Altered level of consciousness. EXAM: MRI HEAD WITHOUT CONTRAST TECHNIQUE: Multiplanar, multiecho pulse sequences of the brain and surrounding structures were obtained without intravenous contrast. COMPARISON:  Head CT from yesterday FINDINGS: Brain: No acute infarction, hemorrhage, hydrocephalus, extra-axial collection or mass lesion. Mild generalized cerebral volume loss. Generalized volume loss has occurred since 2013 comparison. Very mild periventricular FLAIR hyperintensity. Vascular: Major flow voids are preserved. Skull and upper cervical spine: Stable marrow compared to 2013. No focal lesion is seen. Sinuses/Orbits: Chronic left mastoid opacification. Minimal right mastoid opacification. Negative nasopharynx. IMPRESSION: 1. No acute finding. 2. Mild atrophy that has developed since 2013 comparison. Electronically Signed   By: Marnee SpringJonathon  Watts M.D.   On: 04/13/2017 14:10     Assessment/Plan:  62 y.o. male with a known history of smoking, hypertension, history of seizure disorder, COPD  recently admitted with hypoxic respiratory failure on BiPAP On  chest x-rays suggestive of ARDS/pneumonia. Neurology consulted for confusion and encephalopathy. Pt has hx of seizures and Is on Keppra.   MRI completed of brain without any abnormalities Current encephalopathy/delirium is likely metabolic in nature He has no focal deficits Con't care per primary team Do not think there is a need for any further imagine Call with questions.  04/14/2017, 10:41 AM

## 2017-04-15 ENCOUNTER — Other Ambulatory Visit: Payer: Self-pay | Admitting: *Deleted

## 2017-04-15 DIAGNOSIS — J189 Pneumonia, unspecified organism: Secondary | ICD-10-CM

## 2017-04-15 MED ORDER — FUROSEMIDE 20 MG PO TABS
20.0000 mg | ORAL_TABLET | Freq: Every day | ORAL | Status: DC
  Administered 2017-04-15 – 2017-04-16 (×2): 20 mg via ORAL
  Filled 2017-04-15 (×2): qty 1

## 2017-04-15 MED ORDER — POTASSIUM CHLORIDE 20 MEQ PO PACK
10.0000 meq | PACK | Freq: Every day | ORAL | Status: DC
  Administered 2017-04-15 – 2017-04-16 (×2): 10 meq via ORAL
  Filled 2017-04-15 (×2): qty 1

## 2017-04-15 MED ORDER — ALBUTEROL SULFATE (2.5 MG/3ML) 0.083% IN NEBU: 2.5000 mg | INHALATION_SOLUTION | RESPIRATORY_TRACT | Status: DC | PRN

## 2017-04-15 MED ORDER — MAGNESIUM OXIDE 400 (241.3 MG) MG PO TABS
400.0000 mg | ORAL_TABLET | Freq: Every day | ORAL | Status: DC
  Administered 2017-04-15 – 2017-04-16 (×2): 400 mg via ORAL
  Filled 2017-04-15 (×2): qty 1

## 2017-04-15 MED ORDER — METHYLPREDNISOLONE SODIUM SUCC 40 MG IJ SOLR
40.0000 mg | Freq: Three times a day (TID) | INTRAMUSCULAR | Status: DC
  Administered 2017-04-15 – 2017-04-16 (×3): 40 mg via INTRAVENOUS
  Filled 2017-04-15 (×3): qty 1

## 2017-04-15 MED ORDER — ALBUTEROL SULFATE (2.5 MG/3ML) 0.083% IN NEBU
2.5000 mg | INHALATION_SOLUTION | Freq: Three times a day (TID) | RESPIRATORY_TRACT | Status: DC
  Administered 2017-04-15 – 2017-04-16 (×4): 2.5 mg via RESPIRATORY_TRACT
  Filled 2017-04-15 (×6): qty 3

## 2017-04-15 NOTE — Care Management Important Message (Signed)
Important Message  Patient Details  Name: Marco [name] MRN: [id number] Date of Birth: 1955-04-20   Medicare Important Message Given:  Yes    Olegario MessierKathy A Darril Patriarca 04/15/2017, 12:37 PM

## 2017-04-15 NOTE — Progress Notes (Signed)
[facility] - [place] [facility]   PATIENT NAME: Marco [name]    MR#:  [id number]  DATE OF BIRTH:  October 18, 1955  SUBJECTIVE:  CHIEF COMPLAINT:   Chief Complaint  Patient presents with  . Respiratory Distress  Patient without complaint, sitting at side of the bed, and in discussion with pulmonology-we will need bronc for further evaluation  REVIEW OF SYSTEMS:  CONSTITUTIONAL: No fever, fatigue or weakness.  EYES: No blurred or double vision.  EARS, NOSE, AND THROAT: No tinnitus or ear pain.  RESPIRATORY: No cough, shortness of breath, wheezing or hemoptysis.  CARDIOVASCULAR: No chest pain, orthopnea, edema.  GASTROINTESTINAL: No nausea, vomiting, diarrhea or abdominal pain.  GENITOURINARY: No dysuria, hematuria.  ENDOCRINE: No polyuria, nocturia,  HEMATOLOGY: No anemia, easy bruising or bleeding SKIN: No rash or lesion. MUSCULOSKELETAL: No joint pain or arthritis.   NEUROLOGIC: No tingling, numbness, weakness.  PSYCHIATRY: No anxiety or depression.   ROS  DRUG ALLERGIES:   Allergies  Allergen Reactions  . Hydrocodone-Acetaminophen Nausea Only and Nausea And Vomiting    VITALS:  Blood pressure 103/74, pulse 81, temperature 97.7 F (36.5 C), temperature source Oral, resp. rate 20, height 5\' 6"  (1.676 m), weight 68.5 kg (151 lb), SpO2 100 %.  PHYSICAL EXAMINATION:  GENERAL:  62 y.o.-year-old patient lying in the bed with no acute distress.  EYES: Pupils equal, round, reactive to light and accommodation. No scleral icterus. Extraocular muscles intact.  HEENT: Head atraumatic, normocephalic. Oropharynx and nasopharynx clear.  NECK:  Supple, no jugular venous distention. No thyroid enlargement, no tenderness.  LUNGS: Normal breath sounds bilaterally, no wheezing, rales,rhonchi or crepitation. No use of accessory muscles of respiration.  CARDIOVASCULAR: S1, S2 normal. No murmurs, rubs, or gallops.  ABDOMEN: Soft, nontender, nondistended. Bowel sounds  present. No organomegaly or mass.  EXTREMITIES: No pedal edema, cyanosis, or clubbing.  NEUROLOGIC: Cranial nerves II through XII are intact. Muscle strength 5/5 in all extremities. Sensation intact. Gait not checked.  PSYCHIATRIC: The patient is alert and oriented x 3.  SKIN: No obvious rash, lesion, or ulcer.   Physical Exam LABORATORY PANEL:   CBC Recent Labs  Lab 04/09/17 0436  WBC 9.1  HGB 7.8*  HCT 23.9*  PLT 285   ------------------------------------------------------------------------------------------------------------------  Chemistries  Recent Labs  Lab 04/09/17 0436  04/14/17 0338  NA 139   < > 138  K 4.4   < > 3.4*  CL 108   < > 95*  CO2 26   < > 37*  GLUCOSE 137*   < > 134*  BUN 23*   < > 23*  CREATININE 0.80   < > 0.86  CALCIUM 9.1   < > 9.1  MG  --   --  1.7  AST 17  --   --   ALT 13*  --   --   ALKPHOS 74  --   --   BILITOT 0.7  --   --    < > = values in this interval not displayed.   ------------------------------------------------------------------------------------------------------------------  Cardiac Enzymes No results for input(s): TROPONINI in the last 168 hours. ------------------------------------------------------------------------------------------------------------------  RADIOLOGY:  Dg Chest 2 View  Result Date: 04/14/2017 CLINICAL DATA:  Hypoxic respiratory failure, on BiPAP, history hypertension, seizure disorder, COPD EXAM: CHEST - 2 VIEW COMPARISON:  04/12/2017 FINDINGS: Normal heart size mediastinal contours. Interval improvement in pulmonary infiltrates. No pleural effusion or pneumothorax. Bones unremarkable. IMPRESSION: Improving pulmonary infiltrates. Electronically Signed   By: Angelyn PuntMark  Boles M.D.  On: 04/14/2017 18:49   Mr Brain Wo Contrast  Result Date: 04/13/2017 CLINICAL DATA:  Altered level of consciousness. EXAM: MRI HEAD WITHOUT CONTRAST TECHNIQUE: Multiplanar, multiecho pulse sequences of the brain and surrounding  structures were obtained without intravenous contrast. COMPARISON:  Head CT from yesterday FINDINGS: Brain: No acute infarction, hemorrhage, hydrocephalus, extra-axial collection or mass lesion. Mild generalized cerebral volume loss. Generalized volume loss has occurred since 2013 comparison. Very mild periventricular FLAIR hyperintensity. Vascular: Major flow voids are preserved. Skull and upper cervical spine: Stable marrow compared to 2013. No focal lesion is seen. Sinuses/Orbits: Chronic left mastoid opacification. Minimal right mastoid opacification. Negative nasopharynx. IMPRESSION: 1. No acute finding. 2. Mild atrophy that has developed since 2013 comparison. Electronically Signed   By: Marnee Spring M.D.   On: 04/13/2017 14:10    ASSESSMENT AND PLAN:  MichaelHodgesis a61 y.o.malewith a known history of smoking, hypertension, history of seizure disorder, COPD recently admitted with hypoxic respiratory failure remained on BiPAP chest x-rays suggestive of ARDS/pneumonia was discharged to [facility] five days ago comes back with shortness of breath and lethargic.  1.Acute encephalopathy/confusion Resolved  most likely due to acute on chronichypoxicrespiratory failure with hypoxia secondary to COPD exacerbation,withpneumonitis ammonialevel normal, rprnonreactive, CTHdid not show any acute process, MRI of the brain unimpressive, urine drug screen noted for benzodiazepines only, neurology did see patient while in house-no intervention recommended  2.Acute on chronic hypoxic respiratory failure Resolving Secondary to acute on COPD exacerbation with pneumonitis Successfully weaned off BiPAP, wean O2 off as tolerated, IV Solu-Medrol with tapering as tolerated, pulmonology/Dr. Ramachandranrecommending bronchoscopy for further evaluation   2.hypertension Stable on current regiment  3.Seizure disorder Stable onKeppra  4. Anemia of chronic  disease Stable  5.Chronic tobacco smoking abuse/dependency Cessation counseling and nicotine patch   All the records are reviewed and case discussed with Care Management/Social Workerr. Management plans discussed with the patient, family and they are in agreement.  CODE STATUS: full  TOTAL TIME TAKING CARE OF THIS PATIENT: 35 minutes.     POSSIBLE D/C IN 1-3 DAYS, DEPENDING ON CLINICAL CONDITION.   Evelena Asa Harmonee Tozer M.D on 04/15/2017   Between 7am to 6pm - Pager - [phone]  After 6pm go to [email or web address] - password [email or web address]  [facility]  Office  [phone]  CC: Primary care physician; Jerl Mina, MD  Note: This dictation was prepared with Dragon dictation along with smaller phrase technology. Any transcriptional errors that result from this process are unintentional.

## 2017-04-15 NOTE — Progress Notes (Signed)
[facility]     Assessment and Plan:  Acute respiratory failure, hypoxic. -achypnea, though appears improved. -- Remains on [place] at 2L.   Diffuse interstitial changes consistent with pneumonitis, etiology uncertain.  -Currently on Solu-Medrol 80 mg 3 times daily, will decrease dose. --D/w hospitalist, would need bronch, however no bronch suite availability until later this week. If the patient is otherwise stable, could be discharged and bronch could be done outpatient. Continue prednisone 40 mg daily upon discharge, follow up outpatient in 1 week with repeat CXR.   Nicotine abuse. --Smoking cessation.   Date: 04/15/2017  MRN# [id number] Kace Hartje 12/18/1955   Rembert Browe is a 62 y.o. old male seen in follow up for chief complaint of  Chief Complaint  Patient presents with  . Respiratory Distress     Subjective Pt confused today. No new complaints, appears tachypneic but better than yesterday.  Imaging personally reviewed, CXR shows improved infiltrates.   Pt has continued on solumedrol 80 mg q8.  CXR 04/14/17 images personally reviewed; appears improved with less bilateral infiltrates.   Medication:    Current Facility-Administered Medications:  .  acetaminophen (TYLENOL) tablet 650 mg, 650 mg, Oral, Q6H PRN **OR** acetaminophen (TYLENOL) suppository 650 mg, 650 mg, Rectal, Q6H PRN, Enedina Finner, MD .  albuterol (PROVENTIL) (2.5 MG/3ML) 0.083% nebulizer solution 2.5 mg, 2.5 mg, Nebulization, TID, Salary, Montell D, MD, 2.5 mg at 04/15/17 1319 .  albuterol (PROVENTIL) (2.5 MG/3ML) 0.083% nebulizer solution 2.5 mg, 2.5 mg, Nebulization, Q4H PRN, Salary, Montell D, MD .  arformoterol (BROVANA) nebulizer solution 15 mcg, 15 mcg, Nebulization, BID, Salary, Montell D, MD, 15 mcg at 04/15/17 0811 .  budesonide (PULMICORT) nebulizer solution 0.5 mg, 0.5 mg, Nebulization, Q12H, Kasa, Kurian, MD, 0.5 mg at 04/15/17 0800 .  enoxaparin (LOVENOX) injection 40  mg, 40 mg, Subcutaneous, Q24H, Shaune Pollack, MD, 40 mg at 04/14/17 1637 .  ferrous sulfate tablet 325 mg, 325 mg, Oral, BID WC, Tukov, Magadalene S, NP, 325 mg at 04/15/17 0914 .  folic acid (FOLVITE) tablet 1 mg, 1 mg, Oral, Daily, Billy Fischer B, MD, 1 mg at 04/15/17 0913 .  furosemide (LASIX) tablet 20 mg, 20 mg, Oral, Daily, Salary, Montell D, MD, 20 mg at 04/15/17 1324 .  levETIRAcetam (KEPPRA) 100 MG/ML solution 750 mg, 750 mg, Oral, BID, Hallaji, Sheema M, RPH, 750 mg at 04/15/17 0913 .  LORazepam (ATIVAN) tablet 0.5 mg, 0.5 mg, Oral, Q8H PRN, Salary, Montell D, MD, 0.5 mg at 04/14/17 1200 .  LORazepam (ATIVAN) tablet 1 mg, 1 mg, Oral, Once, Salary, Montell D, MD .  magnesium oxide (MAG-OX) tablet 400 mg, 400 mg, Oral, Daily, Salary, Montell D, MD, 400 mg at 04/15/17 1324 .  MEDLINE mouth rinse, 15 mL, Mouth Rinse, BID, Kasa, Kurian, MD, 15 mL at 04/15/17 0915 .  methylPREDNISolone sodium succinate (SOLU-MEDROL) 125 mg/2 mL injection 80 mg, 80 mg, Intravenous, TID, Shane Crutch, MD, 80 mg at 04/15/17 0913 .  metolazone (ZAROXOLYN) tablet 2.5 mg, 2.5 mg, Oral, Once, Salary, Montell D, MD .  metoprolol tartrate (LOPRESSOR) tablet 12.5 mg, 12.5 mg, Oral, BID, Enedina Finner, MD, 12.5 mg at 04/15/17 0914 .  multivitamin with minerals tablet 1 tablet, 1 tablet, Oral, Daily, Enedina Finner, MD, 1 tablet at 04/15/17 0913 .  nicotine (NICODERM CQ - dosed in mg/24 hours) patch 21 mg, 21 mg, Transdermal, Daily, Enedina Finner, MD, 21 mg at 04/15/17 0913 .  ondansetron (ZOFRAN) tablet 4 mg, 4 mg, Oral,  Q6H PRN **OR** ondansetron (ZOFRAN) injection 4 mg, 4 mg, Intravenous, Q6H PRN, Enedina Finner, MD .  pantoprazole (PROTONIX) EC tablet 40 mg, 40 mg, Oral, Daily, Enedina Finner, MD, 40 mg at 04/15/17 0914 .  polyethylene glycol (MIRALAX / GLYCOLAX) packet 17 g, 17 g, Oral, Daily, Tukov, Magadalene S, NP, 17 g at 04/15/17 0913 .  potassium chloride (KLOR-CON) packet 10 mEq, 10 mEq, Oral, Daily, Salary, Montell  D, MD, 10 mEq at 04/15/17 1324 .  senna-docusate (Senokot-S) tablet 2 tablet, 2 tablet, Oral, BID, Erin Fulling, MD, 2 tablet at 04/15/17 0914 .  tamsulosin (FLOMAX) capsule 0.4 mg, 0.4 mg, Oral, Daily, Enedina Finner, MD, 0.4 mg at 04/15/17 0913 .  thiamine (VITAMIN B-1) tablet 100 mg, 100 mg, Oral, Daily, Enedina Finner, MD, 100 mg at 04/15/17 0914 .  vitamin B-12 (CYANOCOBALAMIN) tablet 2,000 mcg, 2,000 mcg, Oral, Daily, Enedina Finner, MD, 2,000 mcg at 04/15/17 6962   Allergies:  Hydrocodone-acetaminophen  Review of Systems: Gen:  Denies  fever, sweats. HEENT: Denies blurred vision. Cvc:  No dizziness, chest pain or heaviness Resp:   Denies cough or sputum porduction. Other:  All other systems were reviewed and found to be negative other than what is mentioned in the HPI.   Physical Examination:   VS: BP 103/74 (BP Location: Left Arm)   Pulse 81   Temp 97.7 F (36.5 C) (Oral)   Resp 20   Ht 5\' 6"  (1.676 m)   Wt 151 lb (68.5 kg)   SpO2 100%   BMI 24.37 kg/m    General Appearance: Patient appears dyspneic with accessory muscle use but better than yesterday.   Neuro:without focal findings,  speech normal,  HEENT: PERRLA, EOM intact. Pulmonary: Bilateral coarse rhonchi and wheezing, improved from previous.  CardiovascularNormal S1,S2.  No m/r/g.   Abdomen: Benign, Soft, non-tender. Renal:  No costovertebral tenderness  GU:  Not performed at this time. Endoc: No evident thyromegaly, no signs of acromegaly. Skin:   warm, no rash. Extremities: normal, no cyanosis, clubbing.   LABORATORY PANEL:   CBC Recent Labs  Lab 04/09/17 0436  WBC 9.1  HGB 7.8*  HCT 23.9*  PLT 285   ------------------------------------------------------------------------------------------------------------------  Chemistries  Recent Labs  Lab 04/09/17 0436  04/14/17 0338  NA 139   < > 138  K 4.4   < > 3.4*  CL 108   < > 95*  CO2 26   < > 37*  GLUCOSE 137*   < > 134*  BUN 23*   < > 23*    CREATININE 0.80   < > 0.86  CALCIUM 9.1   < > 9.1  MG  --   --  1.7  AST 17  --   --   ALT 13*  --   --   ALKPHOS 74  --   --   BILITOT 0.7  --   --    < > = values in this interval not displayed.   ------------------------------------------------------------------------------------------------------------------  Cardiac Enzymes No results for input(s): TROPONINI in the last 168 hours. ------------------------------------------------------------  RADIOLOGY:   No results found for this or any previous visit. Results for orders placed during the hospital encounter of 03/25/17  DG Chest 2 View   Narrative CLINICAL DATA:  SOB  EXAM: CHEST - 2 VIEW  COMPARISON:  03/27/2017  FINDINGS: Bilateral diffuse interstitial and alveolar airspace opacities. No pleural effusion or pneumothorax. Normal cardiomediastinal silhouette. No acute osseous abnormality.  IMPRESSION: 1. Bilateral diffuse interstitial and  alveolar airspace opacities which may reflect pulmonary edema versus multilobar pneumonia.   Electronically Signed   By: Hetal  Patel   On: 03/28/2017 10:34    --------------------------Elige Ko----------------------------------------------------------------------------------------  Thank  you for allowing [facility] to assist in the care of your patient. Our recommendations are noted above.  Please contact us if we can be of further service.   Wells Guileseep Rocklin Soderquist, MD.  [facility] Office Number: [phone]  Santiago Gladavid Kasa, M.D.  Billy Fischeravid Simonds, M.D  04/15/2017

## 2017-04-15 NOTE — Progress Notes (Signed)
o2 sats on room air at rest 100%, exertion on room air 96%

## 2017-04-15 NOTE — Progress Notes (Signed)
Physical Therapy Treatment Patient Details Name: Marco [name] MRN: [id number] DOB: 1955/12/30 Today's Date: 04/15/2017    History of Present Illness Marco [name] is a 62 y.o. male with a history of smoking, HTN, and seizure disorder who was recently discharged from the hospital 5 days ago after being admittedHCAP/ ARDS who presents for evaluation of shortness of breath. Patient reports that he never felt better when he went home. Started feeling worse over the last 24 hours. Patient continues to have cough and fever at home. No vomiting or diarrhea. He is complaining of severe constant shortness of breath. No chest pain, no abdominal pain. He endorses compliance with his antibiotics at home. Patient was hypoxic to 85% per EMS. Patient was given 2 albuterol treatments, one duoneb, and 125 mg of Solu-Medrol per EMS. Pt is now admitted for acute on chronic respiratory failure with hypoxia secondary to COPD exacerbation.    PT Comments    Pt presents with deficits in strength, transfers, mobility, gait, balance, and activity tolerance.  Pt's SpO2 at rest on 2LO2/min 100% with HR in the mid 90s.  Nsg notified and requested trial with PT with patient on room air.   During supine therex and amb on room air per below pt's SpO2 remained >/= 96 % with HR 100-106 bpm.  Pt reported fatigue and general weakness during activity but no other adverse symptoms reported.  Pt left on room air at the end of the session with nsg notified.  Overall pt remains functionally weak with poor activity tolerance and is at an elevated risk for falls. Pt will benefit from PT services in a SNF setting upon discharge to safely address above deficits for decreased caregiver assistance and eventual return to PLOF.      Follow Up Recommendations  SNF     Equipment Recommendations       Recommendations for Other Services       Precautions / Restrictions Precautions Precautions: Fall Precaution Comments: Seizure  disorder Restrictions Weight Bearing Restrictions: No    Mobility  Bed Mobility Overal bed mobility: Needs Assistance Bed Mobility: Supine to Sit;Sit to Supine     Supine to sit: Supervision Sit to supine: Supervision   General bed mobility comments: Extra time and effort with bed mobility tasks with min verbal cues for sequencing but no physical assistance required  Transfers Overall transfer level: Needs assistance Equipment used: None Transfers: Sit to/from Stand Sit to Stand: Min guard         General transfer comment: Pt effortful with standing but no physical assistance required  Ambulation/Gait   Ambulation Distance (Feet): 5 Feet Assistive device: 1 person hand held assist Gait Pattern/deviations: Step-through pattern;Decreased step length - right;Decreased step length - left;Trunk flexed Gait velocity: reduced   General Gait Details: Pt fatigued quickly with amb and required to return to sitting; pt's baseline SpO2 and HR on room air were 100% and 98 bpm and post ambulation were 96% and 106 bpm   Stairs             Wheelchair Mobility    Modified Rankin (Stroke Patients Only)       Balance Overall balance assessment: Needs assistance Sitting-balance support: Feet supported;Bilateral upper extremity supported Sitting balance-Leahy Scale: Fair     Standing balance support: Bilateral upper extremity supported Standing balance-Leahy Scale: Fair  Cognition Arousal/Alertness: Awake/alert Behavior During Therapy: Flat affect Overall Cognitive Status: No family/caregiver present to determine baseline cognitive functioning                                        Exercises Total Joint Exercises Ankle Circles/Pumps: Both;5 reps;10 reps;Strengthening Quad Sets: Strengthening;Both;5 reps;10 reps Gluteal Sets: Strengthening;Both;10 reps;5 reps Hip ABduction/ADduction: AROM;Both;10 reps Straight  Leg Raises: AROM;Both;10 reps Long Arc Quad: AROM;Both;10 reps Knee Flexion: AROM;Both;10 reps Marching in Standing: AROM;Both;Standing;5 reps    General Comments        Pertinent Vitals/Pain Pain Assessment: No/denies pain    Home Living                      Prior Function            PT Goals (current goals can now be found in the care plan section) Progress towards PT goals: Progressing toward goals    Frequency    Min 2X/week      PT Plan Current plan remains appropriate    Co-evaluation              AM-PAC PT "6 Clicks" Daily Activity  Outcome Measure                   End of Session Equipment Utilized During Treatment: Gait belt Activity Tolerance: Patient limited by fatigue Patient left: in bed;with call bell/phone within reach;with bed alarm set(Pt declined up in chair; pt educated on benefits of OOB with pt stating would get out of bed with nsg later) Nurse Communication: Mobility status PT Visit Diagnosis: Muscle weakness (generalized) (M62.81);Difficulty in walking, not elsewhere classified (R26.2)     Time: 1610-9604 PT Time Calculation (min) (ACUTE ONLY): 23 min  Charges:  $Therapeutic Exercise: 8-22 mins $Therapeutic Activity: 8-22 mins                    G Codes:       DElly Modena PT, DPT 04/15/17, 3:13 PM

## 2017-04-15 NOTE — Clinical Social Work Note (Signed)
Patient not yet medically stable for discharge. Plan is for him to return to [facility] when time. [place] SpanielMonica Alianna Wurster MSW,LCSW [phone]

## 2017-04-16 LAB — BASIC METABOLIC PANEL WITH GFR
Anion gap: 3 — ABNORMAL LOW (ref 5–15)
BUN: 29 mg/dL — ABNORMAL HIGH (ref 6–20)
CO2: 32 mmol/L (ref 22–32)
Calcium: 8.8 mg/dL — ABNORMAL LOW (ref 8.9–10.3)
Chloride: 105 mmol/L (ref 101–111)
Creatinine, Ser: 0.69 mg/dL (ref 0.61–1.24)
GFR calc Af Amer: >60 mL/min (ref >60)
GFR calc non Af Amer: >60 mL/min (ref >60)
Glucose, Bld: 108 mg/dL — ABNORMAL HIGH (ref 65–99)
Potassium: 4.1 mmol/L (ref 3.5–5.1)
Sodium: 140 mmol/L (ref 135–145)

## 2017-04-16 MED ORDER — MAGNESIUM OXIDE 400 (241.3 MG) MG PO TABS: 400.0000 mg | ORAL_TABLET | Freq: Every day | ORAL | 0 refills | Status: DC

## 2017-04-16 MED ORDER — PREDNISONE 20 MG PO TABS: 40.0000 mg | ORAL_TABLET | Freq: Every day | ORAL | 0 refills | Status: DC

## 2017-04-16 MED ORDER — FUROSEMIDE 20 MG PO TABS: 20.0000 mg | ORAL_TABLET | Freq: Every day | ORAL | 0 refills | Status: DC

## 2017-04-16 MED ORDER — FOLIC ACID 1 MG PO TABS: 1.0000 mg | ORAL_TABLET | Freq: Every day | ORAL | 0 refills | Status: DC

## 2017-04-16 MED ORDER — FERROUS SULFATE 325 (65 FE) MG PO TABS: 325.0000 mg | ORAL_TABLET | Freq: Two times a day (BID) | ORAL | 0 refills | Status: DC

## 2017-04-16 MED ORDER — POTASSIUM CHLORIDE 20 MEQ PO PACK: 10.0000 meq | PACK | Freq: Every day | ORAL | 0 refills | Status: DC

## 2017-04-16 MED ORDER — QUETIAPINE FUMARATE 25 MG PO TABS
25.0000 mg | ORAL_TABLET | Freq: Once | ORAL | Status: AC
  Administered 2017-04-16: 25 mg via ORAL
  Filled 2017-04-16: qty 1

## 2017-04-16 MED ORDER — CLONAZEPAM 0.5 MG PO TABS: 0.5000 mg | ORAL_TABLET | Freq: Two times a day (BID) | ORAL | 0 refills | Status: DC | PRN

## 2017-04-16 MED ORDER — ARFORMOTEROL TARTRATE 15 MCG/2ML IN NEBU: 15.0000 ug | INHALATION_SOLUTION | Freq: Two times a day (BID) | RESPIRATORY_TRACT | 0 refills | Status: DC

## 2017-04-16 NOTE — Discharge Summary (Signed)
[facility] [place] [facility]   PATIENT NAME: Marco RegisterMichael [name]    MR#:  [id number]  DATE OF BIRTH:  [birth year]  DATE OF ADMISSION:  04/06/2017 ADMITTING PHYSICIAN: Enedina FinnerSona Patel, MD  DATE OF DISCHARGE: No discharge date for patient encounter.  PRIMARY CARE PHYSICIAN: Jerl MinaHedrick, James, MD    ADMISSION DIAGNOSIS:  Acute respiratory failure with hypoxia (HCC) [J96.01] HCAP (healthcare-associated pneumonia) [J18.9] Sepsis, due to unspecified organism [facility](HCC) [A41.9] Acute on chronic respiratory failure with hypoxia (HCC) [J96.21]  DISCHARGE DIAGNOSIS:  Active Problems:   Acute on chronic respiratory failure with hypoxia (HCC)   Pressure injury of skin   Acute delirium   SECONDARY DIAGNOSIS:   Past Medical History:  Diagnosis Date  . Anxiety   . Arthritis   . Depression   . Heartburn   . Seizures [facility](HCC)     HOSPITAL COURSE:  MichaelHodgesis a61 y.o.malewith a known history of smoking, hypertension, history of seizure disorder, COPD recently admitted with hypoxic respiratory failure remained on BiPAP chest x-rays suggestive of ARDS/pneumonia was [facility] five days ago comes back with shortness of breath and lethargic.  1.Acute encephalopathy/confusion Resolved  most likely due to acute on chronichypoxicrespiratory failure with hypoxia secondary to COPD exacerbation,withpneumonitis ammonialevel normal, rprnonreactive, CTHdid not show any acute process,MRI of the brain unimpressive, urine drug screen noted for benzodiazepines only,neurology did see patient while in house-no intervention recommended  2.Acute on chronic hypoxic respiratory failure Resolved Secondary to acute on COPD exacerbation with pneumonitis Successfully weaned off BiPAP and oxygen, case discussed with pulmonology/Dr. Ramachandran-recommended prednisone 40 mg daily/outpatient follow-up with bronchoscopy in 1 week for reevaluation     2.hypertension Stable on current regiment  3.Seizure disorder Stable onKeppra  4. Anemia of chronic disease Stable  5.Chronic tobacco smoking abuse/dependency Cessation counseling and nicotine patch  DISCHARGE CONDITIONS:  Stable  CONSULTS OBTAINED:  Treatment Team:  Thana Farreynolds, Leslie, MD Beverly SessionsSubedi, Jagannath, MD Clapacs, Jackquline DenmarkJohn T, MD  DRUG ALLERGIES:   Allergies  Allergen Reactions  . Hydrocodone-Acetaminophen Nausea Only and Nausea And Vomiting    DISCHARGE MEDICATIONS:   Allergies as of 04/16/2017      Reactions   Hydrocodone-acetaminophen Nausea Only, Nausea And Vomiting      Medication List    STOP taking these medications   azithromycin 250 MG tablet Commonly known as:  ZITHROMAX   cefTRIAXone IVPB Commonly known as:  ROCEPHIN   TUBERSOL 5 UNIT/0.1ML injection Generic drug:  tuberculin     TAKE these medications   albuterol 108 (90 Base) MCG/ACT inhaler Commonly known as:  PROVENTIL HFA;VENTOLIN HFA Inhale 2 puffs into the lungs every 6 (six) hours as needed for wheezing or shortness of breath.   arformoterol 15 MCG/2ML Nebu Commonly known as:  BROVANA Take 2 mLs (15 mcg total) by nebulization 2 (two) times daily.   clonazePAM 0.5 MG tablet Commonly known as:  KLONOPIN Take 1 tablet (0.5 mg total) by mouth 2 (two) times daily as needed for anxiety.   cyanocobalamin 2000 MCG tablet Take 1 tablet (2,000 mcg total) by mouth daily.   feeding supplement (ENSURE ENLIVE) Liqd Take 237 mLs by mouth 2 (two) times daily between meals.   ferrous sulfate 325 (65 FE) MG tablet Take 1 tablet (325 mg total) by mouth 2 (two) times daily with a meal.   folic acid 1 MG tablet Commonly known as:  FOLVITE Take 1 tablet (1 mg total) by mouth daily. Start taking on:  04/17/2017   furosemide 20  MG tablet Commonly known as:  LASIX Take 1 tablet (20 mg total) by mouth daily. Start taking on:  04/17/2017   ipratropium-albuterol 0.5-2.5 (3) MG/3ML  Soln Commonly known as:  DUONEB Take 3 mLs by nebulization every 6 (six) hours as needed (FOR PNEUMONIA SYMPTOMS).   levETIRAcetam 750 MG tablet Commonly known as:  KEPPRA Take 1 tablet (750 mg total) by mouth 2 (two) times daily.   magnesium oxide 400 (241.3 Mg) MG tablet Commonly known as:  MAG-OX Take 1 tablet (400 mg total) by mouth daily. Start taking on:  04/17/2017   metoprolol tartrate 25 MG tablet Commonly known as:  LOPRESSOR Take 0.5 tablets (12.5 mg total) by mouth 2 (two) times daily.   mometasone-formoterol 200-5 MCG/ACT Aero Commonly known as:  DULERA Inhale 2 puffs into the lungs 2 (two) times daily.   multivitamin with minerals Tabs tablet Take 1 tablet by mouth daily.   nicotine 21 mg/24hr patch Commonly known as:  NICODERM CQ - dosed in mg/24 hours Place 1 patch (21 mg total) onto the skin daily.   pantoprazole 40 MG tablet Commonly known as:  PROTONIX Take 1 tablet (40 mg total) by mouth daily.   potassium chloride 20 MEQ packet Commonly known as:  KLOR-CON Take 10 mEq by mouth daily. Start taking on:  04/17/2017   predniSONE 20 MG tablet Commonly known as:  DELTASONE Take 2 tablets (40 mg total) by mouth daily with breakfast.   tamsulosin 0.4 MG Caps capsule Commonly known as:  FLOMAX Take 1 capsule (0.4 mg total) by mouth daily.   thiamine 100 MG tablet Take 1 tablet (100 mg total) by mouth daily.        DISCHARGE INSTRUCTIONS:  If you experience worsening of your admission symptoms, develop shortness of breath, life threatening emergency, suicidal or homicidal thoughts you must seek medical attention immediately by calling 911 or calling your MD immediately  if symptoms less severe.  You Must read complete instructions/literature along with all the possible adverse reactions/side effects for all the Medicines you take and that have been prescribed to you. Take any new Medicines after you have completely understood and accept all the possible  adverse reactions/side effects.   Please note  You were cared for by a hospitalist during your hospital stay. If you have any questions about your discharge medications or the care you received while you were in the hospital after you are discharged, you can call the unit and asked to speak with the hospitalist on call if the hospitalist that took care of you is not available. Once you are discharged, your primary care physician will handle any further medical issues. Please note that NO REFILLS for any discharge medications will be authorized once you are discharged, as it is imperative that you return to your primary care physician (or establish a relationship with a primary care physician if you do not have one) for your aftercare needs so that they can reassess your need for medications and monitor your lab values.    Today   CHIEF COMPLAINT:   Chief Complaint  Patient presents with  . Respiratory Distress    HISTORY OF PRESENT ILLNESS:  62 y.o. male with a known history of smoking, hypertension, history of seizure disorder, COPD recently admitted with hypoxic respiratory failure remained on BiPAP chest x-rays suggestive of ARDS/pneumonia was [facility] five days ago comes back with shortness of breath and lethargic. Patient currently is on BiPAP sets 100% he is  more awake and able to talk. Patient not able to give much history. He is being admitted for further evaluation and management of acute on chronic hypoxic respiratory failure secondary to COPD exacerbation. Chest x-ray does not show new pneumonia. ER Physician covered patient with broad-spectrum antibiotics.  VITAL SIGNS:  Blood pressure (!) 146/82, pulse 81, temperature 97.6 F (36.4 C), temperature source Oral, resp. rate 16, height 5\' 6"  (1.676 m), weight 68.5 kg (151 lb), SpO2 98 %.  I/O:    Intake/Output Summary (Last 24 hours) at 04/16/2017 1106 Last data filed at 04/16/2017 0900 Gross per 24  hour  Intake 240 ml  Output 400 ml  Net -160 ml    PHYSICAL EXAMINATION:  GENERAL:  62 y.o.-year-old patient lying in the bed with no acute distress.  EYES: Pupils equal, round, reactive to light and accommodation. No scleral icterus. Extraocular muscles intact.  HEENT: Head atraumatic, normocephalic. Oropharynx and nasopharynx clear.  NECK:  Supple, no jugular venous distention. No thyroid enlargement, no tenderness.  LUNGS: Normal breath sounds bilaterally, no wheezing, rales,rhonchi or crepitation. No use of accessory muscles of respiration.  CARDIOVASCULAR: S1, S2 normal. No murmurs, rubs, or gallops.  ABDOMEN: Soft, non-tender, non-distended. Bowel sounds present. No organomegaly or mass.  EXTREMITIES: No pedal edema, cyanosis, or clubbing.  NEUROLOGIC: Cranial nerves II through XII are intact. Muscle strength 5/5 in all extremities. Sensation intact. Gait not checked.  PSYCHIATRIC: The patient is alert and oriented x 3.  SKIN: No obvious rash, lesion, or ulcer.   DATA REVIEW:   CBC No results for input(s): WBC, HGB, HCT, PLT in the last 168 hours.  Chemistries  Recent Labs  Lab 04/14/17 0338 04/16/17 0542  NA 138 140  K 3.4* 4.1  CL 95* 105  CO2 37* 32  GLUCOSE 134* 108*  BUN 23* 29*  CREATININE 0.86 0.69  CALCIUM 9.1 8.8*  MG 1.7  --     Cardiac Enzymes No results for input(s): TROPONINI in the last 168 hours.  Microbiology Results  Results for orders placed or performed during the hospital encounter of 04/06/17  Blood Culture (routine x 2)     Status: None   Collection Time: 04/06/17 10:45 AM  Result Value Ref Range Status   Specimen Description BLOOD RT WRIST  Final   Special Requests   Final    BOTTLES DRAWN AEROBIC AND ANAEROBIC Blood Culture adequate volume   Culture   Final    NO GROWTH 5 DAYS Performed at [facility], [address] [place], [place] [address]    Report Status 04/11/2017 FINAL  Final  Blood Culture (routine x 2)      Status: None   Collection Time: 04/06/17 10:45 AM  Result Value Ref Range Status   Specimen Description BLOOD LT HAND  Final   Special Requests   Final    BOTTLES DRAWN AEROBIC AND ANAEROBIC Blood Culture adequate volume   Culture   Final    NO GROWTH 5 DAYS Performed at [facility], [address] [place], [place] [address]    Report Status 04/11/2017 FINAL  Final  MRSA PCR Screening     Status: None   Collection Time: 04/06/17  8:18 PM  Result Value Ref Range Status   MRSA by PCR NEGATIVE NEGATIVE Final    Comment:        The GeneXpert MRSA Assay (FDA approved for NASAL specimens only), is one component of a comprehensive MRSA colonization surveillance program. It is  not intended to diagnose MRSA infection nor to guide or monitor treatment for MRSA infections. Performed at [facility], [address] [place], [place] [address]     RADIOLOGY:  Dg Chest 2 View  Result Date: 04/14/2017 CLINICAL DATA:  Hypoxic respiratory failure, on BiPAP, history hypertension, seizure disorder, COPD EXAM: CHEST - 2 VIEW COMPARISON:  04/12/2017 FINDINGS: Normal heart size mediastinal contours. Interval improvement in pulmonary infiltrates. No pleural effusion or pneumothorax. Bones unremarkable. IMPRESSION: Improving pulmonary infiltrates. Electronically Signed   By: Ulyses Southward M.D.   On: 04/14/2017 18:49    EKG:   Orders placed or performed during the hospital encounter of 04/06/17  . ED EKG 12-Lead  . ED EKG 12-Lead  . EKG 12-Lead  . EKG 12-Lead  . EKG 12-Lead  . EKG 12-Lead      Management plans discussed with the patient, family and they are in agreement.  CODE STATUS:     Code Status Orders  (From admission, onward)        Start     Ordered   04/06/17 1657  Full code  Continuous     04/06/17 1656    Code Status History    Date Active Date Inactive Code Status Order ID Comments User Context   03/26/2017 0629 04/01/2017 2047 Full Code [id number]   Arnaldo Natal, MD Inpatient   03/21/2017 1813 03/24/2017 2058 Full Code [id number]  Adrian Saran, MD Inpatient   03/13/2017 1004 03/14/2017 1346 Full Code [id number]  Enid Baas, MD Inpatient   03/12/2017 1606 03/13/2017 1004 DNR [id number]  Ihor Austin, MD Inpatient   03/04/2017 1923 03/04/2017 2355 DNR [id number]  Alford Highland, MD ED      TOTAL TIME TAKING CARE OF THIS PATIENT: 45 minutes.    Evelena Asa Jhordan Kinter M.D on 04/16/2017 at 11:06 AM  Between 7am to 6pm - Pager - [phone]  After 6pm go to [email or web address] - password [email or web address]  [facility]  Office  [phone]  CC: Primary care physician; Jerl Mina, MD   Note: This dictation was prepared with Dragon dictation along with smaller phrase technology. Any transcriptional errors that result from this process are unintentional.

## 2017-04-16 NOTE — Progress Notes (Signed)
Report called to [facility], [phone]. [facility]. No questions with conclusion of report.  Requested non-emergent transport requested 1:28 pm

## 2017-04-16 NOTE — Clinical Social Work Note (Signed)
Physician discharged patient today to [facility]. CSW notified Tresa EndoKelly at [facility] and sent discharge information. Patient to transfer via EMS.  York SpanielMonica Marlee Trentman MSW,LCSW [phone]

## 2017-04-24 ENCOUNTER — Telehealth: Payer: Self-pay | Admitting: Internal Medicine

## 2017-04-24 NOTE — Telephone Encounter (Signed)
Spoke with wife, patient is in rehab. She states she will  Call back to schedule appointment

## 2017-04-24 NOTE — Telephone Encounter (Signed)
-----   Message from [place]isty R Ahmad, [place] sent at 8/46/96294/15/2019  2:54 PM EDT ----- Regarding: FW: HFU Please schedule pt for a hosp f/u in a 30 minute slot in one week w/cxr no more than 2 days [facility]  Thanks, Misty   ----- Message ----- From: Shane Crutchamachandran, Pradeep, MD Sent: 04/15/2017   2:34 PM To: [facility] Clinical Pool Subject: HFU                                            Pt needs hfu with me next week with CXR before hand for pneumonia.

## 2017-05-01 ENCOUNTER — Ambulatory Visit: Payer: Medicare Other | Admitting: Urology

## 2017-06-28 ENCOUNTER — Encounter (INDEPENDENT_AMBULATORY_CARE_PROVIDER_SITE_OTHER): Payer: Medicare Other | Admitting: Vascular Surgery

## 2017-08-20 ENCOUNTER — Encounter (INDEPENDENT_AMBULATORY_CARE_PROVIDER_SITE_OTHER): Payer: Medicare Other | Admitting: Vascular Surgery

## 2017-08-21 ENCOUNTER — Other Ambulatory Visit: Payer: Self-pay

## 2017-08-21 ENCOUNTER — Inpatient Hospital Stay
Admission: EM | Admit: 2017-08-21 | Discharge: 2017-08-22 | DRG: 872 | Disposition: A | Discharge status: Home / Self Care | Payer: Medicare Other | Attending: Internal Medicine | Admitting: Internal Medicine

## 2017-08-21 ENCOUNTER — Emergency Department: Payer: Medicare Other

## 2017-08-21 DIAGNOSIS — Z8042 Family history of malignant neoplasm of prostate: Secondary | ICD-10-CM | POA: Diagnosis not present

## 2017-08-21 DIAGNOSIS — Z833 Family history of diabetes mellitus: Secondary | ICD-10-CM | POA: Diagnosis not present

## 2017-08-21 DIAGNOSIS — Z803 Family history of malignant neoplasm of breast: Secondary | ICD-10-CM

## 2017-08-21 DIAGNOSIS — F419 Anxiety disorder, unspecified: Secondary | ICD-10-CM | POA: Diagnosis not present

## 2017-08-21 DIAGNOSIS — E876 Hypokalemia: Secondary | ICD-10-CM | POA: Diagnosis present

## 2017-08-21 DIAGNOSIS — R0603 Acute respiratory distress: Secondary | ICD-10-CM | POA: Diagnosis present

## 2017-08-21 DIAGNOSIS — Z7952 Long term (current) use of systemic steroids: Secondary | ICD-10-CM

## 2017-08-21 DIAGNOSIS — Z7951 Long term (current) use of inhaled steroids: Secondary | ICD-10-CM | POA: Diagnosis not present

## 2017-08-21 DIAGNOSIS — A419 Sepsis, unspecified organism: Principal | ICD-10-CM | POA: Diagnosis present

## 2017-08-21 DIAGNOSIS — F17210 Nicotine dependence, cigarettes, uncomplicated: Secondary | ICD-10-CM | POA: Diagnosis not present

## 2017-08-21 DIAGNOSIS — R569 Unspecified convulsions: Secondary | ICD-10-CM

## 2017-08-21 DIAGNOSIS — Z885 Allergy status to narcotic agent status: Secondary | ICD-10-CM | POA: Diagnosis not present

## 2017-08-21 DIAGNOSIS — Z79899 Other long term (current) drug therapy: Secondary | ICD-10-CM | POA: Diagnosis not present

## 2017-08-21 DIAGNOSIS — R0602 Shortness of breath: Secondary | ICD-10-CM | POA: Diagnosis present

## 2017-08-21 DIAGNOSIS — R7989 Other specified abnormal findings of blood chemistry: Secondary | ICD-10-CM | POA: Diagnosis present

## 2017-08-21 DIAGNOSIS — F32A Depression, unspecified: Secondary | ICD-10-CM | POA: Diagnosis present

## 2017-08-21 DIAGNOSIS — F329 Major depressive disorder, single episode, unspecified: Secondary | ICD-10-CM | POA: Diagnosis present

## 2017-08-21 DIAGNOSIS — Z8051 Family history of malignant neoplasm of kidney: Secondary | ICD-10-CM | POA: Diagnosis not present

## 2017-08-21 LAB — CBC
HEMATOCRIT: 32.2 % — AB (ref 40.0–52.0)
HEMOGLOBIN: 11.3 g/dL — AB (ref 13.0–18.0)
MCH: 30.8 pg (ref 26.0–34.0)
MCHC: 35.1 g/dL (ref 32.0–36.0)
MCV: 87.9 fL (ref 80.0–100.0)
Platelets: 308 10*3/uL (ref 150–440)
RBC: 3.67 MIL/uL — ABNORMAL LOW (ref 4.40–5.90)
RDW: 14.6 % — ABNORMAL HIGH (ref 11.5–14.5)
WBC: 8.1 10*3/uL (ref 3.8–10.6)

## 2017-08-21 LAB — URINALYSIS, COMPLETE (UACMP) WITH MICROSCOPIC
BACTERIA UA: NONE SEEN
Bilirubin Urine: NEGATIVE
Glucose, UA: NEGATIVE mg/dL
Hgb urine dipstick: NEGATIVE
KETONES UR: NEGATIVE mg/dL
Leukocytes, UA: NEGATIVE
Nitrite: NEGATIVE
PH: 7 (ref 5.0–8.0)
Protein, ur: NEGATIVE mg/dL
SPECIFIC GRAVITY, URINE: 1.001 — AB (ref 1.005–1.030)
Squamous Epithelial / LPF: NONE SEEN (ref 0–5)

## 2017-08-21 LAB — BASIC METABOLIC PANEL
ANION GAP: 11 (ref 5–15)
BUN: 6 mg/dL — ABNORMAL LOW (ref 8–23)
CO2: 22 mmol/L (ref 22–32)
Calcium: 9.3 mg/dL (ref 8.9–10.3)
Chloride: 104 mmol/L (ref 98–111)
Creatinine, Ser: 0.84 mg/dL (ref 0.61–1.24)
GFR calc Af Amer: >60 mL/min (ref >60)
GFR calc non Af Amer: >60 mL/min (ref >60)
GLUCOSE: 115 mg/dL — AB (ref 70–99)
POTASSIUM: 2.7 mmol/L — AB (ref 3.5–5.1)
Sodium: 137 mmol/L (ref 135–145)

## 2017-08-21 LAB — BLOOD GAS, VENOUS
Acid-base deficit: 0 mmol/L (ref 0.0–2.0)
BICARBONATE: 20.1 mmol/L (ref 20.0–28.0)
O2 SAT: 88.4 %
PATIENT TEMPERATURE: 37
PO2 VEN: 45 mmHg (ref 32.0–45.0)
pCO2, Ven: 21 mmHg — ABNORMAL LOW (ref 44.0–60.0)
pH, Ven: 7.59 — ABNORMAL HIGH (ref 7.250–7.430)

## 2017-08-21 LAB — LACTIC ACID, PLASMA: Lactic Acid, Venous: 4.7 mmol/L (ref 0.5–1.9)

## 2017-08-21 LAB — TROPONIN I: Troponin I: <0.03 ng/mL (ref <0.03)

## 2017-08-21 MED ORDER — SODIUM CHLORIDE 0.9 % IV BOLUS
1000.0000 mL | Freq: Once | INTRAVENOUS | Status: AC
  Administered 2017-08-21: 1000 mL via INTRAVENOUS

## 2017-08-21 MED ORDER — LEVETIRACETAM 750 MG PO TABS
750.0000 mg | ORAL_TABLET | Freq: Once | ORAL | Status: AC
  Administered 2017-08-21: 750 mg via ORAL
  Filled 2017-08-21: qty 1

## 2017-08-21 MED ORDER — POTASSIUM CHLORIDE 10 MEQ/100ML IV SOLN
10.0000 meq | Freq: Once | INTRAVENOUS | Status: AC
  Administered 2017-08-21: 10 meq via INTRAVENOUS
  Filled 2017-08-21: qty 100

## 2017-08-21 MED ORDER — VANCOMYCIN HCL IN DEXTROSE 1-5 GM/200ML-% IV SOLN
1000.0000 mg | Freq: Once | INTRAVENOUS | Status: AC
  Administered 2017-08-21: 1000 mg via INTRAVENOUS
  Filled 2017-08-21: qty 200

## 2017-08-21 MED ORDER — FLUOXETINE HCL 20 MG PO CAPS
20.0000 mg | ORAL_CAPSULE | Freq: Once | ORAL | Status: AC
  Administered 2017-08-21: 20 mg via ORAL
  Filled 2017-08-21: qty 1

## 2017-08-21 MED ORDER — TAMSULOSIN HCL 0.4 MG PO CAPS
0.4000 mg | ORAL_CAPSULE | Freq: Once | ORAL | Status: AC
  Administered 2017-08-21: 0.4 mg via ORAL
  Filled 2017-08-21: qty 1

## 2017-08-21 MED ORDER — SODIUM CHLORIDE 0.9 % IV SOLN
2.0000 g | Freq: Once | INTRAVENOUS | Status: AC
  Administered 2017-08-21: 2 g via INTRAVENOUS
  Filled 2017-08-21: qty 2

## 2017-08-21 MED ORDER — IPRATROPIUM-ALBUTEROL 0.5-2.5 (3) MG/3ML IN SOLN
3.0000 mL | Freq: Once | RESPIRATORY_TRACT | Status: AC
  Administered 2017-08-21: 3 mL via RESPIRATORY_TRACT

## 2017-08-21 MED ORDER — METHYLPREDNISOLONE SODIUM SUCC 125 MG IJ SOLR
125.0000 mg | Freq: Once | INTRAMUSCULAR | Status: AC
  Administered 2017-08-21: 125 mg via INTRAVENOUS

## 2017-08-21 NOTE — ED Triage Notes (Signed)
Pt arrived via [facility] from home with c/o confusion and shortness of breath. EMS states pt has been increasingly confused in the last couple of days and has had a nonproductive cough. Pt presents to the ED short of breath with respirations in the 30s. Pt given by EMS 125 solumedrol and 1 albuterol and duoneb treatment.

## 2017-08-21 NOTE — H&P (Signed)
[facility] - [place] at [facility]   PATIENT NAME: Marco [name]    MR#:  [id number]  DATE OF BIRTH:  [birth year]  DATE OF ADMISSION:  08/21/2017  PRIMARY CARE PHYSICIAN: Jerl Mina, MD   REQUESTING/REFERRING PHYSICIAN: Marisa Severin, MD  CHIEF COMPLAINT:   Chief Complaint  Patient presents with  . Shortness of Breath    HISTORY OF PRESENT ILLNESS:  Marco [name]  is a 62 y.o. male who presents with chief complaint as above.  Patient states he has had some shortness of breath over the past day or 2.  He came into the ED tonight for evaluation and was found to have an elevated lactic acid.  His work-up otherwise was largely benign except for hypokalemia.  Imaging did not show clear pneumonia, but given his symptoms and his elevated lactic acid he was given antibiotics and hospitalist were called for admission and further evaluation  PAST MEDICAL HISTORY:   Past Medical History:  Diagnosis Date  . Anxiety   . Arthritis   . Depression   . Heartburn   . Seizures (HCC)      PAST SURGICAL HISTORY:   Past Surgical History:  Procedure Laterality Date  . KNEE ARTHROSCOPY  1975     SOCIAL HISTORY:   Social History   Tobacco Use  . Smoking status: Current Every Day Smoker    Packs/day: 1.00  . Smokeless tobacco: Never Used  Substance Use Topics  . Alcohol use: Yes    Alcohol/week: 4.0 standard drinks    Types: 4 Cans of beer per week     FAMILY HISTORY:   Family History  Problem Relation Age of Onset  . Diabetes Mellitus II Mother   . Breast cancer Mother   . Kidney disease Neg Hx   . Prostate cancer Neg Hx      DRUG ALLERGIES:   Allergies  Allergen Reactions  . Hydrocodone-Acetaminophen Nausea Only and Nausea And Vomiting    MEDICATIONS AT HOME:   Prior to Admission medications   Medication Sig Start Date End Date Taking? Authorizing Provider  albuterol (PROVENTIL HFA;VENTOLIN HFA) 108 (90 Base) MCG/ACT inhaler Inhale 2  puffs into the lungs every 6 (six) hours as needed for wheezing or shortness of breath. 03/24/17  Yes Katha Hamming, MD  FLUoxetine (PROZAC) 20 MG capsule Take 20 mg by mouth daily.  07/24/17  Yes [provider]  levETIRAcetam (KEPPRA) 750 MG tablet Take 1 tablet (750 mg total) by mouth 2 (two) times daily. 03/24/17  Yes Katha Hamming, MD  tamsulosin (FLOMAX) 0.4 MG CAPS capsule Take 1 capsule (0.4 mg total) by mouth daily. Patient taking differently: Take 0.4 mg by mouth 2 (two) times daily.  03/25/17  Yes Katha Hamming, MD  arformoterol (BROVANA) 15 MCG/2ML NEBU Take 2 mLs (15 mcg total) by nebulization 2 (two) times daily. Patient not taking: Reported on 08/21/2017 04/16/17   Salary, Evelena Asa, MD  clonazePAM (KLONOPIN) 0.5 MG tablet Take 1 tablet (0.5 mg total) by mouth 2 (two) times daily as needed for anxiety. Patient not taking: Reported on 08/21/2017 04/16/17 04/16/18  Salary, Evelena Asa, MD  cyanocobalamin 2000 MCG tablet Take 1 tablet (2,000 mcg total) by mouth daily. Patient not taking: Reported on 08/21/2017 03/25/17   Katha Hamming, MD  feeding supplement, ENSURE ENLIVE, (ENSURE ENLIVE) LIQD Take 237 mLs by mouth 2 (two) times daily between meals. 03/24/17   Katha Hamming, MD  ferrous sulfate 325 (65 FE) MG tablet Take 1 tablet (  325 mg total) by mouth 2 (two) times daily with a meal. Patient not taking: Reported on 08/21/2017 04/16/17   Salary, Evelena AsaMontell D, MD  folic acid (FOLVITE) 1 MG tablet Take 1 tablet (1 mg total) by mouth daily. Patient not taking: Reported on 08/21/2017 04/17/17   Salary, Jetty DuhamelMontell D, MD  furosemide (LASIX) 20 MG tablet Take 1 tablet (20 mg total) by mouth daily. Patient not taking: Reported on 08/21/2017 04/17/17   Salary, Jetty DuhamelMontell D, MD  ipratropium-albuterol (DUONEB) 0.5-2.5 (3) MG/3ML SOLN Take 3 mLs by nebulization every 6 (six) hours as needed (FOR PNEUMONIA SYMPTOMS).    [provider]  magnesium oxide (MAG-OX) 400 (241.3  Mg) MG tablet Take 1 tablet (400 mg total) by mouth daily. Patient not taking: Reported on 08/21/2017 04/17/17   Salary, Jetty DuhamelMontell D, MD  metoprolol tartrate (LOPRESSOR) 25 MG tablet Take 0.5 tablets (12.5 mg total) by mouth 2 (two) times daily. Patient not taking: Reported on 08/21/2017 04/01/17   Shaune Pollackhen, Qing, MD  mometasone-formoterol [facility](DULERA) 200-5 MCG/ACT AERO Inhale 2 puffs into the lungs 2 (two) times daily. Patient not taking: Reported on 08/21/2017 03/14/17   Enid BaasKalisetti, Radhika, MD  Multiple Vitamin (MULTIVITAMIN WITH MINERALS) TABS tablet Take 1 tablet by mouth daily. Patient not taking: Reported on 08/21/2017 04/01/17   Shaune Pollackhen, Qing, MD  nicotine (NICODERM CQ - DOSED IN MG/24 HOURS) 21 mg/24hr patch Place 1 patch (21 mg total) onto the skin daily. Patient not taking: Reported on 08/21/2017 03/25/17   Adrian SaranMody, Sital, MD  pantoprazole (PROTONIX) 40 MG tablet Take 1 tablet (40 mg total) by mouth daily. Patient not taking: Reported on 08/21/2017 03/15/17   Enid BaasKalisetti, Radhika, MD  potassium chloride (KLOR-CON) 20 MEQ packet Take 10 mEq by mouth daily. Patient not taking: Reported on 08/21/2017 04/17/17   Salary, Evelena AsaMontell D, MD  predniSONE (DELTASONE) 20 MG tablet Take 2 tablets (40 mg total) by mouth daily with breakfast. Patient not taking: Reported on 08/21/2017 04/16/17 04/16/18  Salary, Jetty DuhamelMontell D, MD  thiamine 100 MG tablet Take 1 tablet (100 mg total) by mouth daily. Patient not taking: Reported on 08/21/2017 04/01/17   Shaune Pollackhen, Qing, MD    REVIEW OF SYSTEMS:  Review of Systems  Constitutional: Negative for chills, fever, malaise/fatigue and weight loss.  HENT: Negative for ear pain, hearing loss and tinnitus.   Eyes: Negative for blurred vision, double vision, pain and redness.  Respiratory: Positive for shortness of breath. Negative for cough and hemoptysis.   Cardiovascular: Negative for chest pain, palpitations, orthopnea and leg swelling.  Gastrointestinal: Negative for abdominal pain, constipation,  diarrhea, nausea and vomiting.  Genitourinary: Negative for dysuria, frequency and hematuria.  Musculoskeletal: Negative for back pain, joint pain and neck pain.  Skin:       No acne, rash, or lesions  Neurological: Negative for dizziness, tremors, focal weakness and weakness.  Endo/Heme/Allergies: Negative for polydipsia. Does not bruise/bleed easily.  Psychiatric/Behavioral: Negative for depression. The patient is not nervous/anxious and does not have insomnia.      VITAL SIGNS:   Vitals:   08/21/17 2005 08/21/17 2006 08/21/17 2230  BP: (!) 142/77  (!) 123/92  Pulse: 95  (!) 101  Resp: 20  13  Temp: 98.4 F (36.9 C)    TempSrc: Oral    SpO2: 100%  99%  Weight:  72.6 kg   Height:  5' 5.5" (1.664 m)    Wt Readings from Last 3 Encounters:  08/21/17 72.6 kg  04/06/17 68.5 kg  04/01/17 68.8  kg    PHYSICAL EXAMINATION:  Physical Exam  Vitals reviewed. Constitutional: He is oriented to person, place, and time. He appears well-developed and well-nourished. No distress.  HENT:  Head: Normocephalic and atraumatic.  Mouth/Throat: Oropharynx is clear and moist.  Eyes: Pupils are equal, round, and reactive to light. Conjunctivae and EOM are normal. No scleral icterus.  Neck: Normal range of motion. Neck supple. No JVD present. No thyromegaly present.  Cardiovascular: Normal rate, regular rhythm and intact distal pulses. Exam reveals no gallop and no friction rub.  No murmur heard. Respiratory: Effort normal and breath sounds normal. No respiratory distress. He has no wheezes. He has no rales.  GI: Soft. Bowel sounds are normal. He exhibits no distension. There is no tenderness.  Musculoskeletal: Normal range of motion. He exhibits no edema.  No arthritis, no gout  Lymphadenopathy:    He has no cervical adenopathy.  Neurological: He is alert and oriented to person, place, and time. No cranial nerve deficit.  No dysarthria, no aphasia  Skin: Skin is warm and dry. No rash noted. No  erythema.  Psychiatric: He has a normal mood and affect. His behavior is normal. Judgment and thought content normal.    LABORATORY PANEL:   CBC Recent Labs  Lab 08/21/17 2008  WBC 8.1  HGB 11.3*  HCT 32.2*  PLT 308   ------------------------------------------------------------------------------------------------------------------  Chemistries  Recent Labs  Lab 08/21/17 2008  NA 137  K 2.7*  CL 104  CO2 22  GLUCOSE 115*  BUN 6*  CREATININE 0.84  CALCIUM 9.3   ------------------------------------------------------------------------------------------------------------------  Cardiac Enzymes Recent Labs  Lab 08/21/17 2008  TROPONINI <0.03   ------------------------------------------------------------------------------------------------------------------  RADIOLOGY:  Dg Chest 2 View  Result Date: 08/21/2017 CLINICAL DATA:  Short of breath and cough EXAM: CHEST - 2 VIEW COMPARISON:  04/14/2017, 04/12/2017, 04/10/2017 04/09/2017 FINDINGS: No pleural effusion. No focal airspace disease or effusion. Normal cardiomediastinal silhouette. No pneumothorax. IMPRESSION: No active cardiopulmonary disease. Electronically Signed   By: Jasmine Pang M.D.   On: 08/21/2017 20:32    EKG:   Orders placed or performed during the hospital encounter of 08/21/17  . EKG 12-Lead  . EKG 12-Lead  . ED EKG  . ED EKG    IMPRESSION AND PLAN:  Principal Problem:   SOB (shortness of breath) -unclear etiology, lungs clear on exam, imaging does not show pneumonia, but the patient had significant shortness of breath and he does have an elevated lactic acid.  IV antibiotics given in the ED, will monitor him closely tonight as his breathing seems somewhat better on this writer's exam. Active Problems:   Elevated lactic acid level -IV fluids tonight, trend lactic acid until within normal limits   Hypokalemia -replace and monitor   Seizures (HCC) -home dose antiepileptics   Anxiety -home dose  anxiolytic   Depression -home dose antidepressant   Chart review performed and case discussed with ED provider. Labs, imaging and/or ECG reviewed by provider and discussed with patient/family. Management plans discussed with the patient and/or family.  DVT PROPHYLAXIS: SubQ lovenox   GI PROPHYLAXIS:  None  ADMISSION STATUS: Inpatient     CODE STATUS: Full Code Status History    Date Active Date Inactive Code Status Order ID Comments User Context   04/06/2017 1656 04/16/2017 1710 Full Code [id number]  Enedina Finner, MD Inpatient   03/26/2017 0629 04/01/2017 2047 Full Code [id number]  Arnaldo Natal, MD Inpatient   03/21/2017 1813 03/24/2017 2058 Full Code [id number]  Adrian Saran,  MD Inpatient   03/13/2017 1004 03/14/2017 1346 Full Code [id number]  Enid BaasKalisetti, Radhika, MD Inpatient   03/12/2017 1606 03/13/2017 1004 DNR [id number]  Ihor AustinPyreddy, Pavan, MD Inpatient   03/04/2017 1923 03/04/2017 2355 DNR [id number]  Alford HighlandWieting, Richard, MD ED      TOTAL TIME TAKING CARE OF THIS PATIENT: 45 minutes.   Vikrant Pryce FIELDING 08/21/2017, 11:55 PM  [facility]  Office  [phone]  CC: Primary care physician; Jerl MinaHedrick, James, MD  Note:  This document was prepared using Dragon voice recognition software and may include unintentional dictation errors.

## 2017-08-21 NOTE — ED Provider Notes (Signed)
[facility] Emergency Department Provider Note ____________________________________________   First MD Initiated Contact with Patient 08/21/17 2016     (approximate)  I have reviewed the triage vital signs and the nursing notes.   HISTORY  Chief Complaint Shortness of Breath    HPI Marco [name] is a 62 y.o. male with PMH as noted below who presents with shortness of breath, acute onset approximately an hour prior to arrival, associated with some increased confusion and anxiety.  The patient reports nonproductive cough as well.  He denies chest pain, fever, or vomiting.  Past Medical History:  Diagnosis Date  . Anxiety   . Arthritis   . Depression   . Heartburn   . Seizures [facility](HCC)     Patient Active Problem List   Diagnosis Date Noted  . Acute delirium   . Acute on chronic respiratory failure with hypoxia (HCC) 04/06/2017  . Pressure injury of skin 04/06/2017  . HCAP (healthcare-associated pneumonia) 03/26/2017  . Malnutrition of moderate degree 03/22/2017  . Sepsis (HCC) 03/21/2017  . Hyponatremia 03/12/2017  . Acute encephalopathy 03/04/2017    Past Surgical History:  Procedure Laterality Date  . KNEE ARTHROSCOPY  1975    Prior to Admission medications   Medication Sig Start Date End Date Taking? Authorizing Provider  albuterol (PROVENTIL HFA;VENTOLIN HFA) 108 (90 Base) MCG/ACT inhaler Inhale 2 puffs into the lungs every 6 (six) hours as needed for wheezing or shortness of breath. 03/24/17  Yes Katha HammingKonidena, Snehalatha, MD  FLUoxetine (PROZAC) 20 MG capsule Take 20 mg by mouth daily.  07/24/17  Yes [provider]  levETIRAcetam (KEPPRA) 750 MG tablet Take 1 tablet (750 mg total) by mouth 2 (two) times daily. 03/24/17  Yes Katha HammingKonidena, Snehalatha, MD  tamsulosin (FLOMAX) 0.4 MG CAPS capsule Take 1 capsule (0.4 mg total) by mouth daily. Patient taking differently: Take 0.4 mg by mouth 2 (two) times daily.  03/25/17  Yes Katha HammingKonidena, Snehalatha,  MD  arformoterol (BROVANA) 15 MCG/2ML NEBU Take 2 mLs (15 mcg total) by nebulization 2 (two) times daily. Patient not taking: Reported on 08/21/2017 04/16/17   Salary, Evelena AsaMontell D, MD  clonazePAM (KLONOPIN) 0.5 MG tablet Take 1 tablet (0.5 mg total) by mouth 2 (two) times daily as needed for anxiety. Patient not taking: Reported on 08/21/2017 04/16/17 04/16/18  Salary, Evelena AsaMontell D, MD  cyanocobalamin 2000 MCG tablet Take 1 tablet (2,000 mcg total) by mouth daily. Patient not taking: Reported on 08/21/2017 03/25/17   Katha HammingKonidena, Snehalatha, MD  feeding supplement, ENSURE ENLIVE, (ENSURE ENLIVE) LIQD Take 237 mLs by mouth 2 (two) times daily between meals. 03/24/17   Katha HammingKonidena, Snehalatha, MD  ferrous sulfate 325 (65 FE) MG tablet Take 1 tablet (325 mg total) by mouth 2 (two) times daily with a meal. Patient not taking: Reported on 08/21/2017 04/16/17   Salary, Evelena AsaMontell D, MD  folic acid (FOLVITE) 1 MG tablet Take 1 tablet (1 mg total) by mouth daily. Patient not taking: Reported on 08/21/2017 04/17/17   Salary, Jetty DuhamelMontell D, MD  furosemide (LASIX) 20 MG tablet Take 1 tablet (20 mg total) by mouth daily. Patient not taking: Reported on 08/21/2017 04/17/17   Salary, Jetty DuhamelMontell D, MD  ipratropium-albuterol (DUONEB) 0.5-2.5 (3) MG/3ML SOLN Take 3 mLs by nebulization every 6 (six) hours as needed (FOR PNEUMONIA SYMPTOMS).    [provider]  magnesium oxide (MAG-OX) 400 (241.3 Mg) MG tablet Take 1 tablet (400 mg total) by mouth daily. Patient not taking: Reported on 08/21/2017 04/17/17  Salary, Evelena Asa, MD  metoprolol tartrate (LOPRESSOR) 25 MG tablet Take 0.5 tablets (12.5 mg total) by mouth 2 (two) times daily. Patient not taking: Reported on 08/21/2017 04/01/17   Shaune Pollack, MD  mometasone-formoterol [facility]) 200-5 MCG/ACT AERO Inhale 2 puffs into the lungs 2 (two) times daily. Patient not taking: Reported on 08/21/2017 03/14/17   Enid Baas, MD  Multiple Vitamin (MULTIVITAMIN WITH MINERALS) TABS tablet Take 1  tablet by mouth daily. Patient not taking: Reported on 08/21/2017 04/01/17   Shaune Pollack, MD  nicotine (NICODERM CQ - DOSED IN MG/24 HOURS) 21 mg/24hr patch Place 1 patch (21 mg total) onto the skin daily. Patient not taking: Reported on 08/21/2017 03/25/17   Adrian Saran, MD  pantoprazole (PROTONIX) 40 MG tablet Take 1 tablet (40 mg total) by mouth daily. Patient not taking: Reported on 08/21/2017 03/15/17   Enid Baas, MD  potassium chloride (KLOR-CON) 20 MEQ packet Take 10 mEq by mouth daily. Patient not taking: Reported on 08/21/2017 04/17/17   Salary, Evelena Asa, MD  predniSONE (DELTASONE) 20 MG tablet Take 2 tablets (40 mg total) by mouth daily with breakfast. Patient not taking: Reported on 08/21/2017 04/16/17 04/16/18  Salary, Jetty Duhamel D, MD  thiamine 100 MG tablet Take 1 tablet (100 mg total) by mouth daily. Patient not taking: Reported on 08/21/2017 04/01/17   Shaune Pollack, MD    Allergies Hydrocodone-acetaminophen  Family History  Problem Relation Age of Onset  . Diabetes Mellitus II Mother   . Breast cancer Mother   . Kidney disease Neg Hx   . Prostate cancer Neg Hx     Social History Social History   Tobacco Use  . Smoking status: Current Every Day Smoker    Packs/day: 1.00  . Smokeless tobacco: Never Used  Substance Use Topics  . Alcohol use: Yes    Alcohol/week: 4.0 standard drinks    Types: 4 Cans of beer per week  . Drug use: No    Review of Systems  Constitutional: No fever. Eyes: No redness. ENT: No sore throat. Cardiovascular: Denies chest pain. Respiratory: Positive for shortness of breath. Gastrointestinal: No vomiting.  Genitourinary: Negative for dysuria.  Musculoskeletal: Negative for back pain. Skin: Negative for rash. Neurological: Negative for headache.   ____________________________________________   PHYSICAL EXAM:  VITAL SIGNS: ED Triage Vitals  Enc Vitals Group     BP 08/21/17 2005 (!) 142/77     Pulse Rate 08/21/17 2005 95     Resp  08/21/17 2005 20     Temp 08/21/17 2005 98.4 F (36.9 C)     Temp Source 08/21/17 2005 Oral     SpO2 08/21/17 2005 100 %     Weight 08/21/17 2006 160 lb (72.6 kg)     Height 08/21/17 2006 5' 5.5" (1.664 m)     Head Circumference --      Peak Flow --      Pain Score 08/21/17 2016 0     Pain Loc --      Pain Edu? --      Excl. in GC? --     Constitutional: Alert and oriented.  Anxious appearing and tachypneic. Eyes: Conjunctivae are normal.  Head: Atraumatic. Nose: No congestion/rhinnorhea. Mouth/Throat: Mucous membranes are somewhat dry.   Neck: Normal range of motion.  Cardiovascular: Normal rate, regular rhythm. Grossly normal heart sounds.  Good peripheral circulation. Respiratory: Increased respiratory effort.  No retractions.  Somewhat coarse breath sounds bilaterally. Gastrointestinal: Soft and nontender. No distention.  Genitourinary: No  flank tenderness. Musculoskeletal: No lower extremity edema.  Extremities warm and well perfused.  Neurologic:  Normal speech and language. No gross focal neurologic deficits are appreciated.  Skin:  Skin is warm and dry. No rash noted. Psychiatric: Mood and affect are normal. Speech and behavior are normal.  ____________________________________________   LABS (all labs ordered are listed, but only abnormal results are displayed)  Labs Reviewed  BASIC METABOLIC PANEL - Abnormal; Notable for the following components:      Result Value   Potassium 2.7 (*)    Glucose, Bld 115 (*)    BUN 6 (*)    All other components within normal limits  CBC - Abnormal; Notable for the following components:   RBC 3.67 (*)    Hemoglobin 11.3 (*)    HCT 32.2 (*)    RDW 14.6 (*)    All other components within normal limits  LACTIC ACID, PLASMA - Abnormal; Notable for the following components:   Lactic Acid, Venous 4.7 (*)    All other components within normal limits  BLOOD GAS, VENOUS - Abnormal; Notable for the following components:   pH, Ven 7.59  (*)    pCO2, Ven 21 (*)    All other components within normal limits  URINALYSIS, COMPLETE (UACMP) WITH MICROSCOPIC - Abnormal; Notable for the following components:   Color, Urine COLORLESS (*)    APPearance CLEAR (*)    Specific Gravity, Urine 1.001 (*)    All other components within normal limits  CULTURE, BLOOD (ROUTINE X 2)  CULTURE, BLOOD (ROUTINE X 2)  TROPONIN I  LACTIC ACID, PLASMA   ____________________________________________  EKG  ED ECG REPORT I, Dionne Bucy, the attending physician, personally viewed and interpreted this ECG.  Date: 08/21/2017 EKG Time: 2005 Rate: 97 Rhythm: normal sinus rhythm QRS Axis: normal Intervals: normal ST/T Wave abnormalities: normal Narrative Interpretation: no evidence of acute ischemia  ____________________________________________  RADIOLOGY  CXR: No focal infiltrate  ____________________________________________   PROCEDURES  Procedure(s) performed: No  Procedures  Critical Care performed: No ____________________________________________   INITIAL IMPRESSION / ASSESSMENT AND PLAN / ED COURSE  Pertinent labs & imaging results that were available during my care of the patient were reviewed by me and considered in my medical decision making (see chart for details).  61 year old male with PMH as noted above presents with acute onset of shortness of breath over the last hour, with some nonproductive cough and apparent confusion over the last several days.  On arrival, the patient appeared anxious, and was tachypneic although O2 saturation was 100%.  The patient was given Solu-Medrol by EMS and placed on nebs in the ED.  Differential includes acute bronchitis, COPD, pneumonia, less likely ACS or acute CHF.  I do not suspect PE given the lack of chest pain, hypoxia, or tachycardia, as well as the lack of leg swelling or any symptoms to suggest DVT.  Disposition will be based on patient's clinical status and the  results of the work-up.  ----------------------------------------- 10:37 PM on 08/21/2017 -----------------------------------------  VBG shows low CO2, consistent with hyperventilation.  The patient's UA and chest x-ray are negative, however his lactate is significantly elevated and given his extensive prior history of pneumonia and sepsis, and I am concerned that this corresponds to early pneumonia/sepsis.  The patient is also hypokalemic.  We will give empiric antibiotics and fluids per sepsis protocol, replete potassium, and admit.  I signed the patient out to the hospitalist Dr. Anne Hahn.   ____________________________________________   FINAL  CLINICAL IMPRESSION(S) / ED DIAGNOSES  Final diagnoses:  Respiratory distress  Sepsis, due to unspecified organism (HCC)  Hypokalemia      NEW MEDICATIONS STARTED DURING THIS VISIT:  New Prescriptions   No medications on file     Note:  This document was prepared using Dragon voice recognition software and may include unintentional dictation errors.    Dionne BucySiadecki, Zavon Hyson, MD 08/21/17 2238

## 2017-08-22 ENCOUNTER — Other Ambulatory Visit: Payer: Self-pay

## 2017-08-22 DIAGNOSIS — E876 Hypokalemia: Secondary | ICD-10-CM | POA: Diagnosis not present

## 2017-08-22 DIAGNOSIS — A419 Sepsis, unspecified organism: Secondary | ICD-10-CM | POA: Diagnosis not present

## 2017-08-22 LAB — CBC
HEMATOCRIT: 30.3 % — AB (ref 40.0–52.0)
HEMOGLOBIN: 10.7 g/dL — AB (ref 13.0–18.0)
MCH: 31.2 pg (ref 26.0–34.0)
MCHC: 35.2 g/dL (ref 32.0–36.0)
MCV: 88.6 fL (ref 80.0–100.0)
Platelets: 303 10*3/uL (ref 150–440)
RBC: 3.43 MIL/uL — AB (ref 4.40–5.90)
RDW: 14.4 % (ref 11.5–14.5)
WBC: 4.2 10*3/uL (ref 3.8–10.6)

## 2017-08-22 LAB — BASIC METABOLIC PANEL
ANION GAP: 9 (ref 5–15)
BUN: 6 mg/dL — ABNORMAL LOW (ref 8–23)
CALCIUM: 8.8 mg/dL — AB (ref 8.9–10.3)
CHLORIDE: 110 mmol/L (ref 98–111)
CO2: 21 mmol/L — AB (ref 22–32)
Creatinine, Ser: 0.88 mg/dL (ref 0.61–1.24)
GFR calc non Af Amer: >60 mL/min (ref >60)
GLUCOSE: 177 mg/dL — AB (ref 70–99)
Potassium: 3.6 mmol/L (ref 3.5–5.1)
Sodium: 140 mmol/L (ref 135–145)

## 2017-08-22 LAB — MRSA PCR SCREENING: MRSA BY PCR: NEGATIVE

## 2017-08-22 LAB — LACTIC ACID, PLASMA: Lactic Acid, Venous: 3.3 mmol/L (ref 0.5–1.9)

## 2017-08-22 LAB — PROCALCITONIN: Procalcitonin: <0.1 ng/mL

## 2017-08-22 MED ORDER — LEVETIRACETAM 750 MG PO TABS
750.0000 mg | ORAL_TABLET | Freq: Two times a day (BID) | ORAL | Status: DC
  Administered 2017-08-22: 750 mg via ORAL
  Filled 2017-08-22 (×3): qty 1

## 2017-08-22 MED ORDER — IPRATROPIUM-ALBUTEROL 0.5-2.5 (3) MG/3ML IN SOLN: 3.0000 mL | Freq: Four times a day (QID) | RESPIRATORY_TRACT | Status: DC | PRN

## 2017-08-22 MED ORDER — SODIUM CHLORIDE 0.9 % IV SOLN
INTRAVENOUS | Status: AC
  Administered 2017-08-22: 06:00:00 via INTRAVENOUS

## 2017-08-22 MED ORDER — ONDANSETRON HCL 4 MG/2ML IJ SOLN: 4.0000 mg | Freq: Four times a day (QID) | INTRAMUSCULAR | Status: DC | PRN

## 2017-08-22 MED ORDER — VANCOMYCIN HCL IN DEXTROSE 1-5 GM/200ML-% IV SOLN
1000.0000 mg | Freq: Two times a day (BID) | INTRAVENOUS | Status: DC
  Administered 2017-08-22: 1000 mg via INTRAVENOUS
  Filled 2017-08-22 (×3): qty 200

## 2017-08-22 MED ORDER — TRAZODONE HCL 50 MG PO TABS: 25.0000 mg | ORAL_TABLET | Freq: Once | ORAL | Status: DC

## 2017-08-22 MED ORDER — ACETAMINOPHEN 650 MG RE SUPP: 650.0000 mg | Freq: Four times a day (QID) | RECTAL | Status: DC | PRN

## 2017-08-22 MED ORDER — ACETAMINOPHEN 325 MG PO TABS: 650.0000 mg | ORAL_TABLET | Freq: Four times a day (QID) | ORAL | Status: DC | PRN

## 2017-08-22 MED ORDER — HEPARIN SODIUM (PORCINE) 5000 UNIT/ML IJ SOLN
5000.0000 [IU] | Freq: Three times a day (TID) | INTRAMUSCULAR | Status: DC
  Administered 2017-08-22: 06:00:00 5000 [IU] via SUBCUTANEOUS
  Filled 2017-08-22: qty 1

## 2017-08-22 MED ORDER — POTASSIUM CHLORIDE CRYS ER 20 MEQ PO TBCR
40.0000 meq | EXTENDED_RELEASE_TABLET | Freq: Once | ORAL | Status: DC
  Filled 2017-08-22: qty 2

## 2017-08-22 MED ORDER — FLUOXETINE HCL 20 MG PO CAPS
20.0000 mg | ORAL_CAPSULE | Freq: Every day | ORAL | Status: DC
  Administered 2017-08-22: 20 mg via ORAL
  Filled 2017-08-22 (×2): qty 1

## 2017-08-22 MED ORDER — SODIUM CHLORIDE 0.9 % IV SOLN
2.0000 g | Freq: Three times a day (TID) | INTRAVENOUS | Status: DC
  Administered 2017-08-22: 2 g via INTRAVENOUS
  Filled 2017-08-22 (×6): qty 2

## 2017-08-22 MED ORDER — ONDANSETRON HCL 4 MG PO TABS: 4.0000 mg | ORAL_TABLET | Freq: Four times a day (QID) | ORAL | Status: DC | PRN

## 2017-08-22 NOTE — Progress Notes (Signed)
MD order received to discharge pt home today; verbally reviewed AVS with pt and pt's spouse; no questions voiced at this time; pt's discharge pending arrival of a wheelchair by nursing

## 2017-08-22 NOTE — Progress Notes (Signed)
Pt discharged via wheelchair by nursing to the visitor's entrance 

## 2017-08-22 NOTE — Progress Notes (Signed)
Pharmacy Antibiotic Note  Marco [name] is a 62 y.o. male admitted on 08/21/2017 with sepsis.  Pharmacy has been consulted for vancomycin and cefepime dosing.  Plan: DW 73kg  Vd 51L kei 0.0072 hr-1  T1/2 10 hours Vancomycin 1 gram q 12 hours ordered with stacked dosing. Level before 5th dose. Goal trough 15-20  Cefepime 2 grams q 8 hours ordered  Height: 5' 5.5" (166.4 cm) Weight: 160 lb (72.6 kg) IBW/kg (Calculated) : 62.65  Temp (24hrs), Avg:98.4 F (36.9 C), Min:98.4 F (36.9 C), Max:98.4 F (36.9 C)  Recent Labs  Lab 08/21/17 2008 08/21/17 2120  WBC 8.1  --   CREATININE 0.84  --   LATICACIDVEN  --  4.7*    Estimated Creatinine Clearance: 81.9 mL/min (by C-G formula based on SCr of 0.84 mg/dL).    Allergies  Allergen Reactions  . Hydrocodone-Acetaminophen Nausea Only and Nausea And Vomiting    Antimicrobials this admission: Vancomycin, cefepime 8/21  >>    >>   Dose adjustments this admission:   Microbiology results: 8/21 BCx: pending      8/21 CXR: no active disease 8/21 UA: (-) Thank you for allowing pharmacy to be a part of this patient's care.  Kenneth Cuaresma S 08/22/2017 12:48 AM

## 2017-08-26 LAB — CULTURE, BLOOD (ROUTINE X 2)
CULTURE: NO GROWTH
Culture: NO GROWTH
SPECIAL REQUESTS: ADEQUATE

## 2017-08-27 NOTE — Discharge Summary (Signed)
[facility] [place] at [facility]   PATIENT NAME: Marco [name]    MR#:  [id number]  DATE OF BIRTH:  [birth year]  DATE OF ADMISSION:  08/21/2017 ADMITTING PHYSICIAN: Oralia Manisavid Willis, MD  DATE OF DISCHARGE: 08/22/2017  4:40 PM  PRIMARY CARE PHYSICIAN: Jerl MinaHedrick, James, MD    ADMISSION DIAGNOSIS:  Hypokalemia [E87.6] Respiratory distress [R06.03] Sepsis, due to unspecified organism (HCC) [A41.9]  DISCHARGE DIAGNOSIS:  Principal Problem:   SOB (shortness of breath) Active Problems:   Hypokalemia   Elevated lactic acid level   Anxiety   Depression   Seizures (HCC)   SECONDARY DIAGNOSIS:   Past Medical History:  Diagnosis Date  . Anxiety   . Arthritis   . Depression   . Heartburn   . Seizures (HCC)     HOSPITAL COURSE:    SOB (shortness of breath) -unclear etiology, lungs clear on exam, imaging does not show pneumonia, but the patient had significant shortness of breath and he does have an elevated lactic acid.  IV antibiotics given in the ED, will monitor him closely tonight as his breathing seems somewhat better  Improved.   Elevated lactic acid level -IV fluids tonight, trend lactic acid until within normal limits   Hypokalemia -replace and monitor   Seizures (HCC) -home dose antiepileptics   Anxiety -home dose anxiolytic   Depression -home dose antidepressant  DISCHARGE CONDITIONS:   Stable.  CONSULTS OBTAINED:    DRUG ALLERGIES:   Allergies  Allergen Reactions  . Hydrocodone-Acetaminophen Nausea Only and Nausea And Vomiting    DISCHARGE MEDICATIONS:   Allergies as of 08/22/2017      Reactions   Hydrocodone-acetaminophen Nausea Only, Nausea And Vomiting      Medication List    STOP taking these medications   arformoterol 15 MCG/2ML Nebu Commonly known as:  BROVANA   clonazePAM 0.5 MG tablet Commonly known as:  KLONOPIN   folic acid 1 MG tablet Commonly known as:  FOLVITE   furosemide 20 MG tablet Commonly known as:   LASIX   magnesium oxide 400 (241.3 Mg) MG tablet Commonly known as:  MAG-OX   metoprolol tartrate 25 MG tablet Commonly known as:  LOPRESSOR   pantoprazole 40 MG tablet Commonly known as:  PROTONIX   potassium chloride 20 MEQ packet Commonly known as:  KLOR-CON   predniSONE 20 MG tablet Commonly known as:  DELTASONE     TAKE these medications   albuterol 108 (90 Base) MCG/ACT inhaler Commonly known as:  PROVENTIL HFA;VENTOLIN HFA Inhale 2 puffs into the lungs every 6 (six) hours as needed for wheezing or shortness of breath.   cyanocobalamin 2000 MCG tablet Take 1 tablet (2,000 mcg total) by mouth daily.   feeding supplement (ENSURE ENLIVE) Liqd Take 237 mLs by mouth 2 (two) times daily between meals.   ferrous sulfate 325 (65 FE) MG tablet Take 1 tablet (325 mg total) by mouth 2 (two) times daily with a meal.   FLUoxetine 20 MG capsule Commonly known as:  PROZAC Take 20 mg by mouth daily.   ipratropium-albuterol 0.5-2.5 (3) MG/3ML Soln Commonly known as:  DUONEB Take 3 mLs by nebulization every 6 (six) hours as needed (FOR PNEUMONIA SYMPTOMS).   levETIRAcetam 750 MG tablet Commonly known as:  KEPPRA Take 1 tablet (750 mg total) by mouth 2 (two) times daily.   mometasone-formoterol 200-5 MCG/ACT Aero Commonly known as:  DULERA Inhale 2 puffs into the lungs 2 (two) times daily.   multivitamin with minerals  Tabs tablet Take 1 tablet by mouth daily.   nicotine 21 mg/24hr patch Commonly known as:  NICODERM CQ - dosed in mg/24 hours Place 1 patch (21 mg total) onto the skin daily.   tamsulosin 0.4 MG Caps capsule Commonly known as:  FLOMAX Take 1 capsule (0.4 mg total) by mouth daily. What changed:  when to take this   thiamine 100 MG tablet Take 1 tablet (100 mg total) by mouth daily.        DISCHARGE INSTRUCTIONS:    Follow with PMD In 1-2 weeks.  If you experience worsening of your admission symptoms, develop shortness of breath, life threatening  emergency, suicidal or homicidal thoughts you must seek medical attention immediately by calling 911 or calling your MD immediately  if symptoms less severe.  You Must read complete instructions/literature along with all the possible adverse reactions/side effects for all the Medicines you take and that have been prescribed to you. Take any new Medicines after you have completely understood and accept all the possible adverse reactions/side effects.   Please note  You were cared for by a hospitalist during your hospital stay. If you have any questions about your discharge medications or the care you received while you were in the hospital after you are discharged, you can call the unit and asked to speak with the hospitalist on call if the hospitalist that took care of you is not available. Once you are discharged, your primary care physician will handle any further medical issues. Please note that NO REFILLS for any discharge medications will be authorized once you are discharged, as it is imperative that you return to your primary care physician (or establish a relationship with a primary care physician if you do not have one) for your aftercare needs so that they can reassess your need for medications and monitor your lab values.    Today   CHIEF COMPLAINT:   Chief Complaint  Patient presents with  . Shortness of Breath    HISTORY OF PRESENT ILLNESS:  Marco [name]  is a 62 y.o. male with a known history of  Patient states he has had some shortness of breath over the past day or 2.  He came into the ED tonight for evaluation and was found to have an elevated lactic acid.  His work-up otherwise was largely benign except for hypokalemia.  Imaging did not show clear pneumonia, but given his symptoms and his elevated lactic acid he was given antibiotics and hospitalist were called for admission and further evaluation   VITAL SIGNS:  Blood pressure (!) 141/77, pulse (!) 101, temperature 98.4  F (36.9 C), temperature source Oral, resp. rate 20, height 5\' 5"  (1.651 m), weight 65.7 kg, SpO2 100 %.  I/O:  No intake or output data in the 24 hours ending 08/27/17 2051  PHYSICAL EXAMINATION:  GENERAL:  62 y.o.-year-old patient lying in the bed with no acute distress.  EYES: Pupils equal, round, reactive to light and accommodation. No scleral icterus. Extraocular muscles intact.  HEENT: Head atraumatic, normocephalic. Oropharynx and nasopharynx clear.  NECK:  Supple, no jugular venous distention. No thyroid enlargement, no tenderness.  LUNGS: Normal breath sounds bilaterally, no wheezing, rales,rhonchi or crepitation. No use of accessory muscles of respiration.  CARDIOVASCULAR: S1, S2 normal. No murmurs, rubs, or gallops.  ABDOMEN: Soft, non-tender, non-distended. Bowel sounds present. No organomegaly or mass.  EXTREMITIES: No pedal edema, cyanosis, or clubbing.  NEUROLOGIC: Cranial nerves II through XII are intact. Muscle strength 5/5  in all extremities. Sensation intact. Gait not checked.  PSYCHIATRIC: The patient is alert and oriented x 3.  SKIN: No obvious rash, lesion, or ulcer.   DATA REVIEW:   CBC Recent Labs  Lab 08/22/17 0309  WBC 4.2  HGB 10.7*  HCT 30.3*  PLT 303    Chemistries  Recent Labs  Lab 08/22/17 0309  NA 140  K 3.6  CL 110  CO2 21*  GLUCOSE 177*  BUN 6*  CREATININE 0.88  CALCIUM 8.8*    Cardiac Enzymes Recent Labs  Lab 08/21/17 2008  TROPONINI <0.03    Microbiology Results  Results for orders placed or performed during the hospital encounter of 08/21/17  Culture, blood (routine x 2)     Status: None   Collection Time: 08/21/17  8:08 PM  Result Value Ref Range Status   Specimen Description BLOOD RIGHT FORARM  Final   Special Requests   Final    BOTTLES DRAWN AEROBIC AND ANAEROBIC Blood Culture results may not be optimal due to an excessive volume of blood received in culture bottles   Culture   Final    NO GROWTH 5 DAYS Performed  at [facility], [address] [place], [place] [address]    Report Status 08/26/2017 FINAL  Final  Culture, blood (routine x 2)     Status: None   Collection Time: 08/21/17  8:08 PM  Result Value Ref Range Status   Specimen Description BLOOD LEFT WRIST  Final   Special Requests   Final    BOTTLES DRAWN AEROBIC AND ANAEROBIC Blood Culture adequate volume   Culture   Final    NO GROWTH 5 DAYS Performed at [facility], [address] [place], [place] [address]    Report Status 08/26/2017 FINAL  Final  MRSA PCR Screening     Status: None   Collection Time: 08/22/17 10:28 AM  Result Value Ref Range Status   MRSA by PCR NEGATIVE NEGATIVE Final    Comment:        The GeneXpert MRSA Assay (FDA approved for NASAL specimens only), is one component of a comprehensive MRSA colonization surveillance program. It is not intended to diagnose MRSA infection nor to guide or monitor treatment for MRSA infections. Performed at [facility], [address] [place], [place] [address]     RADIOLOGY:  No results found.  EKG:   Orders placed or performed during the hospital encounter of 08/21/17  . EKG 12-Lead  . EKG 12-Lead  . ED EKG  . ED EKG      Management plans discussed with the patient, family and they are in agreement.  CODE STATUS:  Code Status History    Date Active Date Inactive Code Status Order ID Comments User Context   08/22/2017 0121 08/22/2017 1945 Full Code [id number]  Oralia Manis, MD Inpatient   04/06/2017 1656 04/16/2017 1710 Full Code [id number]  Enedina Finner, MD Inpatient   03/26/2017 0629 04/01/2017 2047 Full Code [id number]  Arnaldo Natal, MD Inpatient   03/21/2017 1813 03/24/2017 2058 Full Code [id number]  Adrian Saran, MD Inpatient   03/13/2017 1004 03/14/2017 1346 Full Code [id number]  Enid Baas, MD Inpatient   03/12/2017 1606 03/13/2017 1004 DNR [id number]  Ihor Austin, MD Inpatient   03/04/2017 1923 03/04/2017 2355 DNR  [id number]  Alford Highland, MD ED      TOTAL TIME TAKING CARE OF THIS PATIENT: 35 minutes.    Altamese Dilling M.D on 08/27/2017 at 8:51 PM  Between 7am to 6pm - Pager - [phone]  After 6pm go to [email or web address] - password [email or web address]  [facility]  Office  [phone]  CC: Primary care physician; Jerl Mina, MD   Note: This dictation was prepared with Dragon dictation along with smaller phrase technology. Any transcriptional errors that result from this process are unintentional.

## 2017-11-21 ENCOUNTER — Emergency Department (HOSPITAL_COMMUNITY)
Admission: EM | Admit: 2017-11-21 | Discharge: 2017-11-22 | Disposition: A | Discharge status: Home / Self Care | Payer: Medicare Other | Attending: Emergency Medicine | Admitting: Emergency Medicine

## 2017-11-21 ENCOUNTER — Other Ambulatory Visit: Payer: Self-pay

## 2017-11-21 ENCOUNTER — Emergency Department (HOSPITAL_COMMUNITY): Payer: Medicare Other

## 2017-11-21 ENCOUNTER — Encounter (HOSPITAL_COMMUNITY): Payer: Self-pay | Admitting: *Deleted

## 2017-11-21 DIAGNOSIS — F17210 Nicotine dependence, cigarettes, uncomplicated: Secondary | ICD-10-CM | POA: Diagnosis not present

## 2017-11-21 DIAGNOSIS — E876 Hypokalemia: Secondary | ICD-10-CM | POA: Insufficient documentation

## 2017-11-21 DIAGNOSIS — F419 Anxiety disorder, unspecified: Secondary | ICD-10-CM | POA: Insufficient documentation

## 2017-11-21 DIAGNOSIS — Z79899 Other long term (current) drug therapy: Secondary | ICD-10-CM | POA: Insufficient documentation

## 2017-11-21 DIAGNOSIS — R0602 Shortness of breath: Secondary | ICD-10-CM | POA: Diagnosis present

## 2017-11-21 DIAGNOSIS — R0600 Dyspnea, unspecified: Secondary | ICD-10-CM | POA: Insufficient documentation

## 2017-11-21 LAB — CBC WITH DIFFERENTIAL/PLATELET
Abs Immature Granulocytes: 0.03 10*3/uL (ref 0.00–0.07)
BASOS ABS: 0 10*3/uL (ref 0.0–0.1)
BASOS PCT: 0 %
EOS ABS: 0 10*3/uL (ref 0.0–0.5)
EOS PCT: 0 %
HCT: 35.5 % — ABNORMAL LOW (ref 39.0–52.0)
Hemoglobin: 11.4 g/dL — ABNORMAL LOW (ref 13.0–17.0)
IMMATURE GRANULOCYTES: 0 %
Lymphocytes Relative: 7 %
Lymphs Abs: 0.5 10*3/uL — ABNORMAL LOW (ref 0.7–4.0)
MCH: 29.2 pg (ref 26.0–34.0)
MCHC: 32.1 g/dL (ref 30.0–36.0)
MCV: 91 fL (ref 80.0–100.0)
Monocytes Absolute: 0.1 10*3/uL (ref 0.1–1.0)
Monocytes Relative: 1 %
NEUTROS PCT: 92 %
NRBC: 0 % (ref 0.0–0.2)
Neutro Abs: 6.8 10*3/uL (ref 1.7–7.7)
PLATELETS: 278 10*3/uL (ref 150–400)
RBC: 3.9 MIL/uL — AB (ref 4.22–5.81)
RDW: 13.6 % (ref 11.5–15.5)
WBC: 7.4 10*3/uL (ref 4.0–10.5)

## 2017-11-21 LAB — RAPID URINE DRUG SCREEN, HOSP PERFORMED
AMPHETAMINES: NOT DETECTED
Barbiturates: NOT DETECTED
Benzodiazepines: NOT DETECTED
COCAINE: NOT DETECTED
OPIATES: NOT DETECTED
TETRAHYDROCANNABINOL: NOT DETECTED

## 2017-11-21 LAB — COMPREHENSIVE METABOLIC PANEL
ALBUMIN: 3.8 g/dL (ref 3.5–5.0)
ALK PHOS: 90 U/L (ref 38–126)
ALT: 8 U/L (ref 0–44)
AST: 23 U/L (ref 15–41)
Anion gap: 9 (ref 5–15)
BUN: <5 mg/dL — ABNORMAL LOW (ref 8–23)
CALCIUM: 9.3 mg/dL (ref 8.9–10.3)
CO2: 23 mmol/L (ref 22–32)
Chloride: 106 mmol/L (ref 98–111)
Creatinine, Ser: 0.86 mg/dL (ref 0.61–1.24)
GFR calc non Af Amer: >60 mL/min (ref >60)
GLUCOSE: 125 mg/dL — AB (ref 70–99)
POTASSIUM: 2.2 mmol/L — AB (ref 3.5–5.1)
SODIUM: 138 mmol/L (ref 135–145)
TOTAL PROTEIN: 6.8 g/dL (ref 6.5–8.1)
Total Bilirubin: 0.9 mg/dL (ref 0.3–1.2)

## 2017-11-21 LAB — ACETAMINOPHEN LEVEL: Acetaminophen (Tylenol), Serum: <10 ug/mL — ABNORMAL LOW (ref 10–30)

## 2017-11-21 LAB — SALICYLATE LEVEL

## 2017-11-21 LAB — ETHANOL: Alcohol, Ethyl (B): <10 mg/dL (ref <10)

## 2017-11-21 LAB — D-DIMER, QUANTITATIVE (NOT AT ARMC)

## 2017-11-21 LAB — TROPONIN I

## 2017-11-21 LAB — BRAIN NATRIURETIC PEPTIDE: B Natriuretic Peptide: 42 pg/mL (ref 0.0–100.0)

## 2017-11-21 MED ORDER — POTASSIUM CHLORIDE 10 MEQ/100ML IV SOLN
10.0000 meq | INTRAVENOUS | Status: AC
  Administered 2017-11-21 (×3): 10 meq via INTRAVENOUS
  Filled 2017-11-21 (×3): qty 100

## 2017-11-21 MED ORDER — LORAZEPAM 2 MG/ML IJ SOLN
0.5000 mg | Freq: Once | INTRAMUSCULAR | Status: AC
  Administered 2017-11-21: 0.5 mg via INTRAVENOUS
  Filled 2017-11-21: qty 1

## 2017-11-21 MED ORDER — POTASSIUM CHLORIDE CRYS ER 20 MEQ PO TBCR: 20.0000 meq | EXTENDED_RELEASE_TABLET | Freq: Every day | ORAL | 0 refills | Status: AC

## 2017-11-21 MED ORDER — NICOTINE 21 MG/24HR TD PT24
21.0000 mg | MEDICATED_PATCH | Freq: Once | TRANSDERMAL | Status: DC
  Administered 2017-11-21: 21 mg via TRANSDERMAL
  Filled 2017-11-21: qty 1

## 2017-11-21 NOTE — ED Notes (Signed)
Message left for pt needing transport due to discharge.

## 2017-11-21 NOTE — ED Triage Notes (Addendum)
Pt brought in by [facility] with c/o weakness, SOB, non productive cough that started last night. 3 Albuterol breathing treatments, 1 Atrovent breathing treatment and 125mg  of Solumedrol IV given by EMS. EMS reports pt is more diminished on the left side.   During triage, pt reports he also feels confused. Pt unsure when he last felt normal. Pt reports when he woke up this morning he was in a mobile home with his wife and grandson but he doesn't know "when I got there". Pt reports he thinks he lives there but he is unsure. Dr. Ranae PalmsYelverton at bedside and made aware.

## 2017-11-21 NOTE — ED Notes (Signed)
Called for 3rd time to contact wife for pickup for pt.  No answer

## 2017-11-21 NOTE — ED Notes (Signed)
CRITICAL VALUE ALERT  Critical Value:  Potassium 2.2  Date & Time Notied:  11/21/17, 1707  Provider Notified: Dr. Ranae PalmsYelverton  Orders Received/Actions taken: no new orders at this time

## 2017-11-21 NOTE — ED Provider Notes (Signed)
[facility] EMERGENCY DEPARTMENT Provider Note   CSN: [id number] Arrival date & time: 11/21/17  1345     History   Chief Complaint Chief Complaint  Patient presents with  . Shortness of Breath    HPI Lisabeth RegisterMichael Deckard is a 62 y.o. male.  HPI Patient states he developed shortness of breath and nonproductive cough last night.  Complaining of generalized weakness and difficulty mentating clearly.  Has had 3 breathing treatments in route by EMS with Solu-Medrol.  Admits to being cold but no subjective fevers. Past Medical History:  Diagnosis Date  . Anxiety   . Arthritis   . Depression   . Heartburn   . Seizures [facility](HCC)     Patient Active Problem List   Diagnosis Date Noted  . SOB (shortness of breath) 08/21/2017  . Hypokalemia 08/21/2017  . Elevated lactic acid level 08/21/2017  . Anxiety 08/21/2017  . Depression 08/21/2017  . Seizures (HCC) 08/21/2017  . Acute delirium   . Acute on chronic respiratory failure with hypoxia (HCC) 04/06/2017  . Pressure injury of skin 04/06/2017  . HCAP (healthcare-associated pneumonia) 03/26/2017  . Malnutrition of moderate degree 03/22/2017  . Sepsis (HCC) 03/21/2017  . Hyponatremia 03/12/2017  . Acute encephalopathy 03/04/2017    Past Surgical History:  Procedure Laterality Date  . KNEE ARTHROSCOPY  1975        Home Medications    Prior to Admission medications   Medication Sig Start Date End Date Taking? Authorizing Provider  albuterol (PROVENTIL HFA;VENTOLIN HFA) 108 (90 Base) MCG/ACT inhaler Inhale 2 puffs into the lungs every 6 (six) hours as needed for wheezing or shortness of breath. 03/24/17  Yes Katha HammingKonidena, Snehalatha, MD  FLUoxetine (PROZAC) 20 MG capsule Take 20 mg by mouth daily.  07/24/17  Yes [provider]  levETIRAcetam (KEPPRA) 750 MG tablet Take 1 tablet (750 mg total) by mouth 2 (two) times daily. 03/24/17  Yes Katha HammingKonidena, Snehalatha, MD  tamsulosin (FLOMAX) 0.4 MG CAPS capsule Take 1 capsule (0.4 mg  total) by mouth daily. Patient taking differently: Take 0.4 mg by mouth 2 (two) times daily.  03/25/17  Yes Katha HammingKonidena, Snehalatha, MD  potassium chloride SA (K-DUR,KLOR-CON) 20 MEQ tablet Take 1 tablet (20 mEq total) by mouth daily. 11/21/17   Loren RacerYelverton, Fathima Bartl, MD    Family History Family History  Problem Relation Age of Onset  . Diabetes Mellitus II Mother   . Breast cancer Mother   . Kidney disease Neg Hx   . Prostate cancer Neg Hx     Social History Social History   Tobacco Use  . Smoking status: Current Every Day Smoker    Packs/day: 1.00    Types: Cigarettes  . Smokeless tobacco: Never Used  Substance Use Topics  . Alcohol use: Yes    Comment: per patient/ he's an alcoholic, but doens't drink often anymore  . Drug use: No     Allergies   Hydrocodone-acetaminophen   Review of Systems Review of Systems  Constitutional: Positive for fatigue. Negative for fever.  HENT: Negative for sore throat and trouble swallowing.   Eyes: Negative for visual disturbance.  Respiratory: Positive for cough and shortness of breath. Negative for wheezing.   Cardiovascular: Negative for chest pain, palpitations and leg swelling.  Gastrointestinal: Negative for abdominal pain, constipation, diarrhea, nausea and vomiting.  Musculoskeletal: Negative for back pain.     Physical Exam Updated Vital Signs BP 127/65   Pulse (!) 104   Temp 97.9 F (36.6 C) (Oral)  Resp 10   Ht 5\' 5"  (1.651 m)   Wt 74.8 kg   SpO2 100%   BMI 27.46 kg/m   Physical Exam  Constitutional: He is oriented to person, place, and time. He appears well-developed and well-nourished. No distress.  HENT:  Head: Normocephalic and atraumatic.  Mouth/Throat: Oropharynx is clear and moist. No oropharyngeal exudate.  Eyes: Pupils are equal, round, and reactive to light. EOM are normal.  Neck: Normal range of motion. Neck supple. No JVD present. No tracheal deviation present. No thyromegaly present.  Cardiovascular:  Normal rate and regular rhythm. Exam reveals no gallop and no friction rub.  No murmur heard. Pulmonary/Chest: Breath sounds normal. No stridor. No respiratory distress. He has no wheezes. He has no rales. He exhibits no tenderness.  Tachypnea  Abdominal: Soft. Bowel sounds are normal. There is no tenderness. There is no rebound and no guarding.  Musculoskeletal: Normal range of motion. He exhibits no edema or tenderness.  No lower extremity swelling, asymmetry or tenderness.  Lymphadenopathy:    He has no cervical adenopathy.  Neurological: He is alert and oriented to person, place, and time.  Moving all extremities without focal deficit.  Sensation intact.  Slight tremendous.   Skin: Skin is warm and dry. No rash noted. He is not diaphoretic. No erythema.  Psychiatric: He has a normal mood and affect. His behavior is normal.  Nursing note and vitals reviewed.    ED Treatments / Results  Labs (all labs ordered are listed, but only abnormal results are displayed) Labs Reviewed  CBC WITH DIFFERENTIAL/PLATELET - Abnormal; Notable for the following components:      Result Value   RBC 3.90 (*)    Hemoglobin 11.4 (*)    HCT 35.5 (*)    Lymphs Abs 0.5 (*)    All other components within normal limits  COMPREHENSIVE METABOLIC PANEL - Abnormal; Notable for the following components:   Potassium 2.2 (*)    Glucose, Bld 125 (*)    BUN <5 (*)    All other components within normal limits  ACETAMINOPHEN LEVEL - Abnormal; Notable for the following components:   Acetaminophen (Tylenol), Serum <10 (*)    All other components within normal limits  BRAIN NATRIURETIC PEPTIDE  SALICYLATE LEVEL  RAPID URINE DRUG SCREEN, HOSP PERFORMED  ETHANOL  D-DIMER, QUANTITATIVE (NOT AT [facility])  TROPONIN I  CARBON MONOXIDE, BLOOD (PERFORMED AT REF LAB)    EKG None  Radiology Dg Chest 2 View  Result Date: 11/21/2017 CLINICAL DATA:  62 year old male with weakness, shortness of breath, nonproductive  cough. EXAM: CHEST - 2 VIEW COMPARISON:  Chest radiographs 08/21/2017 and earlier. FINDINGS: Upright AP and lateral views. Lung volumes and mediastinal contours are stable and within normal limits. No pneumothorax, pulmonary edema, pleural effusion or confluent pulmonary opacity. Visualized tracheal air column is within normal limits. No acute osseous abnormality identified. Negative visible bowel gas pattern. IMPRESSION: No acute cardiopulmonary abnormality. Electronically Signed   By: Odessa Fleming M.D.   On: 11/21/2017 14:54    Procedures Procedures (including critical care time)  Medications Ordered in ED Medications  nicotine (NICODERM CQ - dosed in mg/24 hours) patch 21 mg (21 mg Transdermal Patch Applied 11/21/17 1733)  LORazepam (ATIVAN) injection 0.5 mg (0.5 mg Intravenous Given 11/21/17 1411)  potassium chloride 10 mEq in 100 mL IVPB (10 mEq Intravenous New Bag/Given 11/21/17 2016)     Initial Impression / Assessment and Plan / ED Course  I have reviewed the triage vital  signs and the nursing notes.  Pertinent labs & imaging results that were available during my care of the patient were reviewed by me and considered in my medical decision making (see chart for details).    Patient maintain oxygen saturations in the high 90s.  Tachypnea improved with Ativan.  Chest x-ray without acute findings.  D-dimer is normal.  Likely anxiety reaction.  Patient was noted to have significant hypokalemia.  Given IV potassium replacement and will give outpatient oral potassium replacement.  Patient understands need to follow-up with his primary physician early next week to have potassium rechecked.  Return precautions have been given.   Final Clinical Impressions(s) / ED Diagnoses   Final diagnoses:  Hypokalemia  Dyspnea, unspecified type  Anxiety    ED Discharge Orders         Ordered    potassium chloride SA (K-DUR,KLOR-CON) 20 MEQ tablet  Daily     11/21/17 2123           Loren Racer, MD 11/21/17 2127

## 2017-11-21 NOTE — ED Notes (Signed)
Per wife pt with confusion since hospitalized back in April

## 2017-11-21 NOTE — ED Notes (Signed)
ED Provider at bedside. 

## 2017-11-21 NOTE — ED Notes (Signed)
Patient transported to X-ray 

## 2017-11-21 NOTE — ED Notes (Signed)
Called for 4th time for wife with the only number in the chart, no answer.  Pt does not recall anyone else and number, states that they are in his phone which wife has.

## 2017-11-21 NOTE — ED Notes (Signed)
Attempted to call wife x 2 for transporting pt back home, left message for her to call back.

## 2017-11-21 NOTE — ED Notes (Signed)
Pt returned from xray

## 2017-11-21 NOTE — ED Notes (Signed)
EKG handed to Dr. Long 

## 2017-11-22 LAB — CARBON MONOXIDE, BLOOD (PERFORMED AT REF LAB): CARBON MONOXIDE, BLOOD: 5.3 % — AB (ref 0.0–3.6)

## 2017-11-22 NOTE — ED Notes (Signed)
Called patient's wife around 4 am and received voicemail, wife called back and asked had we been trying to get a hold to her, I told her yes since last night, that patient was discharge and needed a ride home, she stated "she didn't have a ride", and it would be after 8 am, before she could picked him up, patient informed of call from wife.

## 2018-05-17 IMAGING — DX DG CHEST 1V
1 series · 1 of 1 positions shown · non-contrast
Comparison: 04/10/2017

CLINICAL DATA: Dyspnea

EXAM:
CHEST  1 VIEW

[chest ap]
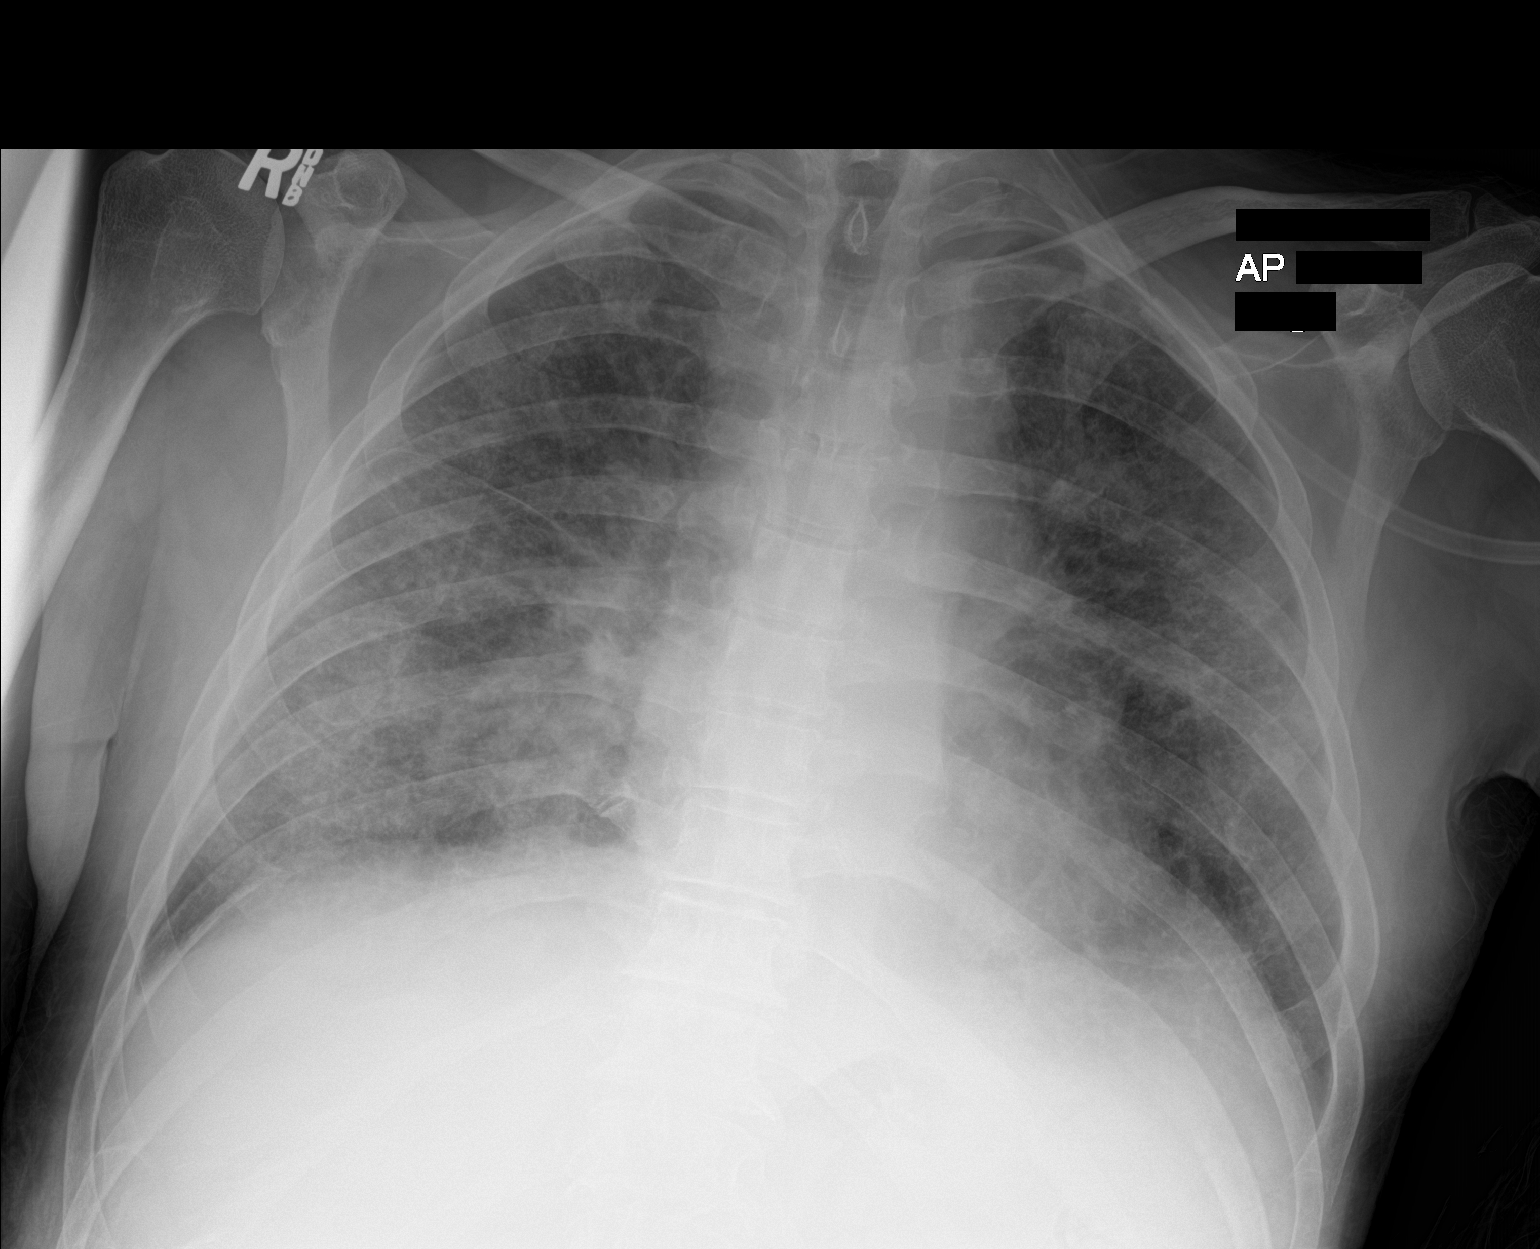

[1 of 1 positions shown; findings below may reference images not displayed]

FINDINGS: Cardiac shadow is stable. Bilateral infiltrates are again identified
and stable in appearance from the prior exam. No sizable effusion is
seen. No bony abnormality is noted.
IMPRESSION: Stable bilateral infiltrates.  No new focal abnormality is seen.

## 2018-05-17 IMAGING — CT CT HEAD W/O CM
3 series · 16 of 47 positions shown, 19 images · non-contrast
Comparison: 03/31/2017

CLINICAL DATA: 61-year-old with acute confusion.

EXAM:
CT HEAD WITHOUT CONTRAST
TECHNIQUE: Contiguous axial images were obtained from the base of the skull
through the vertex without intravenous contrast.

[Series 2: head wo · axial · 0.41mm/px · z∈[+431,+561]mm · 10 of 32 slices shown, 13 images]
[im 3/32  brain]
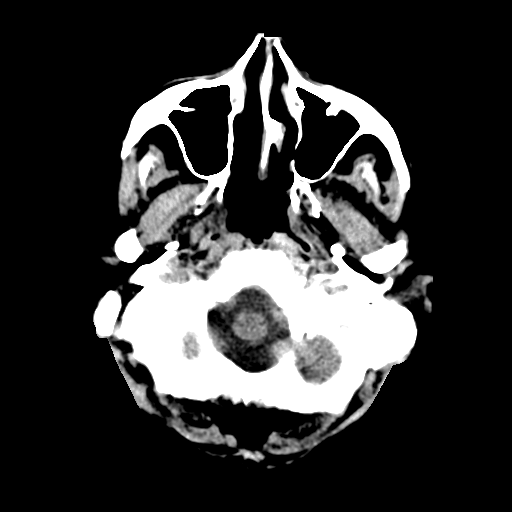
[im 3/32  bone]
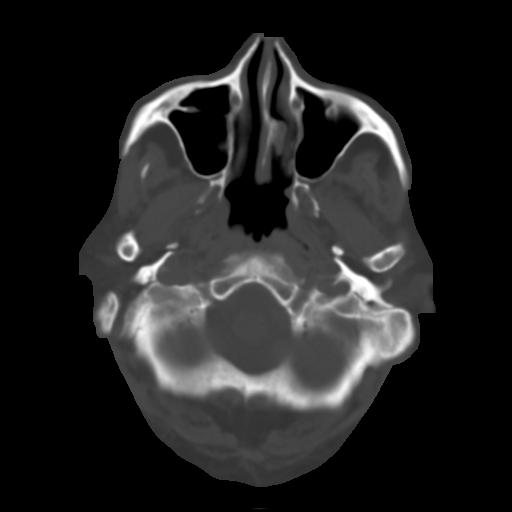
[im 6/32  brain]
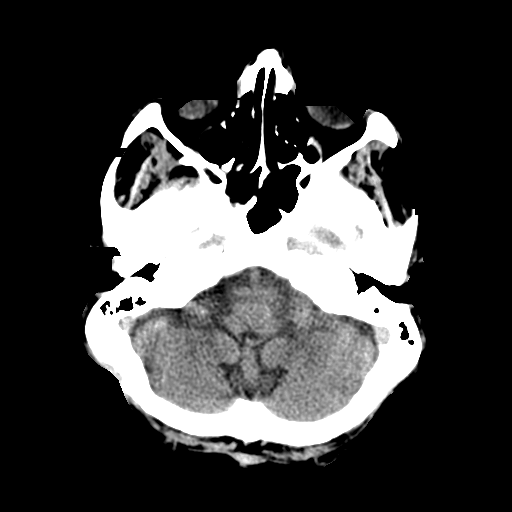
[im 9/32  brain]
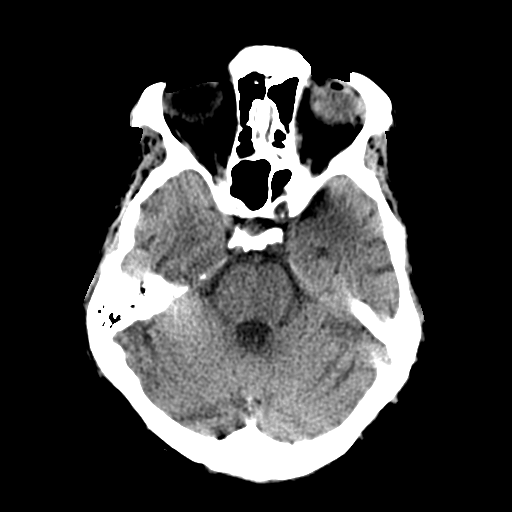
[im 11/32  brain]
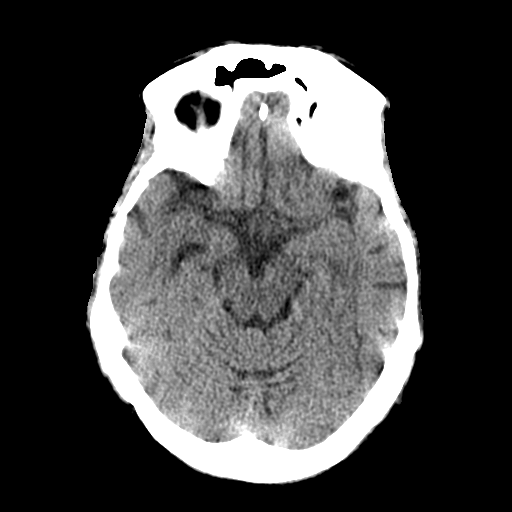
[im 14/32  brain]
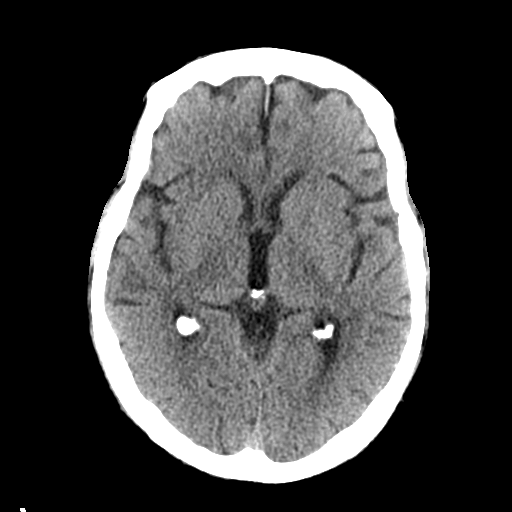
[im 14/32  bone]
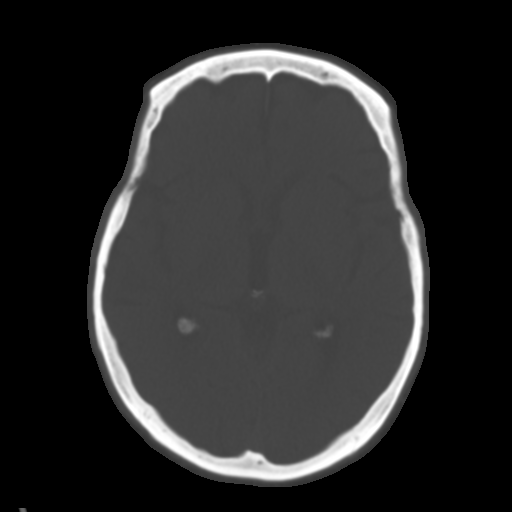
[im 18/32  brain]
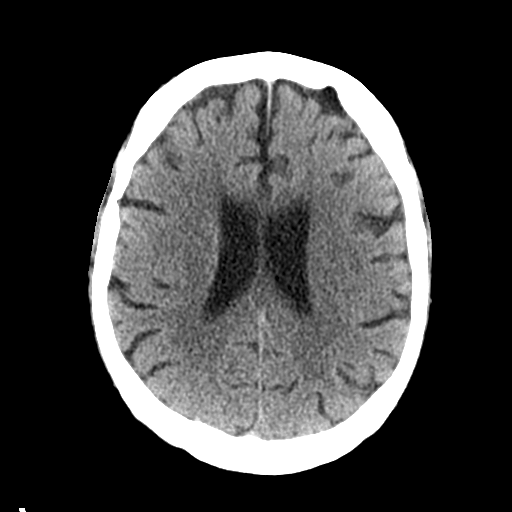
[im 21/32  brain]
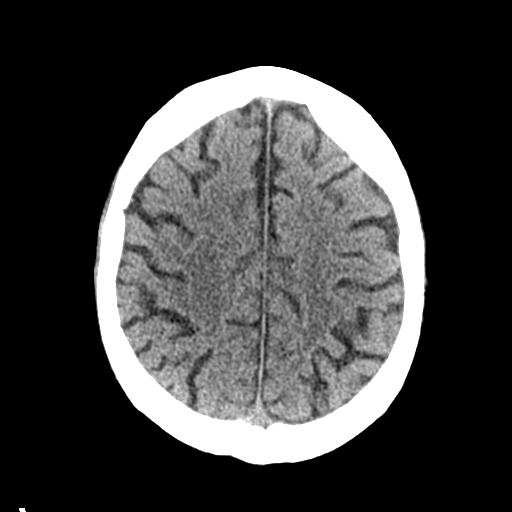
[im 24/32  brain]
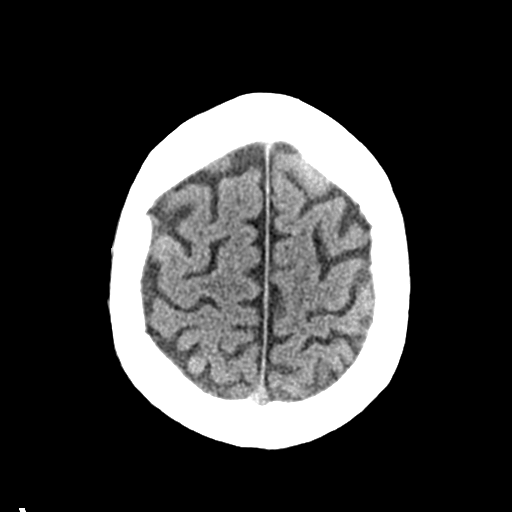
[im 26/32  brain]
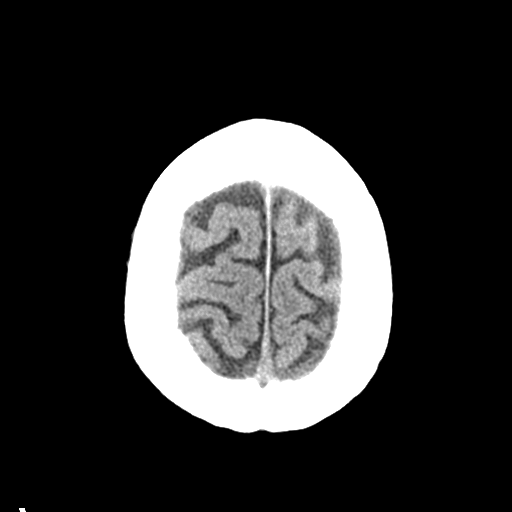
[im 26/32  bone]
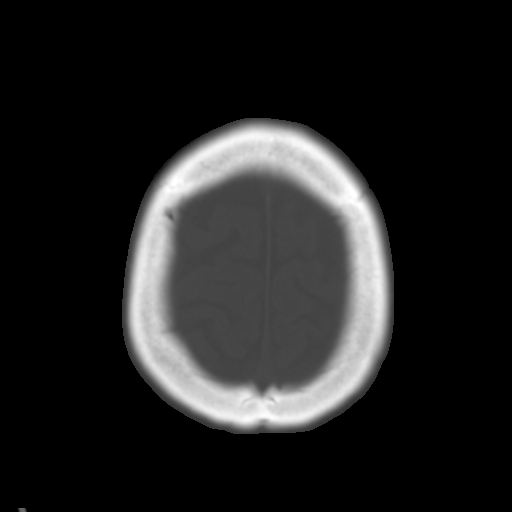
[im 29/32  brain]
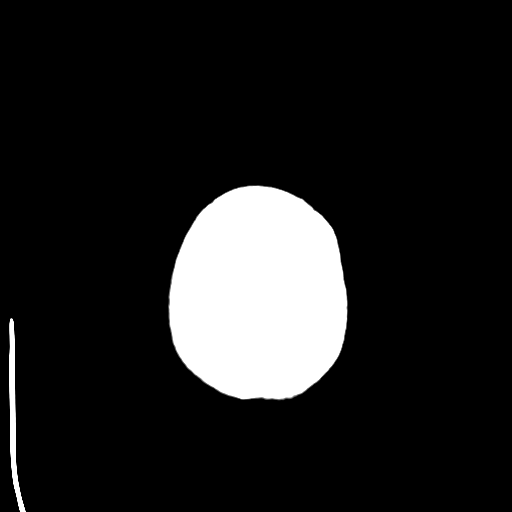

[Series 4: coronal soft tissue · coronal · 0.32mm/px · 3 of 64 slices shown]
[im 22/64  brain]
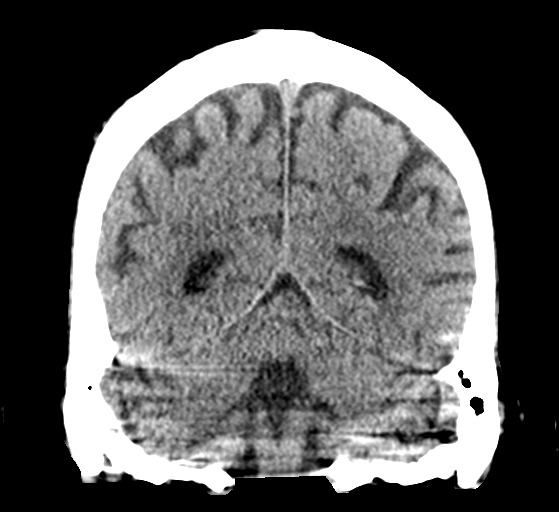
[im 29/64  brain]
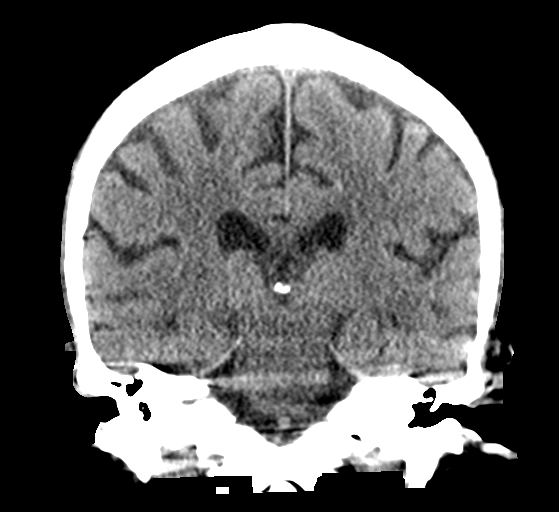
[im 36/64  brain]
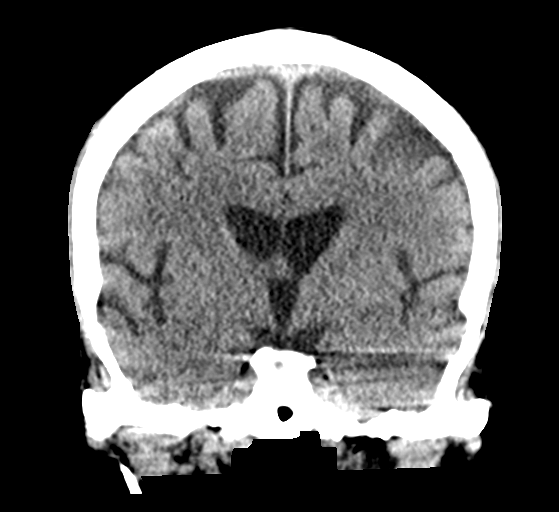

[Series 5: sagittal soft tissue · sagittal · 0.33mm/px · 3 of 51 slices shown]
[im 17/51  brain]
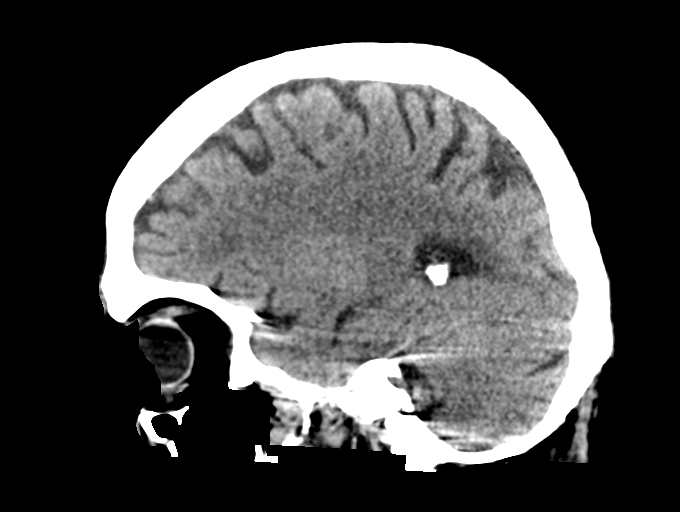
[im 26/51  brain]
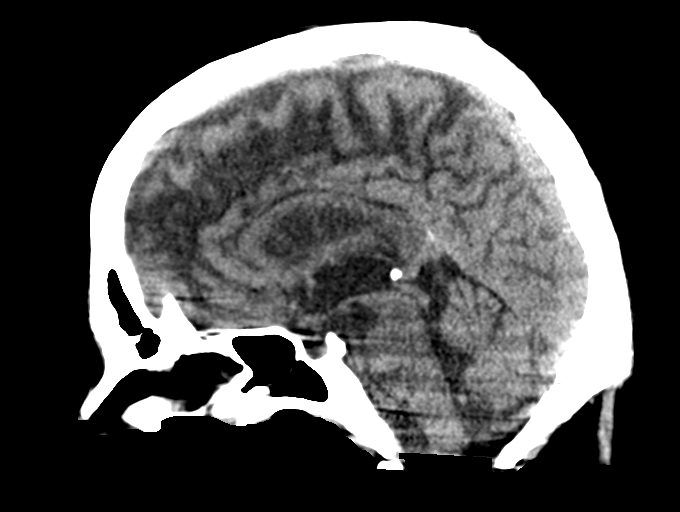
[im 34/51  brain]
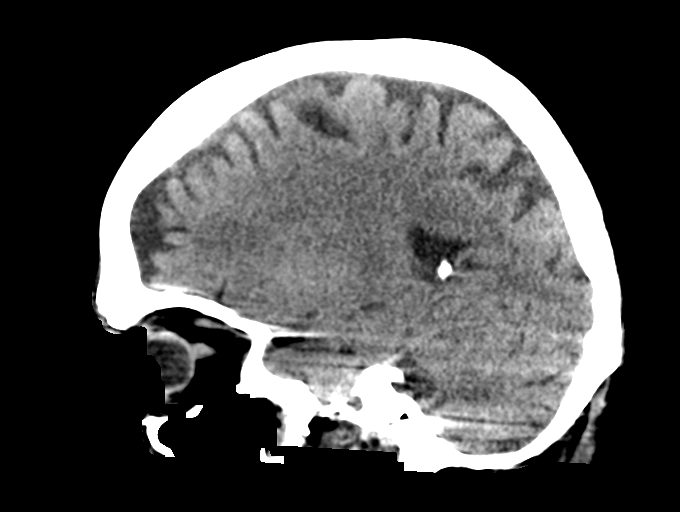

[16 of 47 positions shown; findings below may reference images not displayed]

FINDINGS: Brain: No evidence for acute hemorrhage, mass lesion, midline shift,
hydrocephalus or large infarct.

Vascular: No hyperdense vessel or unexpected calcification.

Skull: Normal. Negative for fracture or focal lesion.

Sinuses/Orbits: There is a polyp or retention cyst in the right
maxillary sinus. Stable partial opacification in the left mastoid
air cells.

Other: None
IMPRESSION: No acute intracranial abnormality.

## 2018-07-23 ENCOUNTER — Telehealth: Payer: Self-pay | Admitting: Orthopedic Surgery

## 2018-07-23 NOTE — Telephone Encounter (Signed)
Patient requests to transfer care from orthopaedist in [place] to [place]; states has history of bad knees.  Although some notes appear in [facility] system/Care everywhere, relayed that patient will need to request the orthopaedic office notes, Xray reports, and Xray films to be reviewed by Dr Aline Brochure prior to scheduling. Voiced understanding.  Will call back once records/films have been received.

## 2018-10-05 ENCOUNTER — Other Ambulatory Visit: Payer: Self-pay

## 2018-10-05 ENCOUNTER — Emergency Department (HOSPITAL_COMMUNITY): Payer: Medicare Other

## 2018-10-05 ENCOUNTER — Emergency Department (HOSPITAL_COMMUNITY)
Admission: EM | Admit: 2018-10-05 | Discharge: 2018-10-06 | Disposition: A | Discharge status: Home / Self Care | Payer: Medicare Other | Attending: Emergency Medicine | Admitting: Emergency Medicine

## 2018-10-05 ENCOUNTER — Encounter (HOSPITAL_COMMUNITY): Payer: Self-pay

## 2018-10-05 DIAGNOSIS — R05 Cough: Secondary | ICD-10-CM | POA: Diagnosis present

## 2018-10-05 DIAGNOSIS — F17210 Nicotine dependence, cigarettes, uncomplicated: Secondary | ICD-10-CM | POA: Insufficient documentation

## 2018-10-05 DIAGNOSIS — Z79899 Other long term (current) drug therapy: Secondary | ICD-10-CM | POA: Insufficient documentation

## 2018-10-05 DIAGNOSIS — Z20828 Contact with and (suspected) exposure to other viral communicable diseases: Secondary | ICD-10-CM | POA: Diagnosis not present

## 2018-10-05 DIAGNOSIS — J441 Chronic obstructive pulmonary disease with (acute) exacerbation: Secondary | ICD-10-CM | POA: Diagnosis not present

## 2018-10-05 HISTORY — DX: Chronic obstructive pulmonary disease, unspecified: J44.9

## 2018-10-05 LAB — BASIC METABOLIC PANEL
Anion gap: 11 (ref 5–15)
BUN: 7 mg/dL — ABNORMAL LOW (ref 8–23)
CO2: 22 mmol/L (ref 22–32)
Calcium: 9.1 mg/dL (ref 8.9–10.3)
Chloride: 100 mmol/L (ref 98–111)
Creatinine, Ser: 1.09 mg/dL (ref 0.61–1.24)
GFR calc Af Amer: >60 mL/min (ref >60)
GFR calc non Af Amer: >60 mL/min (ref >60)
Glucose, Bld: 95 mg/dL (ref 70–99)
Potassium: 3.7 mmol/L (ref 3.5–5.1)
Sodium: 133 mmol/L — ABNORMAL LOW (ref 135–145)

## 2018-10-05 LAB — CBC WITH DIFFERENTIAL/PLATELET
Abs Immature Granulocytes: 0.03 10*3/uL (ref 0.00–0.07)
Basophils Absolute: 0.1 10*3/uL (ref 0.0–0.1)
Basophils Relative: 1 %
Eosinophils Absolute: 0.1 10*3/uL (ref 0.0–0.5)
Eosinophils Relative: 1 %
HCT: 39.4 % (ref 39.0–52.0)
Hemoglobin: 12.7 g/dL — ABNORMAL LOW (ref 13.0–17.0)
Immature Granulocytes: 0 %
Lymphocytes Relative: 29 %
Lymphs Abs: 2.6 10*3/uL (ref 0.7–4.0)
MCH: 30.5 pg (ref 26.0–34.0)
MCHC: 32.2 g/dL (ref 30.0–36.0)
MCV: 94.7 fL (ref 80.0–100.0)
Monocytes Absolute: 0.7 10*3/uL (ref 0.1–1.0)
Monocytes Relative: 8 %
Neutro Abs: 5.4 10*3/uL (ref 1.7–7.7)
Neutrophils Relative %: 61 %
Platelets: 304 10*3/uL (ref 150–400)
RBC: 4.16 MIL/uL — ABNORMAL LOW (ref 4.22–5.81)
RDW: 12.5 % (ref 11.5–15.5)
WBC: 8.9 10*3/uL (ref 4.0–10.5)
nRBC: 0 % (ref 0.0–0.2)

## 2018-10-05 LAB — D-DIMER, QUANTITATIVE (NOT AT ARMC): D-Dimer, Quant: 0.41 ug/mL-FEU (ref 0.00–0.50)

## 2018-10-05 LAB — TROPONIN I (HIGH SENSITIVITY)
Troponin I (High Sensitivity): 4 ng/L (ref <18)
Troponin I (High Sensitivity): 3 ng/L (ref <18)

## 2018-10-05 MED ORDER — ALBUTEROL SULFATE HFA 108 (90 BASE) MCG/ACT IN AERS
4.0000 | INHALATION_SPRAY | Freq: Once | RESPIRATORY_TRACT | Status: AC
  Administered 2018-10-05: 4 via RESPIRATORY_TRACT
  Filled 2018-10-05: qty 6.7

## 2018-10-05 MED ORDER — PREDNISONE 20 MG PO TABS
40.0000 mg | ORAL_TABLET | Freq: Once | ORAL | Status: AC
  Administered 2018-10-05: 23:00:00 40 mg via ORAL
  Filled 2018-10-05: qty 2

## 2018-10-05 MED ORDER — ALBUTEROL SULFATE HFA 108 (90 BASE) MCG/ACT IN AERS
2.0000 | INHALATION_SPRAY | Freq: Once | RESPIRATORY_TRACT | Status: AC
  Administered 2018-10-05: 23:00:00 2 via RESPIRATORY_TRACT
  Filled 2018-10-05: qty 6.7

## 2018-10-05 NOTE — ED Triage Notes (Signed)
Pt c/o pf cough and congestion  that started this morning. Pt believes it is bronchitis since he gets it so frequently. Hx of copd and resp failure.

## 2018-10-05 NOTE — ED Provider Notes (Signed)
[facility] EMERGENCY DEPARTMENT Provider Note   CSN: [id number] Arrival date & time: 10/05/18  1928     History   Chief Complaint Chief Complaint  Patient presents with  . Cough    HPI Marco [name] is a 63 y.o. male.     HPI   Marco [name] is a 63 y.o. male with past medical history of dyspnea, anxiety, and COPD.  He presents to the Emergency Department complaining of increasing cough, nasal congestion, and malaise.  Symptoms began this morning upon waking.  He reports a nonproductive cough that is increasing in severity.  His cough has been associated with some shortness of breath at times, but not persistent.  He states that he develops frequent bronchitis secondary to his COPD and he feels that his current symptoms are similar to previous.  He denies fever, chills, abdominal pain, nausea or vomiting diarrhea.  No peripheral edema. He continues to smoke.  He states that he has been isolating at home and has no known COVID exposures.   Past Medical History:  Diagnosis Date  . Anxiety   . Arthritis   . COPD (chronic obstructive pulmonary disease) (HCC)   . Depression   . Heartburn   . Seizures [facility])     Patient Active Problem List   Diagnosis Date Noted  . SOB (shortness of breath) 08/21/2017  . Hypokalemia 08/21/2017  . Elevated lactic acid level 08/21/2017  . Anxiety 08/21/2017  . Depression 08/21/2017  . Seizures (HCC) 08/21/2017  . Acute delirium   . Acute on chronic respiratory failure with hypoxia (HCC) 04/06/2017  . Pressure injury of skin 04/06/2017  . HCAP (healthcare-associated pneumonia) 03/26/2017  . Malnutrition of moderate degree 03/22/2017  . Sepsis (HCC) 03/21/2017  . Hyponatremia 03/12/2017  . Acute encephalopathy 03/04/2017    Past Surgical History:  Procedure Laterality Date  . KNEE ARTHROSCOPY  1975      Home Medications    Prior to Admission medications   Medication Sig Start Date End Date Taking? Authorizing Provider   albuterol (PROVENTIL HFA;VENTOLIN HFA) 108 (90 Base) MCG/ACT inhaler Inhale 2 puffs into the lungs every 6 (six) hours as needed for wheezing or shortness of breath. 03/24/17  Yes Katha Hamming, MD  FLUoxetine (PROZAC) 20 MG capsule Take 20 mg by mouth daily.  07/24/17  Yes [provider]  levETIRAcetam (KEPPRA) 750 MG tablet Take 1 tablet (750 mg total) by mouth 2 (two) times daily. 03/24/17  Yes Katha Hamming, MD  potassium chloride SA (K-DUR,KLOR-CON) 20 MEQ tablet Take 1 tablet (20 mEq total) by mouth daily. 11/21/17  Yes Loren Racer, MD  tamsulosin (FLOMAX) 0.4 MG CAPS capsule Take 1 capsule (0.4 mg total) by mouth daily. Patient taking differently: Take 0.4 mg by mouth 2 (two) times daily.  03/25/17  Yes Katha Hamming, MD    Family History Family History  Problem Relation Age of Onset  . Diabetes Mellitus II Mother   . Breast cancer Mother   . Kidney disease Neg Hx   . Prostate cancer Neg Hx     Social History Social History   Tobacco Use  . Smoking status: Current Every Day Smoker    Packs/day: 1.00    Types: Cigarettes  . Smokeless tobacco: Never Used  Substance Use Topics  . Alcohol use: Yes    Comment: per patient/ he's an alcoholic, but doens't drink often anymore  . Drug use: No     Allergies   Hydrocodone-acetaminophen   Review of Systems  Review of Systems  Constitutional: Negative for appetite change, chills and fever.  HENT: Positive for congestion. Negative for sore throat and trouble swallowing.   Respiratory: Positive for cough, shortness of breath and wheezing. Negative for chest tightness.   Cardiovascular: Negative for chest pain and leg swelling.  Gastrointestinal: Negative for abdominal pain, nausea and vomiting.  Genitourinary: Negative for decreased urine volume and dysuria.  Musculoskeletal: Negative for arthralgias and myalgias.  Skin: Negative for rash.  Neurological: Negative for dizziness, weakness, numbness  and headaches.  Hematological: Negative for adenopathy.     Physical Exam Updated Vital Signs BP 137/77 (BP Location: Right Arm)   Pulse (!) 108   Temp 99.7 F (37.6 C) (Oral)   Resp 19   Ht 5\' 6"  (1.676 m)   Wt 68 kg   SpO2 96%   BMI 24.21 kg/m   Physical Exam Vitals signs and nursing note reviewed.  Constitutional:      Appearance: Normal appearance. He is not ill-appearing.  HENT:     Right Ear: Tympanic membrane and ear canal normal.     Left Ear: Tympanic membrane and ear canal normal.     Mouth/Throat:     Mouth: Mucous membranes are moist.     Pharynx: No oropharyngeal exudate or posterior oropharyngeal erythema.  Neck:     Musculoskeletal: Normal range of motion.  Cardiovascular:     Rate and Rhythm: Normal rate and regular rhythm.     Pulses: Normal pulses.  Pulmonary:     Effort: Pulmonary effort is normal.     Breath sounds: Normal breath sounds.  Abdominal:     General: There is no distension.     Palpations: Abdomen is soft.     Tenderness: There is no abdominal tenderness. There is no guarding.  Musculoskeletal: Normal range of motion.     Right lower leg: No edema.     Left lower leg: No edema.  Lymphadenopathy:     Cervical: No cervical adenopathy.  Skin:    General: Skin is warm.     Capillary Refill: Capillary refill takes less than 2 seconds.     Findings: No rash.  Neurological:     General: No focal deficit present.     Mental Status: He is alert.     Sensory: No sensory deficit.     Motor: No weakness.      ED Treatments / Results  Labs (all labs ordered are listed, but only abnormal results are displayed) Labs Reviewed  BASIC METABOLIC PANEL - Abnormal; Notable for the following components:      Result Value   Sodium 133 (*)    BUN 7 (*)    All other components within normal limits  CBC WITH DIFFERENTIAL/PLATELET - Abnormal; Notable for the following components:   RBC 4.16 (*)    Hemoglobin 12.7 (*)    All other components  within normal limits  NOVEL CORONAVIRUS, NAA (HOSP ORDER, SEND-OUT TO REF LAB; TAT 18-24 HRS)  D-DIMER, QUANTITATIVE (NOT AT [facility])  TROPONIN I (HIGH SENSITIVITY)  TROPONIN I (HIGH SENSITIVITY)    EKG EKG Interpretation  Date/Time:  Sunday October 05 2018 21:22:53 EDT Ventricular Rate:  109 PR Interval:    QRS Duration: 93 QT Interval:  342 QTC Calculation: 461 R Axis:   -16 Text Interpretation:  Sinus tachycardia Atrial premature complex Borderline left axis deviation Abnormal R-wave progression, late transition Baseline wander in lead(s) I III aVL aVF since last tracing no significant change Confirmed  by Mancel BaleWentz, Elliott [id number]) on 10/05/2018 9:31:09 PM   Radiology Dg Chest Portable 1 View  Result Date: 10/05/2018 CLINICAL DATA:  Productive cough and chest congestion. Low-grade fever. Worsening symptoms for several days. EXAM: PORTABLE CHEST 1 VIEW COMPARISON:  11/21/2017 FINDINGS: Normal sized heart. Clear lungs. Unremarkable bones. IMPRESSION: No acute abnormality. Electronically Signed   By: Beckie SaltsSteven  Reid M.D.   On: 10/05/2018 20:41    Procedures Procedures (including critical care time)  Medications Ordered in ED Medications  albuterol (VENTOLIN HFA) 108 (90 Base) MCG/ACT inhaler 4 puff (4 puffs Inhalation Given 10/05/18 2054)  albuterol (VENTOLIN HFA) 108 (90 Base) MCG/ACT inhaler 2 puff (2 puffs Inhalation Given 10/05/18 2308)  predniSONE (DELTASONE) tablet 40 mg (40 mg Oral Given 10/05/18 2308)     Initial Impression / Assessment and Plan / ED Course  I have reviewed the triage vital signs and the nursing notes.  Pertinent labs & imaging results that were available during my care of the patient were reviewed by me and considered in my medical decision making (see chart for details).       Patient here with some flulike symptoms and cough starting earlier today.  Low-grade fever here.  No hypoxia, tachycardia or tachypnea.  No concerning symptoms for ACS.  X-ray does not  show evidence of pneumonia.  I feel symptoms are likely related to COPD exacerbation.  Patient continues to smoke.  I feel that he is appropriate for discharge home, he agrees to treatment plan with albuterol MDI, doxycycline, and steroids.  COVID test is pending.  He agrees to isolated at home until test results are back.  Return precautions were discussed.   Final Clinical Impressions(s) / ED Diagnoses   Final diagnoses:  COPD exacerbation [facility](HCC)    ED Discharge Orders    None       Pauline Ausriplett, Laquanta Hummel, PA-C 10/06/18 0037    Mancel BaleWentz, Elliott, MD 10/06/18 1004

## 2018-10-06 MED ORDER — PREDNISONE 20 MG PO TABS: 40.0000 mg | ORAL_TABLET | Freq: Every day | ORAL | 0 refills | Status: AC

## 2018-10-06 MED ORDER — DOXYCYCLINE HYCLATE 100 MG PO CAPS: 100.0000 mg | ORAL_CAPSULE | Freq: Two times a day (BID) | ORAL | 0 refills | Status: AC

## 2018-10-06 NOTE — Discharge Instructions (Addendum)
1 to 2 puffs of the albuterol inhaler every 4-6 hours as needed.  Start the prednisone prescription tomorrow.  Take your antibiotics as directed until they are finished.  Follow-up with your primary doctor for recheck, return here for any worsening symptoms.  You have also been tested for COVID.  Please isolate at home until your test results are back.  This may take 2 to 3 days.  You may also review your results on MyChart

## 2018-10-07 LAB — NOVEL CORONAVIRUS, NAA (HOSP ORDER, SEND-OUT TO REF LAB; TAT 18-24 HRS): SARS-CoV-2, NAA: NOT DETECTED

## 2019-01-16 ENCOUNTER — Ambulatory Visit: Payer: Medicare Other | Admitting: Urology

## 2019-03-18 ENCOUNTER — Ambulatory Visit: Payer: Medicare Other | Admitting: Urology

## 2020-05-05 DIAGNOSIS — F32A Depression, unspecified: Secondary | ICD-10-CM | POA: Diagnosis not present

## 2020-05-05 DIAGNOSIS — R569 Unspecified convulsions: Secondary | ICD-10-CM | POA: Diagnosis not present

## 2020-05-05 DIAGNOSIS — Z125 Encounter for screening for malignant neoplasm of prostate: Secondary | ICD-10-CM | POA: Diagnosis not present

## 2020-05-05 DIAGNOSIS — Z0000 Encounter for general adult medical examination without abnormal findings: Secondary | ICD-10-CM | POA: Diagnosis not present

## 2020-05-05 DIAGNOSIS — G40909 Epilepsy, unspecified, not intractable, without status epilepticus: Secondary | ICD-10-CM | POA: Diagnosis not present

## 2020-05-05 DIAGNOSIS — R351 Nocturia: Secondary | ICD-10-CM | POA: Diagnosis not present

## 2020-05-05 DIAGNOSIS — F17210 Nicotine dependence, cigarettes, uncomplicated: Secondary | ICD-10-CM | POA: Diagnosis not present

## 2020-05-05 DIAGNOSIS — F419 Anxiety disorder, unspecified: Secondary | ICD-10-CM | POA: Diagnosis not present

## 2021-05-09 DIAGNOSIS — G40909 Epilepsy, unspecified, not intractable, without status epilepticus: Secondary | ICD-10-CM | POA: Diagnosis not present

## 2021-05-09 DIAGNOSIS — F17210 Nicotine dependence, cigarettes, uncomplicated: Secondary | ICD-10-CM | POA: Diagnosis not present

## 2021-05-09 DIAGNOSIS — F32A Depression, unspecified: Secondary | ICD-10-CM | POA: Diagnosis not present

## 2021-05-09 DIAGNOSIS — Z0000 Encounter for general adult medical examination without abnormal findings: Secondary | ICD-10-CM | POA: Diagnosis not present

## 2021-05-09 DIAGNOSIS — Z1389 Encounter for screening for other disorder: Secondary | ICD-10-CM | POA: Diagnosis not present

## 2021-05-09 DIAGNOSIS — R410 Disorientation, unspecified: Secondary | ICD-10-CM | POA: Diagnosis not present

## 2021-05-09 DIAGNOSIS — M1712 Unilateral primary osteoarthritis, left knee: Secondary | ICD-10-CM | POA: Diagnosis not present

## 2021-05-24 ENCOUNTER — Ambulatory Visit (INDEPENDENT_AMBULATORY_CARE_PROVIDER_SITE_OTHER): Payer: Medicare HMO

## 2021-05-24 ENCOUNTER — Ambulatory Visit: Payer: Medicare HMO | Admitting: Orthopedic Surgery

## 2021-05-24 ENCOUNTER — Encounter: Payer: Self-pay | Admitting: Orthopedic Surgery

## 2021-05-24 VITALS — Ht 66.0 in | Wt 172.0 lb

## 2021-05-24 DIAGNOSIS — G8929 Other chronic pain: Secondary | ICD-10-CM

## 2021-05-24 DIAGNOSIS — M1712 Unilateral primary osteoarthritis, left knee: Secondary | ICD-10-CM

## 2021-05-24 NOTE — Patient Instructions (Signed)

## 2021-05-25 ENCOUNTER — Encounter: Payer: Self-pay | Admitting: Orthopedic Surgery

## 2021-05-25 NOTE — Progress Notes (Signed)
New Patient Visit  Assessment: Marco [name] is a 66 y.o. male with the following: 1. Chronic pain of left knee  Plan: Marco [name] has advanced degenerative changes within his left knee.  He has been dealing with this for several years.  He has had multiple injections in the past.  Steroid injections do not provide significant relief.  He is not interested in surgery at this time.  Based on the appearance of his knee, as well as his limited range of motion with a flexion contracture of at least 15 degrees, I have little to offer him.  We discussed proceeding with a steroid injection in clinic today, and he elected to proceed.  In the future, if he would like to try some hyaluronic acid injections, we will have to make sure that we have authorization.  All questions were answered, and he is amenable to this plan.  Procedure note injection Left knee joint   Verbal consent was obtained to inject the left knee joint  Timeout was completed to confirm the site of injection.  The skin was prepped with alcohol and ethyl chloride was sprayed at the injection site.  A 21-gauge needle was used to inject 40 mg of Depo-Medrol and 1% lidocaine (3 cc) into the left knee using an anterolateral approach.  There were no complications. A sterile bandage was applied.     Follow-up: Return if symptoms worsen or fail to improve.  Subjective:  Chief Complaint  Patient presents with   Knee Pain    Lt knee has been followed in [place], but pain has flared up over the past 3 mos.     History of Present Illness: Marco [name] is a 66 y.o. male who has been referred by  Jerl Mina, MD for evaluation of left knee pain.  He has had pain in the left knee for several years.  No specific injury.  He just relocated to this area, but was previously followed in the clinic and medicine.  He has had injections in the past with limited improvement in his symptoms.  He has had better success with hyaluronic acid  injections.  No physical therapy.   Review of Systems: No fevers or chills No numbness or tingling No chest pain No shortness of breath No bowel or bladder dysfunction No GI distress No headaches   Medical History:  Past Medical History:  Diagnosis Date   Anxiety    Arthritis    COPD (chronic obstructive pulmonary disease) (HCC)    Depression    Heartburn    Seizures (HCC)     Past Surgical History:  Procedure Laterality Date   KNEE ARTHROSCOPY  1975    Family History  Problem Relation Age of Onset   Diabetes Mellitus II Mother    Breast cancer Mother    Kidney disease Neg Hx    Prostate cancer Neg Hx    Social History   Tobacco Use   Smoking status: Every Day    Packs/day: 1.00    Types: Cigarettes   Smokeless tobacco: Never  Vaping Use   Vaping Use: Never used  Substance Use Topics   Alcohol use: Yes    Comment: per patient/ he's an alcoholic, but doens't drink often anymore   Drug use: No    Allergies  Allergen Reactions   Hydrocodone-Acetaminophen Nausea Only and Nausea And Vomiting    No outpatient medications have been marked as taking for the 05/24/21 encounter (Office Visit) with Oliver Barre, MD.  Objective: Ht 5\' 6"  (1.676 m)   Wt 172 lb (78 kg)   BMI 27.76 kg/m   Physical Exam:  General: Elderly male., Alert and oriented., and No acute distress. Gait: Left sided antalgic gait.  Left knee without effusion.  Range of motion from 15-110 degrees.  Crepitus with range of motion testing.  No increased laxity varus valgus stress.  Negative Lachman.  IMAGING: I personally ordered and reviewed the following images  X-rays of the left knee were obtained in clinic today.  Mild valgus alignment overall.  Severe degenerative changes within the patellofemoral joint, with loss of joint space, as well as some bony erosion of the patella.  Decreased joint space within the lateral compartment, with some lateral osteophytes.  Impression:  Advanced left knee degenerative changes   New Medications:  No orders of the defined types were placed in this encounter.     , MD  05/25/2021 4:04 PM

## 2021-06-06 ENCOUNTER — Encounter: Payer: Self-pay | Admitting: Orthopedic Surgery

## 2021-06-06 ENCOUNTER — Ambulatory Visit: Payer: Medicare HMO | Admitting: Orthopedic Surgery

## 2021-06-06 DIAGNOSIS — M25562 Pain in left knee: Secondary | ICD-10-CM

## 2021-06-06 DIAGNOSIS — G8929 Other chronic pain: Secondary | ICD-10-CM

## 2021-06-07 ENCOUNTER — Ambulatory Visit: Payer: Medicare HMO | Admitting: Orthopedic Surgery

## 2021-06-07 NOTE — Progress Notes (Signed)
Return patient Visit  Assessment: Marco [name] is a 66 y.o. male with the following: 1. Chronic pain of left knee  Plan: Marco [name] has severe left knee redness.  It is most prominent within the patellofemoral compartment.  He has bone-on-bone articulation, with erosion of the undersurface of the patella.  He noted a loud pop recently, with some associated pain.  He is low functioning.  He is not interested in consideration for a total knee arthroplasty.  He has had hyaluronic acid injections in the past, and he would like to pursue this.  Once we have authorization, we will schedule appointment.   Follow-up: Return if symptoms worsen or fail to improve, for Return for Hyaluronic acid injections, once authorized.  Subjective:  Chief Complaint  Patient presents with   Knee Pain    LT knee/ had injection at the last visit/ was walking into the kitchen Saturday and they heard a loud pop and patient almost fell to the floor    History of Present Illness: Marco [name] is a 66 y.o. male who returns to clinic for repeat evaluation of his left knee.  I saw him in clinic just a few weeks ago, at which time we injected the left knee with cortisone.  He has had some relief following the injection.  Just a few days ago, he was at home, and both the patient, and family heard a loud popping sensation coming from the left knee.  This also caused him to buckle to the floor.  He has not noticed any swelling since.  The pain has not necessarily changed.  However, the lateral popping was concerning, and has happened intermittently.  Review of Systems: No fevers or chills No numbness or tingling No chest pain No shortness of breath No bowel or bladder dysfunction No GI distress No headaches  Objective: There were no vitals taken for this visit.  Physical Exam:  General: Elderly male., Alert and oriented., and No acute distress. Gait: Left sided antalgic gait.  Left knee without effusion.   Range of motion from 15-110 degrees.  Crepitus with range of motion testing.  No increased laxity varus valgus stress.  Negative Lachman.  IMAGING: I personally ordered and reviewed the following images  No new imaging obtained today.   New Medications:  No orders of the defined types were placed in this encounter.     Oliver Barre, MD  06/07/2021 8:11 AM

## 2021-09-29 DIAGNOSIS — L723 Sebaceous cyst: Secondary | ICD-10-CM | POA: Diagnosis not present

## 2022-03-01 ENCOUNTER — Encounter: Payer: Self-pay | Admitting: Radiology

## 2023-07-09 DIAGNOSIS — H5203 Hypermetropia, bilateral: Secondary | ICD-10-CM | POA: Diagnosis not present

## 2023-12-18 NOTE — Progress Notes (Signed)
 Marco [name]                                          MRN: [id number]   12/18/2023   The [facility] Quality Team Specialist reviewed this patient medical record for the purposes of chart review for care gap closure. The following were reviewed: chart review for care gap closure-colorectal cancer screening.    [facility] Quality Team
